# Patient Record
Sex: Female | Born: 1940 | Race: White | Hispanic: No | Marital: Married | Smoking: Former smoker
Health system: Southern US, Community
[De-identification: ages and names within clinical notes are randomized; demographics above are authoritative.]

## PROBLEM LIST (undated history)

## (undated) DIAGNOSIS — G5600 Carpal tunnel syndrome, unspecified upper limb: Secondary | ICD-10-CM

## (undated) DIAGNOSIS — G4700 Insomnia, unspecified: Secondary | ICD-10-CM

## (undated) DIAGNOSIS — F329 Major depressive disorder, single episode, unspecified: Secondary | ICD-10-CM

## (undated) DIAGNOSIS — J302 Other seasonal allergic rhinitis: Secondary | ICD-10-CM

## (undated) DIAGNOSIS — T7840XA Allergy, unspecified, initial encounter: Secondary | ICD-10-CM

## (undated) DIAGNOSIS — K219 Gastro-esophageal reflux disease without esophagitis: Secondary | ICD-10-CM

## (undated) DIAGNOSIS — N83209 Unspecified ovarian cyst, unspecified side: Secondary | ICD-10-CM

## (undated) DIAGNOSIS — K449 Diaphragmatic hernia without obstruction or gangrene: Secondary | ICD-10-CM

## (undated) DIAGNOSIS — D509 Iron deficiency anemia, unspecified: Secondary | ICD-10-CM

## (undated) DIAGNOSIS — J449 Chronic obstructive pulmonary disease, unspecified: Secondary | ICD-10-CM

## (undated) DIAGNOSIS — M8580 Other specified disorders of bone density and structure, unspecified site: Secondary | ICD-10-CM

## (undated) DIAGNOSIS — M1990 Unspecified osteoarthritis, unspecified site: Secondary | ICD-10-CM

## (undated) DIAGNOSIS — F32A Depression, unspecified: Secondary | ICD-10-CM

## (undated) DIAGNOSIS — E785 Hyperlipidemia, unspecified: Secondary | ICD-10-CM

## (undated) DIAGNOSIS — E559 Vitamin D deficiency, unspecified: Secondary | ICD-10-CM

## (undated) DIAGNOSIS — K5790 Diverticulosis of intestine, part unspecified, without perforation or abscess without bleeding: Secondary | ICD-10-CM

## (undated) DIAGNOSIS — F1010 Alcohol abuse, uncomplicated: Secondary | ICD-10-CM

## (undated) DIAGNOSIS — G2581 Restless legs syndrome: Secondary | ICD-10-CM

## (undated) DIAGNOSIS — R7303 Prediabetes: Secondary | ICD-10-CM

## (undated) DIAGNOSIS — R0600 Dyspnea, unspecified: Secondary | ICD-10-CM

## (undated) DIAGNOSIS — Z973 Presence of spectacles and contact lenses: Secondary | ICD-10-CM

## (undated) HISTORY — DX: Diverticulosis of intestine, part unspecified, without perforation or abscess without bleeding: K57.90

## (undated) HISTORY — PX: UPPER GI ENDOSCOPY: SHX6162

## (undated) HISTORY — DX: Other specified disorders of bone density and structure, unspecified site: M85.80

## (undated) HISTORY — DX: Alcohol abuse, uncomplicated: F10.10

## (undated) HISTORY — DX: Insomnia, unspecified: G47.00

## (undated) HISTORY — DX: Unspecified ovarian cyst, unspecified side: N83.209

## (undated) HISTORY — DX: Depression, unspecified: F32.A

## (undated) HISTORY — PX: BREAST LUMPECTOMY: SHX2

## (undated) HISTORY — DX: Vitamin D deficiency, unspecified: E55.9

## (undated) HISTORY — DX: Hyperlipidemia, unspecified: E78.5

## (undated) HISTORY — PX: APPENDECTOMY: SHX54

## (undated) HISTORY — PX: TONSILLECTOMY: SUR1361

## (undated) HISTORY — PX: KNEE ARTHROPLASTY: SHX992

## (undated) HISTORY — DX: Iron deficiency anemia, unspecified: D50.9

## (undated) HISTORY — PX: CARPAL TUNNEL RELEASE: SHX101

## (undated) HISTORY — DX: Allergy, unspecified, initial encounter: T78.40XA

## (undated) HISTORY — DX: Major depressive disorder, single episode, unspecified: F32.9

## (undated) HISTORY — PX: UPPER GASTROINTESTINAL ENDOSCOPY: SHX188

## (undated) HISTORY — PX: COLONOSCOPY: SHX174

## (undated) HISTORY — DX: Carpal tunnel syndrome, unspecified upper limb: G56.00

## (undated) HISTORY — PX: MASTECTOMY: SHX3

---
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
## 1997-11-20 ENCOUNTER — Other Ambulatory Visit: Admission: RE | Admit: 1997-11-20 | Discharge: 1997-11-20 | Payer: Self-pay | Admitting: Family Medicine

## 1997-11-21 ENCOUNTER — Other Ambulatory Visit: Admission: RE | Admit: 1997-11-21 | Discharge: 1997-11-21 | Payer: Self-pay | Admitting: Family Medicine

## 1997-11-22 ENCOUNTER — Other Ambulatory Visit: Admission: RE | Admit: 1997-11-22 | Discharge: 1997-11-22 | Payer: Self-pay | Admitting: Family Medicine

## 1998-06-09 ENCOUNTER — Ambulatory Visit (HOSPITAL_COMMUNITY): Admission: RE | Admit: 1998-06-09 | Discharge: 1998-06-09 | Payer: Self-pay | Admitting: Gastroenterology

## 1999-11-21 ENCOUNTER — Emergency Department (HOSPITAL_COMMUNITY): Admission: EM | Admit: 1999-11-21 | Discharge: 1999-11-21 | Payer: Self-pay | Admitting: Emergency Medicine

## 1999-11-22 ENCOUNTER — Other Ambulatory Visit (HOSPITAL_COMMUNITY): Admission: RE | Admit: 1999-11-22 | Discharge: 1999-12-19 | Payer: Self-pay | Admitting: Psychiatry

## 2000-04-24 ENCOUNTER — Encounter: Payer: Self-pay | Admitting: Emergency Medicine

## 2000-04-24 ENCOUNTER — Emergency Department (HOSPITAL_COMMUNITY): Admission: EM | Admit: 2000-04-24 | Discharge: 2000-04-24 | Payer: Self-pay | Admitting: Emergency Medicine

## 2001-10-03 ENCOUNTER — Encounter: Payer: Self-pay | Admitting: Internal Medicine

## 2001-10-03 ENCOUNTER — Encounter: Admission: RE | Admit: 2001-10-03 | Discharge: 2001-10-03 | Payer: Self-pay | Admitting: Internal Medicine

## 2001-11-01 ENCOUNTER — Encounter: Payer: Self-pay | Admitting: Orthopedic Surgery

## 2001-11-01 ENCOUNTER — Encounter: Admission: RE | Admit: 2001-11-01 | Discharge: 2001-11-01 | Payer: Self-pay | Admitting: Orthopedic Surgery

## 2002-01-09 ENCOUNTER — Encounter: Payer: Self-pay | Admitting: Orthopedic Surgery

## 2002-01-09 ENCOUNTER — Encounter: Admission: RE | Admit: 2002-01-09 | Discharge: 2002-01-09 | Payer: Self-pay | Admitting: Orthopedic Surgery

## 2002-05-13 ENCOUNTER — Encounter: Admission: RE | Admit: 2002-05-13 | Discharge: 2002-05-13 | Payer: Self-pay | Admitting: Internal Medicine

## 2002-05-13 ENCOUNTER — Encounter: Payer: Self-pay | Admitting: Internal Medicine

## 2003-01-23 ENCOUNTER — Ambulatory Visit (HOSPITAL_COMMUNITY): Admission: RE | Admit: 2003-01-23 | Discharge: 2003-01-23 | Payer: Self-pay | Admitting: Gastroenterology

## 2003-07-04 HISTORY — PX: KNEE SURGERY: SHX244

## 2004-07-03 HISTORY — PX: BUNIONECTOMY: SHX129

## 2004-08-01 ENCOUNTER — Other Ambulatory Visit: Admission: RE | Admit: 2004-08-01 | Discharge: 2004-08-01 | Payer: Self-pay | Admitting: Internal Medicine

## 2004-08-02 ENCOUNTER — Encounter: Admission: RE | Admit: 2004-08-02 | Discharge: 2004-08-02 | Payer: Self-pay | Admitting: Internal Medicine

## 2004-08-18 ENCOUNTER — Encounter: Admission: RE | Admit: 2004-08-18 | Discharge: 2004-08-18 | Payer: Self-pay | Admitting: Internal Medicine

## 2005-07-07 ENCOUNTER — Encounter: Admission: RE | Admit: 2005-07-07 | Discharge: 2005-07-07 | Payer: Self-pay | Admitting: Internal Medicine

## 2005-08-01 ENCOUNTER — Other Ambulatory Visit: Admission: RE | Admit: 2005-08-01 | Discharge: 2005-08-01 | Payer: Self-pay | Admitting: Internal Medicine

## 2005-08-02 ENCOUNTER — Emergency Department (HOSPITAL_COMMUNITY): Admission: EM | Admit: 2005-08-02 | Discharge: 2005-08-02 | Payer: Self-pay | Admitting: Emergency Medicine

## 2006-11-16 ENCOUNTER — Encounter: Admission: RE | Admit: 2006-11-16 | Discharge: 2006-11-16 | Payer: Self-pay | Admitting: *Deleted

## 2007-12-31 ENCOUNTER — Other Ambulatory Visit: Admission: RE | Admit: 2007-12-31 | Discharge: 2007-12-31 | Payer: Self-pay | Admitting: Family Medicine

## 2008-01-07 ENCOUNTER — Encounter: Admission: RE | Admit: 2008-01-07 | Discharge: 2008-01-07 | Payer: Self-pay | Admitting: Family Medicine

## 2009-01-28 ENCOUNTER — Encounter: Admission: RE | Admit: 2009-01-28 | Discharge: 2009-01-28 | Payer: Self-pay | Admitting: Family Medicine

## 2009-02-02 ENCOUNTER — Ambulatory Visit (HOSPITAL_COMMUNITY): Payer: Self-pay | Admitting: Licensed Clinical Social Worker

## 2009-02-10 ENCOUNTER — Ambulatory Visit (HOSPITAL_COMMUNITY): Payer: Self-pay | Admitting: Licensed Clinical Social Worker

## 2009-02-17 ENCOUNTER — Ambulatory Visit (HOSPITAL_COMMUNITY): Payer: Self-pay | Admitting: Licensed Clinical Social Worker

## 2009-03-05 ENCOUNTER — Ambulatory Visit (HOSPITAL_COMMUNITY): Payer: Self-pay | Admitting: Licensed Clinical Social Worker

## 2009-04-12 ENCOUNTER — Ambulatory Visit (HOSPITAL_COMMUNITY): Payer: Self-pay | Admitting: Licensed Clinical Social Worker

## 2009-05-17 ENCOUNTER — Ambulatory Visit (HOSPITAL_COMMUNITY): Payer: Self-pay | Admitting: Licensed Clinical Social Worker

## 2009-06-02 ENCOUNTER — Ambulatory Visit (HOSPITAL_COMMUNITY): Payer: Self-pay | Admitting: Licensed Clinical Social Worker

## 2009-06-30 ENCOUNTER — Emergency Department (HOSPITAL_COMMUNITY): Admission: EM | Admit: 2009-06-30 | Discharge: 2009-06-30 | Payer: Self-pay | Admitting: Emergency Medicine

## 2009-07-07 ENCOUNTER — Ambulatory Visit (HOSPITAL_BASED_OUTPATIENT_CLINIC_OR_DEPARTMENT_OTHER): Admission: RE | Admit: 2009-07-07 | Discharge: 2009-07-07 | Payer: Self-pay | Admitting: Orthopedic Surgery

## 2009-07-07 HISTORY — PX: ANKLE SURGERY: SHX546

## 2009-09-21 ENCOUNTER — Encounter: Admission: RE | Admit: 2009-09-21 | Discharge: 2009-09-21 | Payer: Self-pay | Admitting: *Deleted

## 2010-01-24 ENCOUNTER — Emergency Department (HOSPITAL_COMMUNITY): Admission: EM | Admit: 2010-01-24 | Discharge: 2010-01-24 | Payer: Self-pay | Admitting: Family Medicine

## 2010-01-25 ENCOUNTER — Encounter: Admission: RE | Admit: 2010-01-25 | Discharge: 2010-01-25 | Payer: Self-pay | Admitting: Gastroenterology

## 2010-06-15 ENCOUNTER — Emergency Department (HOSPITAL_COMMUNITY)
Admission: EM | Admit: 2010-06-15 | Discharge: 2010-06-15 | Payer: Self-pay | Source: Home / Self Care | Admitting: Emergency Medicine

## 2010-07-24 ENCOUNTER — Encounter: Payer: Self-pay | Admitting: Internal Medicine

## 2010-09-17 LAB — COMPREHENSIVE METABOLIC PANEL
Alkaline Phosphatase: 62 U/L (ref 39–117)
Calcium: 8.7 mg/dL (ref 8.4–10.5)
GFR calc non Af Amer: >60 mL/min (ref >60)
Glucose, Bld: 117 mg/dL — ABNORMAL HIGH (ref 70–99)
Potassium: 3.4 mEq/L — ABNORMAL LOW (ref 3.5–5.1)
Total Bilirubin: 0.4 mg/dL (ref 0.3–1.2)

## 2010-09-17 LAB — DIFFERENTIAL
Eosinophils Absolute: 0.1 10*3/uL (ref 0.0–0.7)
Lymphocytes Relative: 27 % (ref 12–46)
Monocytes Relative: 12 % (ref 3–12)
Neutrophils Relative %: 59 % (ref 43–77)

## 2010-09-17 LAB — CBC
HCT: 37.2 % (ref 36.0–46.0)
MCH: 29.6 pg (ref 26.0–34.0)
MCHC: 33.1 g/dL (ref 30.0–36.0)
RBC: 4.16 MIL/uL (ref 3.87–5.11)
RDW: 13.3 % (ref 11.5–15.5)

## 2010-09-17 LAB — POCT URINALYSIS DIP (DEVICE)
Protein, ur: 30 mg/dL — AB
Specific Gravity, Urine: >=1.03 (ref 1.005–1.030)
Urobilinogen, UA: 0.2 mg/dL (ref 0.0–1.0)
pH: 5.5 (ref 5.0–8.0)

## 2010-09-17 LAB — CLOSTRIDIUM DIFFICILE EIA

## 2010-09-17 LAB — STOOL CULTURE

## 2010-09-17 LAB — GIARDIA/CRYPTOSPORIDIUM SCREEN(EIA): Giardia Screen - EIA: NEGATIVE

## 2010-12-02 HISTORY — PX: OTHER SURGICAL HISTORY: SHX169

## 2011-07-31 ENCOUNTER — Other Ambulatory Visit: Payer: Self-pay | Admitting: Family Medicine

## 2011-07-31 DIAGNOSIS — Z1231 Encounter for screening mammogram for malignant neoplasm of breast: Secondary | ICD-10-CM

## 2011-08-03 ENCOUNTER — Encounter (HOSPITAL_COMMUNITY): Payer: Self-pay

## 2011-08-03 ENCOUNTER — Emergency Department (INDEPENDENT_AMBULATORY_CARE_PROVIDER_SITE_OTHER)
Admission: EM | Admit: 2011-08-03 | Discharge: 2011-08-03 | Disposition: A | Discharge status: Home / Self Care | Payer: Medicare Other | Source: Home / Self Care

## 2011-08-03 DIAGNOSIS — K122 Cellulitis and abscess of mouth: Secondary | ICD-10-CM

## 2011-08-03 HISTORY — DX: Diaphragmatic hernia without obstruction or gangrene: K44.9

## 2011-08-03 HISTORY — DX: Other seasonal allergic rhinitis: J30.2

## 2011-08-03 HISTORY — DX: Restless legs syndrome: G25.81

## 2011-08-03 HISTORY — DX: Unspecified osteoarthritis, unspecified site: M19.90

## 2011-08-03 LAB — POCT RAPID STREP A: Streptococcus, Group A Screen (Direct): NEGATIVE

## 2011-08-03 MED ORDER — MAGIC MOUTHWASH W/LIDOCAINE: ORAL | Status: DC

## 2011-08-03 MED ORDER — AMOXICILLIN 875 MG PO TABS: 875.0000 mg | ORAL_TABLET | Freq: Two times a day (BID) | ORAL | Status: AC

## 2011-08-03 NOTE — ED Notes (Signed)
C/o swelling, soreness and burning of throat that started around 2 am today.

## 2011-08-03 NOTE — ED Provider Notes (Signed)
History     CSN: [id number]  Arrival date & time 08/03/11  0810   None     Chief Complaint  Patient presents with  . Sore Throat    (Consider location/radiation/quality/duration/timing/severity/associated sxs/prior treatment) HPI Comments: Onset of sore throat last night. Uvula feels swollen. No nasal congestion, but does have problems chronically with post nasal drainage, and reflux.    Past Medical History  Diagnosis Date  . Arthritis   . Restless leg syndrome   . Seasonal allergies   . Hiatal hernia     Past Surgical History  Procedure Date  . Ankle surgery   . Shoulder surgery   . Knee surgery   . Tonsillectomy     No family history on file.  History  Substance Use Topics  . Smoking status: Never Smoker   . Smokeless tobacco: Not on file  . Alcohol Use: No    OB History    Grav Para Term Preterm Abortions TAB SAB Ect Mult Living                  Review of Systems  Constitutional: Negative for fever, chills and fatigue.  HENT: Positive for sore throat. Negative for ear pain, rhinorrhea, postnasal drip and sinus pressure.   Respiratory: Negative for cough, shortness of breath and wheezing.   Cardiovascular: Negative for chest pain.    Allergies  Sulfa antibiotics  Home Medications   Current Outpatient Rx  Name Route Sig Dispense Refill  . ZYRTEC PO Oral Take by mouth.    . IRON PO Oral Take by mouth.    . REQUIP PO Oral Take by mouth.    . SERTRALINE HCL PO Oral Take by mouth.    Marland Kitchen MAGIC MOUTHWASH W/LIDOCAINE  Gargle and spit 5 ml every 4 hrs as needed 60 mL 0  . AMOXICILLIN 875 MG PO TABS Oral Take 1 tablet (875 mg total) by mouth 2 (two) times daily. 20 tablet 0    BP 113/78  Pulse 65  Temp(Src) 98.5 F (36.9 C) (Oral)  Resp 17  SpO2 96%  Physical Exam  Nursing note and vitals reviewed. Constitutional: She appears well-developed and well-nourished. No distress.  HENT:  Head: Normocephalic and atraumatic.  Right Ear: Tympanic  membrane, external ear and ear canal normal.  Left Ear: Tympanic membrane, external ear and ear canal normal.  Nose: Nose normal.  Mouth/Throat: Uvula is midline and mucous membranes are normal. Posterior oropharyngeal edema and posterior oropharyngeal erythema present. No oropharyngeal exudate or tonsillar abscesses.    Neck: Neck supple.  Cardiovascular: Normal rate, regular rhythm and normal heart sounds.   Pulmonary/Chest: Effort normal and breath sounds normal. No respiratory distress.  Lymphadenopathy:    She has no cervical adenopathy.  Neurological: She is alert.  Skin: Skin is warm and dry.  Psychiatric: She has a normal mood and affect.    ED Course  Procedures (including critical care time)   Labs Reviewed  POCT RAPID STREP A ([facility] CARE ONLY)   No results found.   1. Uvulitis       MDM  Rapid strep neg.         Melody Comas, [place] 08/03/11 [phone]

## 2011-08-10 ENCOUNTER — Ambulatory Visit
Admission: RE | Admit: 2011-08-10 | Discharge: 2011-08-10 | Disposition: A | Discharge status: Home / Self Care | Payer: Medicare Other | Source: Ambulatory Visit | Attending: Family Medicine | Admitting: Family Medicine

## 2011-08-10 DIAGNOSIS — Z1231 Encounter for screening mammogram for malignant neoplasm of breast: Secondary | ICD-10-CM

## 2011-08-10 NOTE — ED Provider Notes (Signed)
Medical screening examination/treatment/procedure(s) were performed by non-physician practitioner and as supervising physician I was immediately available for consultation/collaboration.  Alegria Dominique G  D.O.    Randa Spike, MD 08/10/11 [phone]

## 2011-11-14 ENCOUNTER — Other Ambulatory Visit: Payer: Self-pay | Admitting: Gastroenterology

## 2011-11-16 ENCOUNTER — Other Ambulatory Visit (HOSPITAL_COMMUNITY): Payer: Self-pay | Admitting: Gastroenterology

## 2011-11-21 ENCOUNTER — Ambulatory Visit
Admission: RE | Admit: 2011-11-21 | Discharge: 2011-11-21 | Disposition: A | Discharge status: Home / Self Care | Payer: Medicare Other | Source: Ambulatory Visit | Attending: Gastroenterology | Admitting: Gastroenterology

## 2011-11-21 ENCOUNTER — Other Ambulatory Visit: Payer: Self-pay | Admitting: Family Medicine

## 2011-11-21 DIAGNOSIS — R05 Cough: Secondary | ICD-10-CM

## 2011-11-21 DIAGNOSIS — R059 Cough, unspecified: Secondary | ICD-10-CM

## 2011-11-29 ENCOUNTER — Ambulatory Visit (HOSPITAL_COMMUNITY)
Admission: RE | Admit: 2011-11-29 | Discharge: 2011-11-29 | Disposition: A | Discharge status: Home / Self Care | Payer: Medicare Other | Source: Ambulatory Visit | Attending: Gastroenterology | Admitting: Gastroenterology

## 2011-11-29 DIAGNOSIS — R1310 Dysphagia, unspecified: Secondary | ICD-10-CM | POA: Insufficient documentation

## 2011-11-29 NOTE — Procedures (Signed)
Objective Swallowing Evaluation: Modified Barium Swallowing Study  Patient Details  Name: Lindsay Porter MRN: [id number] Date of Birth: 04-Dec-1940  Today's Date: 11/29/2011 Time: 1010-1040 SLP Time Calculation (min): 30 min  Past Medical History:  Past Medical History  Diagnosis Date  . Arthritis   . Restless leg syndrome   . Seasonal allergies   . Hiatal hernia    Past Surgical History:  Past Surgical History  Procedure Date  . Ankle surgery   . Shoulder surgery   . Knee surgery   . Tonsillectomy    HPI:  71 yo female referred by Dr Matthias Hughs for concerns pt may be aspirating due to cough with intake.  Pt reports dysphagia xyears causing her to choke and cough with eating, pt states she even has problems swallowing saliva.  Pt reports coughing and choking with every meal.  Most difficult time of day to eat is in morning.  Sensation of stasis causing pain reported with patient pointing to pharynx and proximal esophagus, requiring pt to wait for it to pass.  Water and saliva/secretions are reportedly difficult for pt to swallow, causing pt to cough and become a "phlegm factory".   Constant sore throat also reported.  Recent PPI prescribed per pt with improvement in nightime heartburn, pt reports she has never taken a PPI before.  PMH (per pt) also + for smoking (quit 4 years ago), allergies, bronchitis Jan 2013 that has now revolved into a chronic cough, RLS (which she states is getting worse) .  Weight has reportedly been constant and she has not ever required the heimlich manuever per pt.         Assessment / Plan / Recommendation Clinical Impression  Dysphagia Diagnosis: Suspected primary esophageal dysphagia;Within Functional Limits Clinical impression: Pt presents with a functional oropharyngeal swallow without aspiration or penetration of any consistency tested.   Suspect pt has a primary esopahgeal dysphagia.  Pt reported sensation of stasis in pharynx and/or "tightness" when  pharynx was completely clear.  Suspect "tightness" may be due to cricopharyngeal hypertension to compensate for known esophageal issues.  Pt appeared  with stasis throughout esophagus (to near T4 region) that did not clear during entire evaluation.  Barium tablet lodged at appearance of narrowing at distal esophagus and did not clear with further boluses of thin barium, pudding, or water.   Pt sensed stasis and  reported fullness and pain at that time, which would cause her to stop intake if consuming po at home.  SLP advised pt to drink warm liquids and that tablet would dissolve.  Pt's aspiration risk appears to be due to known esophageal dysphagia.      Treatment Recommendation       Diet Recommendation  (defer to GI)   Other  Recommendations     Follow Up Recommendations  None    Frequency and Duration        Pertinent Vitals/Pain n/a    SLP Swallow Goals     General Date of Onset: 11/29/11 HPI: 71 yo female referred by Dr Matthias Hughs for concerns pt may be aspirating due to cough with intake.  Pt reports dysphagia xyears causing her to choke and cough with eating, pt states she even has problems swallowing saliva.  Pt reports coughing and choking with every meal.  Most difficult time of day to eat is in morning.  Sensation of stasis causing pain reported with patient pointing to pharynx and proximal esophagus, requiring pt to wait for it to pass.  Water and saliva/secretions are reportedly difficult for pt to swallow, causing pt to cough and become a "phlegm factory".   Constant sore throat also reported.  Recent PPI prescribed per pt with improvement in nightime heartburn, pt reports she has never taken a PPI before.  PMH (per pt) also + for smoking (quit 4 years ago), allergies, bronchitis Jan 2013 that has now revolved into a chronic cough, RLS (which she states is getting worse) .  Weight has reportedly been constant and she has not ever required the heimlich manuever per pt.      Type of  Study: Modified Barium Swallowing Study Previous Swallow Assessment: Esophagram 11/21/11:  significant esophageal dysmotility with spasm distally and standing wave contractions, large hiatal hernia with organoaxial volvulus, prominent cp impression but no Zenker's   Esophagram 01/25/10 moderate sized hiatal hernia, barium tablet lodged temporarily in distal esophagus but did pass into hernia without delay, food debris does pass from hernia into a more distal stomach without evidence of significant obstruction, moderate tertiary contractions in distal esophagus, slight narrowing of lower cervical esophagus without defiinite stricture.   Diet Prior to this Study: Regular;Thin liquids Temperature Spikes Noted: No Respiratory Status: Room air History of Intubation: No Behavior/Cognition: Alert;Cooperative;Pleasant mood Oral Cavity - Dentition: Adequate natural dentition Oral Motor / Sensory Function: Within functional limits Vision: Functional for self-feeding Patient Positioning: Upright in chair Baseline Vocal Quality: Clear Volitional Cough: Strong Anatomy: Within functional limits Pharyngeal Secretions: Not observed secondary MBS    Reason for Referral Concern for aspiration  Oral Phase   Oral Phase:  WFL  Pharyngeal Phase Pharyngeal Phase: Within functional limits   Cervical Esophageal Phase Cervical Esophageal Phase: Impaired    Chales Abrahams 11/29/2011, 1:01 PM

## 2011-12-04 ENCOUNTER — Encounter: Payer: Self-pay | Admitting: Pulmonary Disease

## 2011-12-04 DIAGNOSIS — M1990 Unspecified osteoarthritis, unspecified site: Secondary | ICD-10-CM | POA: Insufficient documentation

## 2011-12-04 DIAGNOSIS — K5790 Diverticulosis of intestine, part unspecified, without perforation or abscess without bleeding: Secondary | ICD-10-CM | POA: Insufficient documentation

## 2011-12-04 DIAGNOSIS — F32A Depression, unspecified: Secondary | ICD-10-CM | POA: Insufficient documentation

## 2011-12-04 DIAGNOSIS — M8580 Other specified disorders of bone density and structure, unspecified site: Secondary | ICD-10-CM | POA: Insufficient documentation

## 2011-12-04 DIAGNOSIS — G5600 Carpal tunnel syndrome, unspecified upper limb: Secondary | ICD-10-CM | POA: Insufficient documentation

## 2011-12-04 DIAGNOSIS — F329 Major depressive disorder, single episode, unspecified: Secondary | ICD-10-CM | POA: Insufficient documentation

## 2011-12-04 DIAGNOSIS — E559 Vitamin D deficiency, unspecified: Secondary | ICD-10-CM | POA: Insufficient documentation

## 2011-12-04 DIAGNOSIS — K449 Diaphragmatic hernia without obstruction or gangrene: Secondary | ICD-10-CM | POA: Insufficient documentation

## 2011-12-04 DIAGNOSIS — G4700 Insomnia, unspecified: Secondary | ICD-10-CM | POA: Insufficient documentation

## 2011-12-04 DIAGNOSIS — E785 Hyperlipidemia, unspecified: Secondary | ICD-10-CM | POA: Insufficient documentation

## 2011-12-04 DIAGNOSIS — F1010 Alcohol abuse, uncomplicated: Secondary | ICD-10-CM | POA: Insufficient documentation

## 2011-12-04 DIAGNOSIS — D509 Iron deficiency anemia, unspecified: Secondary | ICD-10-CM | POA: Insufficient documentation

## 2011-12-04 DIAGNOSIS — J302 Other seasonal allergic rhinitis: Secondary | ICD-10-CM | POA: Insufficient documentation

## 2011-12-04 DIAGNOSIS — G2581 Restless legs syndrome: Secondary | ICD-10-CM | POA: Insufficient documentation

## 2011-12-05 ENCOUNTER — Encounter: Payer: Self-pay | Admitting: Pulmonary Disease

## 2011-12-05 ENCOUNTER — Ambulatory Visit (INDEPENDENT_AMBULATORY_CARE_PROVIDER_SITE_OTHER): Payer: Medicare Other | Admitting: Pulmonary Disease

## 2011-12-05 VITALS — BP 108/78 | HR 68 | Temp 98.2°F | Ht 65.0 in | Wt 160.2 lb

## 2011-12-05 DIAGNOSIS — R0989 Other specified symptoms and signs involving the circulatory and respiratory systems: Secondary | ICD-10-CM

## 2011-12-05 DIAGNOSIS — J449 Chronic obstructive pulmonary disease, unspecified: Secondary | ICD-10-CM | POA: Insufficient documentation

## 2011-12-05 DIAGNOSIS — R0600 Dyspnea, unspecified: Secondary | ICD-10-CM

## 2011-12-05 NOTE — Assessment & Plan Note (Signed)
The patient is describing dyspnea that primarily occurs while sitting, talking, lying down, and bending over.  She otherwise has fairly good exertional tolerance for walking and also working out in the gym.  This would be most consistent with some type of restrictive process such as her centripetal obesity and large hiatal hernia in her chest.  She does have a long-standing history of smoking, and may have a component of COPD.  She has never had pulmonary function studies, and will arrange for her to have these.  We'll see her back after her PFTs.  Ultimately, her symptoms of cough and dyspnea may resolve after her hiatal hernia repair?

## 2011-12-05 NOTE — Patient Instructions (Signed)
No change in breathing medications for now Will set up for breathing studies next week to see how much COPD you may have, and also to evaluate for restrictive disease as we discussed. I suspect your cough is coming from reflux/hiatal hernia, and would keep followup with GI.

## 2011-12-05 NOTE — Progress Notes (Signed)
Addended by: Michel Bickers A on: 12/05/2011 03:53 PM   Modules accepted: Orders

## 2011-12-05 NOTE — Progress Notes (Signed)
  Subjective:    Patient ID: Lindsay Porter, female    DOB: [birth year], 71 y.o.   MRN: [id number]  HPI The patient is a 71 year old female who I've been asked to see for dyspnea on exertion.  She has noted shortness of breath for at least the last 3 years, and thinks that it is getting worse.  This primarily occurs while sitting and trying to talk, or after lying down.  It also occurs frequently when bending over to pick up something or tie shoes.  Contrary to this, the patient feels that she can walk moderate distances without any significant shortness of breath.  She will not get winded walking up a flight of stairs, perking groceries in from the car, or doing her housework.  She currently is on symbicort and Singulair, but has never had pulmonary function studies.  She has had a recent chest x-ray that shows a large hiatal hernia and minimal scarring in the bases.  This was followed up with an esophagram which showed esophageal dysmotility and a large hiatal hernia.  There was no aspiration per speech evaluation.  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  She is being followed closely by gastroenterology, with a pending decision regarding possible surgery.   Review of Systems  Constitutional: Negative.  Negative for fever and unexpected weight change.  HENT: Positive for sore throat and trouble swallowing. Negative for ear pain, nosebleeds, congestion, rhinorrhea, sneezing, dental problem, postnasal drip and sinus pressure.   Eyes: Negative.  Negative for redness and itching.  Respiratory: Positive for cough and shortness of breath. Negative for chest tightness and wheezing.   Cardiovascular: Positive for chest pain. Negative for palpitations and leg swelling.  Gastrointestinal: Negative for nausea and vomiting.       Heartburn   Genitourinary: Negative.  Negative for dysuria.  Musculoskeletal:  Positive for arthralgias. Negative for joint swelling.  Skin: Positive for rash.       Itching   Neurological: Positive for headaches.  Hematological: Does not bruise/bleed easily.  Psychiatric/Behavioral: Negative.  Negative for dysphoric mood. The patient is not nervous/anxious.        Objective:   Physical Exam Constitutional:  Well developed, no acute distress  HENT:  Nares patent without discharge, but septal deviation to left with narrowing.   Oropharynx without exudate, palate and uvula are normal  Eyes:  Perrla, eomi, no scleral icterus  Neck:  No JVD, no TMG  Cardiovascular:  Normal rate, regular rhythm, no rubs or gallops.  No murmurs        Intact distal pulses  Pulmonary :  Normal breath sounds, no stridor or respiratory distress   No rales, rhonchi, or wheezing  Abdominal:  Soft, nondistended, bowel sounds present.  No tenderness noted.   Musculoskeletal:  Minimal lower extremity edema noted.  Lymph Nodes:  No cervical lymphadenopathy noted  Skin:  No cyanosis noted  Neurologic:  Alert, appropriate, moves all 4 extremities without obvious deficit.         Assessment & Plan:

## 2011-12-06 ENCOUNTER — Institutional Professional Consult (permissible substitution): Payer: Medicare Other | Admitting: Internal Medicine

## 2011-12-14 ENCOUNTER — Ambulatory Visit (INDEPENDENT_AMBULATORY_CARE_PROVIDER_SITE_OTHER): Payer: Medicare Other | Admitting: Pulmonary Disease

## 2011-12-14 DIAGNOSIS — R0989 Other specified symptoms and signs involving the circulatory and respiratory systems: Secondary | ICD-10-CM

## 2011-12-14 DIAGNOSIS — R0600 Dyspnea, unspecified: Secondary | ICD-10-CM

## 2011-12-14 LAB — PULMONARY FUNCTION TEST

## 2011-12-14 NOTE — Progress Notes (Signed)
PFT done today. 

## 2011-12-18 ENCOUNTER — Telehealth: Payer: Self-pay | Admitting: Pulmonary Disease

## 2011-12-18 NOTE — Telephone Encounter (Signed)
Per KC okay to use RN slot for pt. lmomtcb x1 for pt to get pt scheduled for OV with [facility]

## 2011-12-18 NOTE — Telephone Encounter (Signed)
Pt is requesting her PFT results from 12/14/11. Please advise KC thanks

## 2011-12-18 NOTE — Telephone Encounter (Signed)
Let pt know that she has moderate copd by her breathing studies, and I would like for her to come in to discuss treatment and get started on samples.

## 2011-12-19 NOTE — Telephone Encounter (Signed)
Pt returned call.  Advised of PFT results and KC's recs to come in for ov to discuss treatment and start samples.  OV with KC 6.28.13 @ 4:15pm.  Pt okay with this date and time.  Pt to call sooner if needed.  Will sign off.

## 2011-12-29 ENCOUNTER — Ambulatory Visit (INDEPENDENT_AMBULATORY_CARE_PROVIDER_SITE_OTHER): Payer: Medicare Other | Admitting: Pulmonary Disease

## 2011-12-29 ENCOUNTER — Encounter: Payer: Self-pay | Admitting: Pulmonary Disease

## 2011-12-29 VITALS — BP 104/66 | HR 66 | Temp 97.8°F | Ht 65.0 in | Wt 162.0 lb

## 2011-12-29 DIAGNOSIS — J449 Chronic obstructive pulmonary disease, unspecified: Secondary | ICD-10-CM

## 2011-12-29 NOTE — Assessment & Plan Note (Signed)
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  I've had a long discussion with her and her family regarding the pathophysiology of emphysema, and how we typically approach this disease process.  She is on a good bronchodilator currently, and seems to have had improvement with this.  I discussed escalation of therapy with her if she does not feel that she is satisfied with her quality of life/exertional tolerance.  If this is the case, I would add Spiriva to her regimen.  I also discussed with her the role of weight loss and conditioning, as well as vaccines.  It is unclear to me how much of her chest pressure is secondary to air trapping from her COPD, versus her hiatal hernia and reflux.

## 2011-12-29 NOTE — Progress Notes (Signed)
  Subjective:    Patient ID: Lindsay Porter, female    DOB: 1941-05-24, 71 y.o.   MRN: [id number]  HPI The patient comes in today for followup after her pulmonary function studies for evaluation of dyspnea and chest fullness.  She was found to have moderate airflow obstruction, positive air trapping, no restriction, and a moderate decrease in diffusion capacity.  I have reviewed this study with her in great detail and her family as well, and answered all their questions.   Review of Systems  Constitutional: Negative for fever and unexpected weight change.  HENT: Positive for voice change. Negative for ear pain, nosebleeds, congestion, sore throat, rhinorrhea, sneezing, trouble swallowing, dental problem, postnasal drip and sinus pressure.        Hoarseness  Eyes: Negative for redness and itching.  Respiratory: Negative for cough, chest tightness, shortness of breath and wheezing.   Cardiovascular: Negative for palpitations and leg swelling.  Gastrointestinal: Negative for nausea and vomiting.  Genitourinary: Negative for dysuria.  Musculoskeletal: Negative for joint swelling.  Skin: Negative for rash.  Neurological: Negative for headaches.  Hematological: Does not bruise/bleed easily.  Psychiatric/Behavioral: Negative for dysphoric mood. The patient is not nervous/anxious.   All other systems reviewed and are negative.       Objective:   Physical Exam Overweight female in no acute distress Nose without  Purulence or discharge noted Chest with mildly decreased breath sounds, but no wheezing Lower extremities with no significant edema, no cyanosis Alert and oriented, moves all 4 extremities.       Assessment & Plan:

## 2011-12-29 NOTE — Patient Instructions (Addendum)
Stay on symbicort twice a day, and keep mouth rinsed. Work on [employer] reduction and conditioning program.  If you feel it is difficult on your own, consider referral to pulmonary rehab program at [facility]. Make sure you get your flu shot this fall. followup with me in 6mos to check on your progress.

## 2012-01-12 ENCOUNTER — Encounter: Payer: Self-pay | Admitting: Pulmonary Disease

## 2012-03-15 ENCOUNTER — Other Ambulatory Visit: Payer: Self-pay | Admitting: Family Medicine

## 2012-03-15 DIAGNOSIS — Z1231 Encounter for screening mammogram for malignant neoplasm of breast: Secondary | ICD-10-CM

## 2012-03-15 DIAGNOSIS — Z780 Asymptomatic menopausal state: Secondary | ICD-10-CM

## 2012-03-21 ENCOUNTER — Ambulatory Visit (INDEPENDENT_AMBULATORY_CARE_PROVIDER_SITE_OTHER): Payer: Medicare Other | Admitting: General Surgery

## 2012-03-26 ENCOUNTER — Ambulatory Visit (INDEPENDENT_AMBULATORY_CARE_PROVIDER_SITE_OTHER): Payer: Medicare Other | Admitting: General Surgery

## 2012-03-26 VITALS — BP 110/72 | HR 72 | Temp 97.8°F | Resp 18 | Ht 65.5 in | Wt 157.8 lb

## 2012-03-26 DIAGNOSIS — K449 Diaphragmatic hernia without obstruction or gangrene: Secondary | ICD-10-CM

## 2012-03-26 NOTE — Progress Notes (Signed)
Subjective:   hiatal hernia, dysphagia, shortness of breath  Patient ID: Lindsay Porter, female   DOB: 11/18/1940, 71 y.o.   MRN: [id number]  HPI  Patient is a very pleasant 71-year-old female referred by Dr. Buccini for consideration for surgical repair of a large hiatal hernia. The patient states that she has had a known hiatal hernia for approximately 20 years. She initially had some chest pain and underwent a cardiac workup years ago and then GI workup which showed a hiatal hernia. She initially had some reflux symptoms but these were controlled with medications. In recent years she has had increasing trouble with dysphagia. She describes frequent episodes where she needs a small amount and gets quite a bit of severe pressure and pain in her lower chest. She will sometimes have regurgitation. She also has had some increasing shortness of breath. She has seen Dr. Clance and has had pulmonary function studies and has been diagnosed with moderate COPD. She is an ex-smoker quit 4 years ago. Her shortness of breath by her history seems to occur lot when she bends over but not so much when she walks and she is able to walk a number of blocks. She does not really have any classic heartburn symptoms. She does have some chronic hoarseness and will often have a nighttime cough. She has been thoroughly evaluated by Dr. Buccini including upper endoscopy which showed a large hiatal hernia. She has had an upper GI series performed in May which I personally reviewed. This shows a large hiatal hernia with about half of the stomach above the diaphragm and organoaxial portion with significant twisting of the stomach and some pooling of contrast in the upper stomach. Also noted was the esophageal dysmotility. She recently has been on a combination of Protonix and Zegerid and has had some significant improvement in her symptoms but still bothering her.  Past Medical History  Diagnosis Date  . Arthritis   . Restless leg  syndrome   . Seasonal allergies   . Hiatal hernia   . Hyperlipidemia   . Insomnia   . Depression   . Diverticulosis   . Ovarian cyst   . Vitamin d deficiency   . Osteopenia   . Carpal tunnel syndrome   . Alcohol abuse   . Anemia, iron deficiency    Past Surgical History  Procedure Date  . Ankle surgery 07/07/2009  . Shoulder surgery   . Knee surgery   . Tonsillectomy   . Appendectomy   . Bunionectomy   . Breast lumpectomy   . Nose surgery 12/2010   Current Outpatient Prescriptions  Medication Sig Dispense Refill  . budesonide-formoterol (SYMBICORT) 80-4.5 MCG/ACT inhaler Inhale 2 puffs into the lungs 2 (two) times daily.      . diphenhydrAMINE (BENADRYL) 25 MG tablet Take 25 mg by mouth every 6 (six) hours as needed.      . EPINEPHrine (EPIPEN) 0.3 mg/0.3 mL DEVI Inject 0.3 mg into the muscle once.      . ergocalciferol (VITAMIN D2) 50000 UNITS capsule Take 50,000 Units by mouth once a week.      . fexofenadine (ALLEGRA) 180 MG tablet Take 180 mg by mouth daily.      . fluticasone (FLONASE) 50 MCG/ACT nasal spray 1 spray in each nostril daily      . IRON PO Take 325 mg by mouth daily.       . omeprazole-sodium bicarbonate (ZEGERID) 40-1100 MG per capsule 1 by mouth daily      .   pantoprazole (PROTONIX) 40 MG tablet Once daily      . ROPINIRole HCl (REQUIP PO) Take 2 mg by mouth 2 (two) times daily.       . SERTRALINE HCL PO Take 100 mg by mouth daily.        Allergies  Allergen Reactions  . Sulfa Antibiotics    History  Substance Use Topics  . Smoking status: Former Smoker -- 4.5 packs/day for 45 years    Types: Cigarettes    Quit date: 10/02/2007  . Smokeless tobacco: Never Used  . Alcohol Use: Yes     quit in 2011    Review of Systems  Respiratory: Positive for shortness of breath. Negative for wheezing and stridor.   Cardiovascular: Negative.   Gastrointestinal: Positive for diarrhea. Negative for nausea, abdominal pain, constipation, blood in stool and  abdominal distention.  Musculoskeletal: Positive for arthralgias.       Objective:   Physical Exam Exam BP 110/72  Pulse 72  Temp 97.8 F (36.6 C) (Temporal)  Resp 18  Ht 5' 5.5" (1.664 m)  Wt 157 lb 12.8 oz (71.578 kg)  BMI 25.86 kg/m2  General: Alert, well-developed Caucasian female, in no distress Skin: Warm and dry without rash or infection. HEENT: No palpable masses or thyromegaly. Sclera nonicteric. Pupils equal round and reactive. Oropharynx clear. Lymph nodes: No cervical, supraclavicular, or inguinal nodes palpable. Lungs:mild kyphosis there Breath sounds clear and equal, slightly decreased, without increased work of breathing Cardiovascular: Regular rate and rhythm without murmur. No JVD or edema. Peripheral pulses intact. Abdomen: Nondistended. Soft and nontender. No masses palpable. No organomegaly. No palpable hernias. Extremities: No edema or joint swelling or deformity. No chronic venous stasis changes. Neurologic: Alert and fully oriented. Gait normal.      Assessment:     71-year-old female with large hiatal hernia with organoaxial volvulus. She has had significant dysphagia symptoms as well as what sounds like some occasional nighttime aspiration. She does not have typical reflux symptoms. She has shortness of breath which seems to be more related to bending of exertional possibly related to abdominal pressure and contributed to by her hernia. She also has some degree of esophageal dysmotility on her upper GI series. All in all her hernia appears very significant and I think she could expect some significant symptom relief with repair of her hiatal hernia. The degree to which it would improve her dysphagia is a little bit in question because of her esophageal dysmotility and it is not exactly clear how much her COPD versus her hernia contributes to her shortness of breath. This was all discussed with her and her family. They would like to go ahead with repair and I  think this is very reasonable. I discussed laparoscopic repair. We discussed use of biologic mesh. Risks of surgery including visceral injury, anesthetic risks, bleeding, infection and long-term complications including recurrence, stricture and gas bloat were discussed. Due to her esophageal dysmotility and minimal reflux symptoms I would suggest a partial wrap.    Plan:     Laparoscopic repair of large hiatal hernia with partial fundoplication.      

## 2012-04-11 ENCOUNTER — Encounter (INDEPENDENT_AMBULATORY_CARE_PROVIDER_SITE_OTHER): Payer: Self-pay

## 2012-04-11 ENCOUNTER — Telehealth (INDEPENDENT_AMBULATORY_CARE_PROVIDER_SITE_OTHER): Payer: Self-pay

## 2012-04-11 ENCOUNTER — Encounter (HOSPITAL_COMMUNITY): Payer: Self-pay | Admitting: Pharmacy Technician

## 2012-04-11 NOTE — Telephone Encounter (Signed)
Pt called this morning with questions about her pain relievers.  She is currently being treated by a hand surgeon and is having a lot of pain and swelling in her hand.  She had a cortisone injection on 9/27, but states it hasn't helped very much.  She is concerned about taking any NSAIDS because she is scheduled for hernia repair on 04/19/30.  I told her she could take the Ibuprofen through this coming Saturday, and then switch to Tylenol until her surgery.

## 2012-04-15 NOTE — Patient Instructions (Signed)
22 Lindsay Porter  04/15/2012   Your procedure is scheduled on:  04/19/12 0730am-1000am  Report to [facility] at 0530 AM.  Call this number if you have problems the morning of surgery: [phone]   Remember:   Do not eat food:After Midnight.  May have clear liquids:until Midnight .    Take these medicines the morning of surgery with A SIP OF WATER:    Do not wear jewelry, make-up or nail polish.  Do not wear lotions, powders, or perfumes.   Do not shave 48 hours prior to surgery  Do not bring valuables to the hospital.  Contacts, dentures or bridgework may not be worn into surgery.  Leave suitcase in the car. After surgery it may be brought to your room.  For patients admitted to the hospital, checkout time is 11:00 AM the day of discharge.                SEE CHG INSTRUCTION SHEET    Please read over the following fact sheets that you were given: MRSA Information, coughing and deep breathin exercises, leg exercises

## 2012-04-16 ENCOUNTER — Encounter (HOSPITAL_COMMUNITY): Payer: Self-pay

## 2012-04-16 ENCOUNTER — Encounter (HOSPITAL_COMMUNITY)
Admission: RE | Admit: 2012-04-16 | Discharge: 2012-04-16 | Disposition: A | Discharge status: Home / Self Care | Payer: Medicare Other | Source: Ambulatory Visit | Attending: General Surgery | Admitting: General Surgery

## 2012-04-16 HISTORY — DX: Gastro-esophageal reflux disease without esophagitis: K21.9

## 2012-04-16 HISTORY — DX: Chronic obstructive pulmonary disease, unspecified: J44.9

## 2012-04-16 LAB — COMPREHENSIVE METABOLIC PANEL
ALT: 13 U/L (ref 0–35)
AST: 17 U/L (ref 0–37)
Albumin: 3.4 g/dL — ABNORMAL LOW (ref 3.5–5.2)
Alkaline Phosphatase: 52 U/L (ref 39–117)
Chloride: 103 mEq/L (ref 96–112)
Potassium: 3.8 mEq/L (ref 3.5–5.1)
Sodium: 139 mEq/L (ref 135–145)
Total Bilirubin: 0.2 mg/dL — ABNORMAL LOW (ref 0.3–1.2)
Total Protein: 6.3 g/dL (ref 6.0–8.3)

## 2012-04-16 LAB — CBC
Hemoglobin: 12.4 g/dL (ref 12.0–15.0)
MCH: 29.6 pg (ref 26.0–34.0)
RBC: 4.19 MIL/uL (ref 3.87–5.11)
WBC: 6.6 10*3/uL (ref 4.0–10.5)

## 2012-04-16 LAB — SURGICAL PCR SCREEN
MRSA, PCR: NEGATIVE
Staphylococcus aureus: NEGATIVE

## 2012-04-16 NOTE — Progress Notes (Signed)
Patient to try CHG soap as trial to foot to see if she has a reaction or not .  She will notify nurse if has a reaction or not .  Nurse opted for her to use only hyposensitive soap at home.  Patient wanted to try the CHG.  Bottle of CHG given to patient to try on test area.

## 2012-04-16 NOTE — Progress Notes (Signed)
Patient to wear braces on hands day of surgery.  Nurse told patient that she would be unable to wear into operating room unless someone allows.

## 2012-04-19 ENCOUNTER — Encounter (HOSPITAL_COMMUNITY): Payer: Self-pay | Admitting: Anesthesiology

## 2012-04-19 ENCOUNTER — Inpatient Hospital Stay (HOSPITAL_COMMUNITY)
Admission: RE | Admit: 2012-04-19 | Discharge: 2012-04-22 | DRG: 327 | Disposition: A | Discharge status: Home / Self Care | Payer: Medicare Other | Source: Ambulatory Visit | Attending: General Surgery | Admitting: General Surgery

## 2012-04-19 ENCOUNTER — Ambulatory Visit (HOSPITAL_COMMUNITY): Payer: Medicare Other | Admitting: Anesthesiology

## 2012-04-19 ENCOUNTER — Encounter (HOSPITAL_COMMUNITY): Payer: Self-pay | Admitting: *Deleted

## 2012-04-19 ENCOUNTER — Encounter (HOSPITAL_COMMUNITY)
Admission: RE | Disposition: A | Discharge status: Home / Self Care | Payer: Self-pay | Source: Ambulatory Visit | Attending: General Surgery

## 2012-04-19 DIAGNOSIS — E559 Vitamin D deficiency, unspecified: Secondary | ICD-10-CM | POA: Diagnosis present

## 2012-04-19 DIAGNOSIS — G2581 Restless legs syndrome: Secondary | ICD-10-CM | POA: Diagnosis present

## 2012-04-19 DIAGNOSIS — K449 Diaphragmatic hernia without obstruction or gangrene: Secondary | ICD-10-CM

## 2012-04-19 DIAGNOSIS — K219 Gastro-esophageal reflux disease without esophagitis: Secondary | ICD-10-CM | POA: Diagnosis present

## 2012-04-19 DIAGNOSIS — Z23 Encounter for immunization: Secondary | ICD-10-CM

## 2012-04-19 DIAGNOSIS — K319 Disease of stomach and duodenum, unspecified: Secondary | ICD-10-CM | POA: Diagnosis present

## 2012-04-19 DIAGNOSIS — J90 Pleural effusion, not elsewhere classified: Secondary | ICD-10-CM | POA: Diagnosis not present

## 2012-04-19 DIAGNOSIS — Z8719 Personal history of other diseases of the digestive system: Secondary | ICD-10-CM

## 2012-04-19 DIAGNOSIS — D509 Iron deficiency anemia, unspecified: Secondary | ICD-10-CM | POA: Diagnosis present

## 2012-04-19 DIAGNOSIS — Z9089 Acquired absence of other organs: Secondary | ICD-10-CM

## 2012-04-19 DIAGNOSIS — M949 Disorder of cartilage, unspecified: Secondary | ICD-10-CM | POA: Diagnosis present

## 2012-04-19 DIAGNOSIS — Z79899 Other long term (current) drug therapy: Secondary | ICD-10-CM

## 2012-04-19 DIAGNOSIS — J449 Chronic obstructive pulmonary disease, unspecified: Secondary | ICD-10-CM | POA: Diagnosis present

## 2012-04-19 DIAGNOSIS — Z882 Allergy status to sulfonamides status: Secondary | ICD-10-CM

## 2012-04-19 DIAGNOSIS — E785 Hyperlipidemia, unspecified: Secondary | ICD-10-CM | POA: Diagnosis present

## 2012-04-19 DIAGNOSIS — K224 Dyskinesia of esophagus: Secondary | ICD-10-CM | POA: Diagnosis present

## 2012-04-19 DIAGNOSIS — J4489 Other specified chronic obstructive pulmonary disease: Secondary | ICD-10-CM | POA: Diagnosis present

## 2012-04-19 DIAGNOSIS — M129 Arthropathy, unspecified: Secondary | ICD-10-CM | POA: Diagnosis present

## 2012-04-19 DIAGNOSIS — F1010 Alcohol abuse, uncomplicated: Secondary | ICD-10-CM | POA: Diagnosis present

## 2012-04-19 DIAGNOSIS — F329 Major depressive disorder, single episode, unspecified: Secondary | ICD-10-CM | POA: Diagnosis present

## 2012-04-19 DIAGNOSIS — M899 Disorder of bone, unspecified: Secondary | ICD-10-CM | POA: Diagnosis present

## 2012-04-19 DIAGNOSIS — G5600 Carpal tunnel syndrome, unspecified upper limb: Secondary | ICD-10-CM | POA: Diagnosis present

## 2012-04-19 DIAGNOSIS — J309 Allergic rhinitis, unspecified: Secondary | ICD-10-CM | POA: Diagnosis present

## 2012-04-19 DIAGNOSIS — Z87891 Personal history of nicotine dependence: Secondary | ICD-10-CM

## 2012-04-19 DIAGNOSIS — F3289 Other specified depressive episodes: Secondary | ICD-10-CM | POA: Diagnosis present

## 2012-04-19 DIAGNOSIS — K440 Diaphragmatic hernia with obstruction, without gangrene: Principal | ICD-10-CM | POA: Diagnosis present

## 2012-04-19 HISTORY — PX: HIATAL HERNIA REPAIR: SHX195

## 2012-04-19 SURGERY — REPAIR, HERNIA, HIATAL, LAPAROSCOPIC
Anesthesia: Choice | Site: Esophagus | Wound class: Clean

## 2012-04-19 MED ORDER — LIDOCAINE HCL 1 % IJ SOLN
INTRAMUSCULAR | Status: DC | PRN
  Administered 2012-04-19: 50 mg via INTRADERMAL

## 2012-04-19 MED ORDER — HYDROMORPHONE HCL PF 1 MG/ML IJ SOLN
INTRAMUSCULAR | Status: AC
  Filled 2012-04-19: qty 1

## 2012-04-19 MED ORDER — HYDROMORPHONE HCL PF 1 MG/ML IJ SOLN
INTRAMUSCULAR | Status: DC | PRN
  Administered 2012-04-19: 1 mg via INTRAVENOUS

## 2012-04-19 MED ORDER — ROCURONIUM BROMIDE 100 MG/10ML IV SOLN
INTRAVENOUS | Status: DC | PRN
  Administered 2012-04-19 (×2): 10 mg via INTRAVENOUS
  Administered 2012-04-19: 40 mg via INTRAVENOUS

## 2012-04-19 MED ORDER — GLYCOPYRROLATE 0.2 MG/ML IJ SOLN
INTRAMUSCULAR | Status: DC | PRN
  Administered 2012-04-19: 0.4 mg via INTRAVENOUS

## 2012-04-19 MED ORDER — ONDANSETRON HCL 4 MG/2ML IJ SOLN
INTRAMUSCULAR | Status: DC | PRN
  Administered 2012-04-19: 4 mg via INTRAVENOUS

## 2012-04-19 MED ORDER — LACTATED RINGERS IV SOLN: INTRAVENOUS | Status: DC

## 2012-04-19 MED ORDER — LACTATED RINGERS IV SOLN
INTRAVENOUS | Status: DC | PRN
  Administered 2012-04-19 (×3): via INTRAVENOUS

## 2012-04-19 MED ORDER — PROMETHAZINE HCL 25 MG/ML IJ SOLN: 6.2500 mg | INTRAMUSCULAR | Status: DC | PRN

## 2012-04-19 MED ORDER — BUPIVACAINE-EPINEPHRINE 0.25% -1:200000 IJ SOLN
INTRAMUSCULAR | Status: AC
  Filled 2012-04-19: qty 1

## 2012-04-19 MED ORDER — SUCCINYLCHOLINE CHLORIDE 20 MG/ML IJ SOLN
INTRAMUSCULAR | Status: DC | PRN
  Administered 2012-04-19: 100 mg via INTRAVENOUS

## 2012-04-19 MED ORDER — HEPARIN SODIUM (PORCINE) 5000 UNIT/ML IJ SOLN
5000.0000 [IU] | Freq: Three times a day (TID) | INTRAMUSCULAR | Status: DC
  Administered 2012-04-19 – 2012-04-22 (×7): 5000 [IU] via SUBCUTANEOUS
  Filled 2012-04-19 (×11): qty 1

## 2012-04-19 MED ORDER — HYDROMORPHONE HCL PF 1 MG/ML IJ SOLN
0.2500 mg | INTRAMUSCULAR | Status: DC | PRN
  Administered 2012-04-19 (×2): 0.5 mg via INTRAVENOUS

## 2012-04-19 MED ORDER — MIDAZOLAM HCL 5 MG/5ML IJ SOLN
INTRAMUSCULAR | Status: DC | PRN
  Administered 2012-04-19: 2 mg via INTRAVENOUS

## 2012-04-19 MED ORDER — CHLORHEXIDINE GLUCONATE 4 % EX LIQD
1.0000 "application " | Freq: Once | CUTANEOUS | Status: DC
  Filled 2012-04-19: qty 15

## 2012-04-19 MED ORDER — FENTANYL CITRATE 0.05 MG/ML IJ SOLN
INTRAMUSCULAR | Status: DC | PRN
  Administered 2012-04-19 (×2): 100 ug via INTRAVENOUS
  Administered 2012-04-19: 50 ug via INTRAVENOUS
  Administered 2012-04-19 (×2): 100 ug via INTRAVENOUS
  Administered 2012-04-19: 50 ug via INTRAVENOUS

## 2012-04-19 MED ORDER — BUPIVACAINE-EPINEPHRINE PF 0.25-1:200000 % IJ SOLN
INTRAMUSCULAR | Status: DC | PRN
  Administered 2012-04-19: 22 mL

## 2012-04-19 MED ORDER — LACTATED RINGERS IR SOLN
Status: DC | PRN
  Administered 2012-04-19: 1000 mL

## 2012-04-19 MED ORDER — ONDANSETRON HCL 4 MG PO TABS: 4.0000 mg | ORAL_TABLET | Freq: Four times a day (QID) | ORAL | Status: DC | PRN

## 2012-04-19 MED ORDER — ACETAMINOPHEN 10 MG/ML IV SOLN
INTRAVENOUS | Status: AC
  Filled 2012-04-19: qty 100

## 2012-04-19 MED ORDER — EPHEDRINE SULFATE 50 MG/ML IJ SOLN
INTRAMUSCULAR | Status: DC | PRN
  Administered 2012-04-19 (×2): 10 mg via INTRAVENOUS

## 2012-04-19 MED ORDER — INFLUENZA VIRUS VACC SPLIT PF IM SUSP: 0.5000 mL | INTRAMUSCULAR | Status: DC | PRN

## 2012-04-19 MED ORDER — NEOSTIGMINE METHYLSULFATE 1 MG/ML IJ SOLN
INTRAMUSCULAR | Status: DC | PRN
  Administered 2012-04-19: 4 mg via INTRAVENOUS

## 2012-04-19 MED ORDER — ACETAMINOPHEN 10 MG/ML IV SOLN
INTRAVENOUS | Status: DC | PRN
  Administered 2012-04-19: 1000 mg via INTRAVENOUS

## 2012-04-19 MED ORDER — DIPHENHYDRAMINE HCL 50 MG/ML IJ SOLN
25.0000 mg | Freq: Four times a day (QID) | INTRAMUSCULAR | Status: DC | PRN
  Administered 2012-04-19 – 2012-04-22 (×7): 25 mg via INTRAVENOUS
  Filled 2012-04-19 (×7): qty 1

## 2012-04-19 MED ORDER — DEXAMETHASONE SODIUM PHOSPHATE 10 MG/ML IJ SOLN
INTRAMUSCULAR | Status: DC | PRN
  Administered 2012-04-19: 10 mg via INTRAVENOUS

## 2012-04-19 MED ORDER — PROPOFOL 10 MG/ML IV BOLUS
INTRAVENOUS | Status: DC | PRN
  Administered 2012-04-19: 150 mg via INTRAVENOUS

## 2012-04-19 MED ORDER — CEFAZOLIN SODIUM-DEXTROSE 2-3 GM-% IV SOLR
INTRAVENOUS | Status: AC
  Filled 2012-04-19: qty 50

## 2012-04-19 MED ORDER — ONDANSETRON HCL 4 MG/2ML IJ SOLN: 4.0000 mg | Freq: Four times a day (QID) | INTRAMUSCULAR | Status: DC | PRN

## 2012-04-19 MED ORDER — DIPHENHYDRAMINE HCL 50 MG/ML IJ SOLN
INTRAMUSCULAR | Status: DC | PRN
  Administered 2012-04-19: 50 mg via INTRAVENOUS

## 2012-04-19 MED ORDER — TISSEEL VH 10 ML EX KIT
PACK | CUTANEOUS | Status: AC
  Filled 2012-04-19: qty 1

## 2012-04-19 MED ORDER — BUDESONIDE-FORMOTEROL FUMARATE 80-4.5 MCG/ACT IN AERO
2.0000 | INHALATION_SPRAY | Freq: Two times a day (BID) | RESPIRATORY_TRACT | Status: DC
  Administered 2012-04-19 – 2012-04-22 (×6): 2 via RESPIRATORY_TRACT
  Filled 2012-04-19: qty 6.9

## 2012-04-19 MED ORDER — FLUTICASONE PROPIONATE 50 MCG/ACT NA SUSP
1.0000 | Freq: Every morning | NASAL | Status: DC
  Administered 2012-04-20 – 2012-04-22 (×2): 1 via NASAL
  Filled 2012-04-19: qty 16

## 2012-04-19 MED ORDER — KCL IN DEXTROSE-NACL 20-5-0.9 MEQ/L-%-% IV SOLN
INTRAVENOUS | Status: DC
  Administered 2012-04-19 – 2012-04-21 (×4): via INTRAVENOUS
  Filled 2012-04-19 (×4): qty 1000

## 2012-04-19 MED ORDER — CEFAZOLIN SODIUM-DEXTROSE 2-3 GM-% IV SOLR
2.0000 g | INTRAVENOUS | Status: AC
  Administered 2012-04-19: 2 g via INTRAVENOUS

## 2012-04-19 MED ORDER — PANTOPRAZOLE SODIUM 40 MG IV SOLR
40.0000 mg | INTRAVENOUS | Status: DC
  Administered 2012-04-19 – 2012-04-20 (×2): 40 mg via INTRAVENOUS
  Filled 2012-04-19 (×3): qty 40

## 2012-04-19 MED ORDER — MORPHINE SULFATE 2 MG/ML IJ SOLN
2.0000 mg | INTRAMUSCULAR | Status: DC | PRN
  Administered 2012-04-19 (×2): 2 mg via INTRAVENOUS
  Administered 2012-04-19 – 2012-04-20 (×3): 4 mg via INTRAVENOUS
  Administered 2012-04-20: 2 mg via INTRAVENOUS
  Administered 2012-04-20: 4 mg via INTRAVENOUS
  Administered 2012-04-20: 2 mg via INTRAVENOUS
  Administered 2012-04-20: 4 mg via INTRAVENOUS
  Administered 2012-04-20: 2 mg via INTRAVENOUS
  Filled 2012-04-19 (×2): qty 1
  Filled 2012-04-19: qty 2
  Filled 2012-04-19 (×2): qty 1
  Filled 2012-04-19: qty 2
  Filled 2012-04-19: qty 1
  Filled 2012-04-19: qty 2
  Filled 2012-04-19: qty 1
  Filled 2012-04-19: qty 2
  Filled 2012-04-19 (×2): qty 1

## 2012-04-19 MED ORDER — TISSEEL VH 10 ML EX KIT
PACK | CUTANEOUS | Status: DC | PRN
  Administered 2012-04-19: 10 mL

## 2012-04-19 SURGICAL SUPPLY — 49 items
APPLIER CLIP ROT 10 11.4 M/L (STAPLE) ×2
BENZOIN TINCTURE PRP APPL 2/3 (GAUZE/BANDAGES/DRESSINGS) IMPLANT
CANISTER SUCTION 2500CC (MISCELLANEOUS) IMPLANT
CLAMP ENDO BABCK 10MM (STAPLE) ×2 IMPLANT
CLIP APPLIE ROT 10 11.4 M/L (STAPLE) ×1 IMPLANT
CLOTH BEACON ORANGE TIMEOUT ST (SAFETY) ×2 IMPLANT
DECANTER SPIKE VIAL GLASS SM (MISCELLANEOUS) ×2 IMPLANT
DERMABOND ADVANCED (GAUZE/BANDAGES/DRESSINGS) ×1
DERMABOND ADVANCED .7 DNX12 (GAUZE/BANDAGES/DRESSINGS) ×1 IMPLANT
DEVICE SUT QUICK LOAD TK 5 (STAPLE) ×12 IMPLANT
DEVICE SUT TI-KNOT TK 5X26 (MISCELLANEOUS) ×2 IMPLANT
DEVICE SUTURE ENDOST 10MM (ENDOMECHANICALS) ×2 IMPLANT
DISSECTOR BLUNT TIP ENDO 5MM (MISCELLANEOUS) IMPLANT
DRAIN PENROSE 18X1/2 LTX STRL (DRAIN) ×2 IMPLANT
DRAPE LAPAROSCOPIC ABDOMINAL (DRAPES) ×2 IMPLANT
DUPLOJECT EASY PREP 4ML (MISCELLANEOUS) ×2 IMPLANT
ELECT REM PT RETURN 9FT ADLT (ELECTROSURGICAL) ×2
ELECTRODE REM PT RTRN 9FT ADLT (ELECTROSURGICAL) ×1 IMPLANT
FELT TEFLON 4 X1 (Mesh General) ×2 IMPLANT
GLOVE BIOGEL PI IND STRL 7.0 (GLOVE) ×1 IMPLANT
GLOVE BIOGEL PI INDICATOR 7.0 (GLOVE) ×1
GOWN STRL NON-REIN LRG LVL3 (GOWN DISPOSABLE) ×4 IMPLANT
GOWN STRL REIN XL XLG (GOWN DISPOSABLE) ×4 IMPLANT
GRASPER ENDO BABCOCK 10 (MISCELLANEOUS) IMPLANT
GRASPER ENDO BABCOCK 10MM (MISCELLANEOUS)
KIT BASIN OR (CUSTOM PROCEDURE TRAY) ×2 IMPLANT
MESH SURGISIS HERNIA ×2 IMPLANT
NS IRRIG 1000ML POUR BTL (IV SOLUTION) ×2 IMPLANT
PENCIL BUTTON HOLSTER BLD 10FT (ELECTRODE) IMPLANT
SCALPEL HARMONIC ACE (MISCELLANEOUS) ×2 IMPLANT
SET IRRIG TUBING LAPAROSCOPIC (IRRIGATION / IRRIGATOR) ×2 IMPLANT
SLEEVE Z-THREAD 5X100MM (TROCAR) IMPLANT
SOLUTION ANTI FOG 6CC (MISCELLANEOUS) ×2 IMPLANT
STAPLER VISISTAT 35W (STAPLE) ×2 IMPLANT
STRIP CLOSURE SKIN 1/2X4 (GAUZE/BANDAGES/DRESSINGS) IMPLANT
SUT MNCRL AB 4-0 PS2 18 (SUTURE) ×2 IMPLANT
SUT SURGIDAC NAB ES-9 0 48 120 (SUTURE) ×14 IMPLANT
TIP INNERVISION DETACH 40FR (MISCELLANEOUS) IMPLANT
TIP INNERVISION DETACH 50FR (MISCELLANEOUS) IMPLANT
TIP INNERVISION DETACH 56FR (MISCELLANEOUS) ×2 IMPLANT
TIPS INNERVISION DETACH 40FR (MISCELLANEOUS)
TOWEL OR 17X26 10 PK STRL BLUE (TOWEL DISPOSABLE) ×4 IMPLANT
TRAY FOLEY CATH 14FRSI W/METER (CATHETERS) ×2 IMPLANT
TRAY LAP CHOLE (CUSTOM PROCEDURE TRAY) ×2 IMPLANT
TROCAR XCEL NON-BLD 11X100MML (ENDOMECHANICALS) IMPLANT
TROCAR Z-THREAD FIOS 11X100 BL (TROCAR) ×2 IMPLANT
TROCAR Z-THREAD FIOS 5X100MM (TROCAR) ×6 IMPLANT
TROCAR Z-THREAD SLEEVE 11X100 (TROCAR) ×6 IMPLANT
TUBING INSUFFLATION 10FT LAP (TUBING) ×2 IMPLANT

## 2012-04-19 NOTE — H&P (View-Only) (Signed)
Subjective:   hiatal hernia, dysphagia, shortness of breath  Patient ID: Lindsay Porter, female   DOB: [birth year], 71 y.o.   MRN: [id number]  HPI  Patient is a very pleasant 71 year old female referred by Dr. Matthias Hughs for consideration for surgical repair of a large hiatal hernia. The patient states that she has had a known hiatal hernia for approximately 20 years. She initially had some chest pain and underwent a cardiac workup years ago and then GI workup which showed a hiatal hernia. She initially had some reflux symptoms but these were controlled with medications. In recent years she has had increasing trouble with dysphagia. She describes frequent episodes where she needs a small amount and gets quite a bit of severe pressure and pain in her lower chest. She will sometimes have regurgitation. She also has had some increasing shortness of breath. She has seen Dr. Shelle Iron and has had pulmonary function studies and has been diagnosed with moderate COPD. She is an ex-smoker quit 4 years ago. Her shortness of breath by her history seems to occur lot when she bends over but not so much when she walks and she is able to walk a number of blocks. She does not really have any classic heartburn symptoms. She does have some chronic hoarseness and will often have a nighttime cough. She has been thoroughly evaluated by Dr. Matthias Hughs including upper endoscopy which showed a large hiatal hernia. She has had an upper GI series performed in May which I personally reviewed. This shows a large hiatal hernia with about half of the stomach above the diaphragm and organoaxial portion with significant twisting of the stomach and some pooling of contrast in the upper stomach. Also noted was the esophageal dysmotility. She recently has been on a combination of Protonix and Zegerid and has had some significant improvement in her symptoms but still bothering her.  Past Medical History  Diagnosis Date  . Arthritis   . Restless leg  syndrome   . Seasonal allergies   . Hiatal hernia   . Hyperlipidemia   . Insomnia   . Depression   . Diverticulosis   . Ovarian cyst   . Vitamin d deficiency   . Osteopenia   . Carpal tunnel syndrome   . Alcohol abuse   . Anemia, iron deficiency    Past Surgical History  Procedure Date  . Ankle surgery 07/07/2009  . Shoulder surgery   . Knee surgery   . Tonsillectomy   . Appendectomy   . Bunionectomy   . Breast lumpectomy   . Nose surgery 12/2010   Current Outpatient Prescriptions  Medication Sig Dispense Refill  . budesonide-formoterol (SYMBICORT) 80-4.5 MCG/ACT inhaler Inhale 2 puffs into the lungs 2 (two) times daily.      . diphenhydrAMINE (BENADRYL) 25 MG tablet Take 25 mg by mouth every 6 (six) hours as needed.      Marland Kitchen EPINEPHrine (EPIPEN) 0.3 mg/0.3 mL DEVI Inject 0.3 mg into the muscle once.      . ergocalciferol (VITAMIN D2) 50000 UNITS capsule Take 50,000 Units by mouth once a week.      . fexofenadine (ALLEGRA) 180 MG tablet Take 180 mg by mouth daily.      . fluticasone (FLONASE) 50 MCG/ACT nasal spray 1 spray in each nostril daily      . IRON PO Take 325 mg by mouth daily.       Marland Kitchen omeprazole-sodium bicarbonate (ZEGERID) 40-1100 MG per capsule 1 by mouth daily      .  pantoprazole (PROTONIX) 40 MG tablet Once daily      . ROPINIRole HCl (REQUIP PO) Take 2 mg by mouth 2 (two) times daily.       . SERTRALINE HCL PO Take 100 mg by mouth daily.        Allergies  Allergen Reactions  . Sulfa Antibiotics    History  Substance Use Topics  . Smoking status: Former Smoker -- 4.5 packs/day for 45 years    Types: Cigarettes    Quit date: 10/02/2007  . Smokeless tobacco: Never Used  . Alcohol Use: Yes     quit in 2011    Review of Systems  Respiratory: Positive for shortness of breath. Negative for wheezing and stridor.   Cardiovascular: Negative.   Gastrointestinal: Positive for diarrhea. Negative for nausea, abdominal pain, constipation, blood in stool and  abdominal distention.  Musculoskeletal: Positive for arthralgias.       Objective:   Physical Exam Exam BP 110/72  Pulse 72  Temp 97.8 F (36.6 C) (Temporal)  Resp 18  Ht 5' 5.5" (1.664 m)  Wt 157 lb 12.8 oz (71.578 kg)  BMI 25.86 kg/m2  General: Alert, well-developed Caucasian female, in no distress Skin: Warm and dry without rash or infection. HEENT: No palpable masses or thyromegaly. Sclera nonicteric. Pupils equal round and reactive. Oropharynx clear. Lymph nodes: No cervical, supraclavicular, or inguinal nodes palpable. Lungs:mild kyphosis there Breath sounds clear and equal, slightly decreased, without increased work of breathing Cardiovascular: Regular rate and rhythm without murmur. No JVD or edema. Peripheral pulses intact. Abdomen: Nondistended. Soft and nontender. No masses palpable. No organomegaly. No palpable hernias. Extremities: No edema or joint swelling or deformity. No chronic venous stasis changes. Neurologic: Alert and fully oriented. Gait normal.      Assessment:     71 year old female with large hiatal hernia with organoaxial volvulus. She has had significant dysphagia symptoms as well as what sounds like some occasional nighttime aspiration. She does not have typical reflux symptoms. She has shortness of breath which seems to be more related to bending of exertional possibly related to abdominal pressure and contributed to by her hernia. She also has some degree of esophageal dysmotility on her upper GI series. All in all her hernia appears very significant and I think she could expect some significant symptom relief with repair of her hiatal hernia. The degree to which it would improve her dysphagia is a little bit in question because of her esophageal dysmotility and it is not exactly clear how much her COPD versus her hernia contributes to her shortness of breath. This was all discussed with her and her family. They would like to go ahead with repair and I  think this is very reasonable. I discussed laparoscopic repair. We discussed use of biologic mesh. Risks of surgery including visceral injury, anesthetic risks, bleeding, infection and long-term complications including recurrence, stricture and gas bloat were discussed. Due to her esophageal dysmotility and minimal reflux symptoms I would suggest a partial wrap.    Plan:     Laparoscopic repair of large hiatal hernia with partial fundoplication.

## 2012-04-19 NOTE — Interval H&P Note (Signed)
History and Physical Interval Note:  04/19/2012 7:16 AM  Lindsay Porter  has presented today for surgery, with the diagnosis of hiatal hernia  The various methods of treatment have been discussed with the patient and family. After consideration of risks, benefits and other options for treatment, the patient has consented to  Procedure(s) (LRB) with comments: LAPAROSCOPIC REPAIR OF HIATAL HERNIA (N/A) as a surgical intervention .  The patient's history has been reviewed, patient examined, no change in status, stable for surgery.  I have reviewed the patient's chart and labs.  Questions were answered to the patient's satisfaction.     Chiffon Kittleson T

## 2012-04-19 NOTE — Anesthesia Preprocedure Evaluation (Signed)
Anesthesia Evaluation  Patient identified by MRN, date of birth, ID band Patient awake    Reviewed: Allergy & Precautions, H&P , NPO status , Patient's Chart, lab work & pertinent test results  Airway Mallampati: II TM Distance: >3 FB     Dental  (+) Teeth Intact and Dental Advisory Given   Pulmonary COPD breath sounds clear to auscultation  Pulmonary exam normal       Cardiovascular negative cardio ROS  Rhythm:Regular Rate:Normal     Neuro/Psych Depression  Neuromuscular disease negative psych ROS   GI/Hepatic Neg liver ROS, hiatal hernia, GERD-  ,  Endo/Other  negative endocrine ROS  Renal/GU negative Renal ROS  negative genitourinary   Musculoskeletal negative musculoskeletal ROS (+)   Abdominal   Peds negative pediatric ROS (+)  Hematology negative hematology ROS (+)   Anesthesia Other Findings   Reproductive/Obstetrics negative OB ROS                           Anesthesia Physical Anesthesia Plan  ASA: II  Anesthesia Plan: General   Post-op Pain Management:    Induction: Intravenous  Airway Management Planned: Oral ETT  Additional Equipment:   Intra-op Plan:   Post-operative Plan: Extubation in OR  Informed Consent: I have reviewed the patients History and Physical, chart, labs and discussed the procedure including the risks, benefits and alternatives for the proposed anesthesia with the patient or authorized representative who has indicated his/her understanding and acceptance.   Dental advisory given  Plan Discussed with: CRNA  Anesthesia Plan Comments:         Anesthesia Quick Evaluation

## 2012-04-19 NOTE — Progress Notes (Signed)
   CARE MANAGEMENT NOTE 04/19/2012  Patient:  Lindsay Porter, Lindsay Porter   Account Number:  [id number]  Date Initiated:  04/19/2012  Documentation initiated by:  PEARSON,COOKIE  Subjective/Objective Assessment:   71yo pt admitted for Hiatal Hernia surgery     Action/Plan:   home   Anticipated DC Date:  04/21/2012   Anticipated DC Plan:  HOME/SELF CARE      DC Planning Services  CM consult      Choice offered to / List presented to:             Status of service:  In process, will continue to follow Medicare Important Message given?  NO (If response is "NO", the following Medicare IM given date fields will be blank) Date Medicare IM given:   Date Additional Medicare IM given:    Discharge Disposition:  HOME/SELF CARE  Per UR Regulation:  Reviewed for med. necessity/level of care/duration of stay  If discussed at Long Length of Stay Meetings, dates discussed:    Comments:  04/19/12 MPearson, RN, BSN Chart reviewed.

## 2012-04-19 NOTE — Transfer of Care (Signed)
Immediate Anesthesia Transfer of Care Note  Patient: Lindsay Porter  Procedure(s) Performed: Procedure(s) (LRB) with comments: LAPAROSCOPIC REPAIR OF HIATAL HERNIA (N/A)  Patient Location: PACU  Anesthesia Type: General  Level of Consciousness: awake and alert   Airway & Oxygen Therapy: Patient Spontanous Breathing  Post-op Assessment: Report given to PACU RN  Post vital signs: Reviewed and stable  Complications: No apparent anesthesia complications

## 2012-04-19 NOTE — Progress Notes (Signed)
Patient has red splotches over trunk, legs and arm (history of Rash) states she tolerated Hibiclens shower at home.

## 2012-04-19 NOTE — Anesthesia Postprocedure Evaluation (Signed)
Anesthesia Post Note  Patient: Lindsay Porter  Procedure(s) Performed: Procedure(s) (LRB): LAPAROSCOPIC REPAIR OF HIATAL HERNIA (N/A)  Anesthesia type: General  Patient location: PACU  Post pain: Pain level controlled  Post assessment: Post-op Vital signs reviewed  Last Vitals:  Filed Vitals:   04/19/12 1045  BP:   Pulse:   Temp:   Resp: 15    Post vital signs: Reviewed  Level of consciousness: sedated  Complications: No apparent anesthesia complications

## 2012-04-19 NOTE — Op Note (Signed)
Preoperative Diagnosis: large hiatal hernia with volvulus and reflux  Postoprative Diagnosis: same  Procedure: Procedure(s): LAPAROSCOPIC REPAIR OF HIATAL HERNIA WITH TOUPET FUNDOPLICATION   Surgeon: Glenna Fellows T   Assistants: Manus Rudd  Anesthesia:  General endotracheal anesthesiaDiagnos  Indications:   Patient is a 71 year old female with a long history of known hiatal hernia. She has recently developed increasing symptoms with nighttime regurgitation reflux and cough and increasing shortness of breath as well as some dysphagia. Upper GI series and endoscopy has shown a large hiatal hernia with evidence of volvulus. After extensive discussion of benefits and risks in the office detailed elsewhere we elect to proceed with laparoscopic repair of her large hiatal hernia. She does have some degree of reflux but also esophageal dysmotility by upper GI series and we have elected to perform a partial toupet fundoplication.  Procedure Detail:  Patient is brought to the operating room, placed in supine position on the operating table, and general endotracheal anesthesia induced. She received preoperative IV antibiotics. PAS were in place. Patient timeout was performed and correct procedure verified. Access was obtained with a 5 mm Optiview trocar in the left upper quadrant without difficulty and pneumoperitoneum established. There was no evidence of trocar injury. Under direct vision a 5 mm trocar was placed laterally in the right upper quadrant, a 10 mm trocar in the right midabdomen, a 10 mm trocar just above and to the left of the umbilicus for the camera port and an additional 5 mm trocar in the left flank. Through a 5 mm subxiphoid site the [employer] [employer] retractor was placed in the left lobe of the liver elevated with excellent exposure of the hiatus and upper stomach. There was a obvious large hiatal hernia with probably a third to one half of the stomach in the chest. The omentum and  stomach were reduced down out of the hernia. The gastrohepatic omentum was opened and dissection carried down to the right crus. The hernia sac was incised along the anterior right crus and then working along the anterior crura and down the left crus. The hernia sac was then stripped down out of the mediastinum anteriorly. This was a very large hernia sac but it came down nicely. The esophagus was identified and protected. After completely taking down the hernia sac anteriorly we followed the right crura down posteriorly further stripping the hernia sac out posteriorly and laterally and the finding the confluence of the crura posteriorly. Following this the fundus was exposed and the lesser sac entered by dividing the lesser omentum up fairly high on the fundus. Short gastrics were then taken down with the Harmonic scalpel working up to the angle of Hiss. There were adhesions here that were carefully taken down and the hernia sac further stripped down along the left side as well. The dissection was carried down to the confluence of the crura from the left side and then a retroesophageal window opened without difficulty. A Penrose drain was placed around the EG junction and then the esophagus could be further retracted inferiorly and from side to side and we took down further attachments of the esophagus well up into the mediastinum and completely freed up from the diaphragmatic opening. At this point we had very good mobility with the EG junction lying naturally below the diaphragm. Following this the large hernia sac was excised with the harmonic scalpel and removed. A posterior crural repair was then performed with interrupted 0 Ethibond sutures which were pledgeted and this was closed down to  where there was just a minimal opening around a lighted 56 bougie dilator which passed orally. The dilator was retracted back into the abdomen. I then used a Surgysis hiatal hernia patch with the slot oriented to the  patient's right, the newer design. This was moistened and placed in the abdomen and oriented around the esophagus with very nice coverage of the anterior and posterior as well as left lateral diaphragm. The patch was sutured into place with several interrupted 3-0 Vicryl sutures and then further secured using Tisseel. Following this we constructed a 270 Toupet wrap bringing a mobile portion of the fundus behind the esophagus and finding a contiguous portion on the left side. Beginning on the patient's right the wrap was sutured up to the edge of the diaphragm anteriorly and then with several more sutures down along the anterolateral esophagus. The left side of the wrap was then sutured in an identical fashion. The dilator was then removed. The abdomen was inspected for hemostasis or any evidence of trocar injury and everything looked fine. The Nathanson retractor was removed under direct vision and all CO2 evacuated and trochars removed. Sponge needle and counts were correct. Skin incisions were closed with subcuticular Monocryl and Dermabond.   Estimated Blood Loss:  Minimal         Drains: none  Blood Given: none          Specimens: none        Complications:  * No complications entered in OR log *         Disposition: PACU - hemodynamically stable.         Condition: stable  Mariella Saa MD, FACS  04/19/2012, 10:38 AM

## 2012-04-20 LAB — BASIC METABOLIC PANEL
CO2: 29 mEq/L (ref 19–32)
Chloride: 103 mEq/L (ref 96–112)
GFR calc non Af Amer: >90 mL/min (ref >90)
Glucose, Bld: 131 mg/dL — ABNORMAL HIGH (ref 70–99)
Potassium: 3.9 mEq/L (ref 3.5–5.1)
Sodium: 138 mEq/L (ref 135–145)

## 2012-04-20 LAB — CBC
HCT: 31.2 % — ABNORMAL LOW (ref 36.0–46.0)
Hemoglobin: 10.2 g/dL — ABNORMAL LOW (ref 12.0–15.0)
RBC: 3.5 MIL/uL — ABNORMAL LOW (ref 3.87–5.11)
WBC: 6 10*3/uL (ref 4.0–10.5)

## 2012-04-20 MED ORDER — OXYCODONE-ACETAMINOPHEN 5-325 MG/5ML PO SOLN
10.0000 mL | ORAL | Status: DC | PRN
  Administered 2012-04-20 (×2): 10 mL via ORAL
  Administered 2012-04-21: 5 mL via ORAL
  Administered 2012-04-21 (×3): 10 mL via ORAL
  Filled 2012-04-20: qty 10
  Filled 2012-04-20 (×2): qty 5
  Filled 2012-04-20 (×4): qty 10

## 2012-04-20 MED ORDER — ROPINIROLE HCL 1 MG PO TABS
2.0000 mg | ORAL_TABLET | Freq: Two times a day (BID) | ORAL | Status: DC
  Administered 2012-04-20 – 2012-04-22 (×5): 2 mg via ORAL
  Filled 2012-04-20 (×8): qty 2

## 2012-04-20 MED ORDER — SERTRALINE HCL 50 MG PO TABS
50.0000 mg | ORAL_TABLET | Freq: Every day | ORAL | Status: DC
  Administered 2012-04-21 – 2012-04-22 (×2): 50 mg via ORAL
  Filled 2012-04-20 (×3): qty 1

## 2012-04-20 NOTE — Progress Notes (Signed)
Patient ID: Lindsay Porter, female   DOB: 06-Aug-1940, 71 y.o.   MRN: [id number] 1 Day Post-Op  Subjective: Some shoulder, chest and abd pain but not severe.  No nausea  Objective: Vital signs in last 24 hours: Temp:  [96.9 F (36.1 C)-98.6 F (37 C)] 97.8 F (36.6 C) (10/19 0509) Pulse Rate:  [67-89] 70  (10/19 0509) Resp:  [15-18] 16  (10/19 0509) BP: (102-137)/(66-78) 104/66 mmHg (10/19 0509) SpO2:  [93 %-100 %] 93 % (10/19 0509) Weight:  [153 lb (69.4 kg)] 153 lb (69.4 kg) (10/18 1300) Last BM Date: 04/19/12  Intake/Output from previous day: 10/18 0701 - 10/19 0700 In: 4358.3 [I.V.:4358.3] Out: 3100 [Urine:3100] Intake/Output this shift:    General appearance: alert, cooperative and no distress Resp: clear to auscultation bilaterally GI: normal findings: soft, non-tender Incision/Wound: Clean and dry  Lab Results:   [facility] 04/20/12 0417  WBC 6.0  HGB 10.2*  HCT 31.2*  PLT 264   BMET  Basename 04/20/12 0417  NA 138  K 3.9  CL 103  CO2 29  GLUCOSE 131*  BUN 7  CREATININE 0.51  CALCIUM 8.4     Studies/Results: No results found.  Anti-infectives: Anti-infectives     Start     Dose/Rate Route Frequency Ordered Stop   04/19/12 0558   ceFAZolin (ANCEF) IVPB 2 g/50 mL premix        2 g 100 mL/hr over 30 Minutes Intravenous 60 min pre-op 04/19/12 0558 04/19/12 0750          Assessment/Plan: s/p Procedure(s): LAPAROSCOPIC REPAIR OF HIATAL HERNIA Doing well without apparent complication Start CL diet   LOS: 1 day    Ladanian Kelter T 04/20/2012

## 2012-04-21 ENCOUNTER — Inpatient Hospital Stay (HOSPITAL_COMMUNITY): Payer: Medicare Other

## 2012-04-21 LAB — BASIC METABOLIC PANEL
BUN: 5 mg/dL — ABNORMAL LOW (ref 6–23)
CO2: 27 mEq/L (ref 19–32)
Calcium: 8.5 mg/dL (ref 8.4–10.5)
Glucose, Bld: 106 mg/dL — ABNORMAL HIGH (ref 70–99)
Sodium: 135 mEq/L (ref 135–145)

## 2012-04-21 LAB — CBC
HCT: 31.9 % — ABNORMAL LOW (ref 36.0–46.0)
Hemoglobin: 10.2 g/dL — ABNORMAL LOW (ref 12.0–15.0)
MCH: 29.1 pg (ref 26.0–34.0)
MCHC: 32 g/dL (ref 30.0–36.0)
MCV: 90.9 fL (ref 78.0–100.0)
Platelets: 252 K/uL (ref 150–400)
RBC: 3.51 MIL/uL — ABNORMAL LOW (ref 3.87–5.11)
RDW: 15 % (ref 11.5–15.5)
WBC: 6.2 K/uL (ref 4.0–10.5)

## 2012-04-21 MED ORDER — OXYCODONE-ACETAMINOPHEN 5-325 MG/5ML PO SOLN: 10.0000 mL | ORAL | Status: DC | PRN

## 2012-04-21 MED ORDER — PANTOPRAZOLE SODIUM 40 MG PO TBEC
40.0000 mg | DELAYED_RELEASE_TABLET | Freq: Every day | ORAL | Status: DC
  Administered 2012-04-22: 40 mg via ORAL
  Filled 2012-04-21: qty 1

## 2012-04-21 NOTE — Progress Notes (Signed)
Patient ID: ARMINTA GAMM, female   DOB: [birth year], 71 y.o.   MRN: [id number] 2 Days Post-Op  Subjective: Up walking in halls. Has had some mild SOB and O2 sats running a little low. Tol CL diet well with out pain or nausea  Objective: Vital signs in last 24 hours: Temp:  [98 F (36.7 C)-99.3 F (37.4 C)] 98.4 F (36.9 C) (10/20 0500) Pulse Rate:  [71-85] 71  (10/20 0500) Resp:  [16] 16  (10/20 0500) BP: (97-115)/(63-70) 104/70 mmHg (10/20 0500) SpO2:  [88 %-98 %] 88 % (10/20 0845) Last BM Date: 04/19/12  Intake/Output from previous day: 10/19 0701 - 10/20 0700 In: 1590 [P.O.:360; I.V.:1230] Out: 1050 [Urine:1050] Intake/Output this shift: Total I/O In: 240 [P.O.:240] Out: -   General appearance: alert and no distress Resp: clear to auscultation bilaterally GI: normal findings: soft, non-tender  Lab Results:   [facility] 04/21/12 0450 04/20/12 0417  WBC 6.2 6.0  HGB 10.2* 10.2*  HCT 31.9* 31.2*  PLT 252 264   BMET  Basename 04/21/12 0450 04/20/12 0417  NA 135 138  K 3.5 3.9  CL 101 103  CO2 27 29  GLUCOSE 106* 131*  BUN 5* 7  CREATININE 0.47* 0.51  CALCIUM 8.5 8.4     Studies/Results: No results found.  Anti-infectives: Anti-infectives     Start     Dose/Rate Route Frequency Ordered Stop   04/19/12 0558   ceFAZolin (ANCEF) IVPB 2 g/50 mL premix        2 g 100 mL/hr over 30 Minutes Intravenous 60 min pre-op 04/19/12 0558 04/19/12 0750          Assessment/Plan: s/p Procedure(s): LAPAROSCOPIC REPAIR OF HIATAL HERNIA COPD Doing well x marginal O2 sats Will check CXR and observe for today [place] diet IF CXR neg and sats improve could go home later today   LOS: 2 days    Kwasi Joung T 04/21/2012

## 2012-04-21 NOTE — Progress Notes (Signed)
CXR noted.  She has small bilat. 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  She is sating okay but she would like to stay to ensure that her breathing is okay prior to discharge.  Will monitor pulse ox overnight and if her breathing continues to improve, she will likely be okay for discharge tomorrow because she actually looks really good and is very mobile.

## 2012-04-22 ENCOUNTER — Encounter (HOSPITAL_COMMUNITY): Payer: Self-pay | Admitting: General Surgery

## 2012-04-22 ENCOUNTER — Inpatient Hospital Stay (HOSPITAL_COMMUNITY): Payer: Medicare Other

## 2012-04-22 NOTE — Care Management Note (Signed)
    Page 1 of 1   04/22/2012     1:01:46 PM   CARE MANAGEMENT NOTE 04/22/2012  Patient:  Lindsay Porter, Lindsay Porter   Account Number:  [id number]  Date Initiated:  04/19/2012  Documentation initiated by:  PEARSON,COOKIE  Subjective/Objective Assessment:   71yo pt admitted for Hiatal Hernia surgery     Action/Plan:   home   Anticipated DC Date:  04/22/2012   Anticipated DC Plan:  HOME/SELF CARE      DC Planning Services  CM consult      Choice offered to / List presented to:             Status of service:  Completed, signed off Medicare Important Message given?  NO (If response is "NO", the following Medicare IM given date fields will be blank) Date Medicare IM given:   Date Additional Medicare IM given:    Discharge Disposition:  HOME/SELF CARE  Per UR Regulation:  Reviewed for med. necessity/level of care/duration of stay  If discussed at Long Length of Stay Meetings, dates discussed:    Comments:  04/19/12 MPearson, RN, BSN Chart reviewed.

## 2012-04-22 NOTE — Discharge Summary (Signed)
Patient ID: Lindsay Porter [id number] 71 y.o. 1940-09-01  04/19/2012  Discharge date and time: 04/22/2012   Admitting Physician: Glenna Fellows T  Discharge Physician: Glenna Fellows T  Admission Diagnoses:large hiatal hernia  Discharge Diagnoses: same  Operations: Procedure(s): LAPAROSCOPIC REPAIR OF HIATAL HERNIA  Admission Condition: good  Discharged Condition: good  Indication for Admission: patient is a 71 year old female with a known large hiatal hernia. She has had gradually worsening symptoms with nighttime regurgitation and dysphagia. Recent imaging has shown a large hiatal hernia with approximately 1/2-2/3 of the stomach in the chest with evidence of torsion. With this finding an increasing symptoms after extensive discussion detailed elsewhere we elect to proceed with laparoscopic repair of her hiatal hernia and partial fundoplication due to mild reflux with some evidence of esophageal dysmotility on her upper GI series.  Hospital Course: patient was admitted on the morning of her procedure and underwent laparoscopic repair of her large hiatal hernia with biologic mesh reinforcement and Toupet fundoplication. Her postoperative course was very smooth. On the first postoperative day her vital signs were stable with a benign abdomen. She was started on a clear liquid diet which he tolerated well. On the second postoperative day she was advanced to full liquids which he tolerated well. She was walking in the halls but had marginally low O2 saturations in the high 80s with some mild shortness of breath on exertion.  Chest x-ray was obtained showing basilar atelectasis and minimal pleural effusions. She was observed for 24 hours and with pulmonary toilet on the day of discharge her options saturations are 94% on room air she has no shortness of breath. No abdominal or chest pain. Tolerating full liquid well. Abdomen is soft and nontender and wounds healing without evidence  of infection.  Disposition: Home  Patient Instructions:   Lindsay Porter, Lindsay Porter  Home Medication Instructions [id number]   Printed on:04/22/12 1027  Medication Information                    IRON PO Take 325 mg by mouth 2 (two) times daily.            budesonide-formoterol (SYMBICORT) 80-4.5 MCG/ACT inhaler Inhale 2 puffs into the lungs 2 (two) times daily.           fexofenadine (ALLEGRA) 180 MG tablet Take 180 mg by mouth daily.           EPINEPHrine (EPIPEN) 0.3 mg/0.3 mL DEVI Inject 0.3 mg into the muscle once.           fluticasone (FLONASE) 50 MCG/ACT nasal spray Place 1 spray into the nose every morning.            diphenhydrAMINE (BENADRYL) 25 MG tablet Take 25 mg by mouth as needed. For allergies or itching           Vitamin D, Ergocalciferol, (DRISDOL) 50000 UNITS CAPS Take 50,000 Units by mouth every Monday.           omeprazole-sodium bicarbonate (ZEGERID) 40-1100 MG per capsule Take 1 capsule by mouth at bedtime.           pantoprazole (PROTONIX) 40 MG tablet Take 40 mg by mouth daily before breakfast.           sertraline (ZOLOFT) 100 MG tablet Take 50 mg by mouth daily.            rOPINIRole (REQUIP) 2 MG tablet Take 2 mg by mouth 2 (two) times daily. Patient taken total of 8  mg daily and decreasing to 6 mg daily           hydrocodone-acetaminophen (LORCET-HD) 5-500 MG per capsule Take 1-2 capsules by mouth every 4 (four) hours as needed. For pain           acetaminophen (TYLENOL) 500 MG tablet Take 500 mg by mouth every 6 (six) hours as needed. For pain           diclofenac sodium (VOLTAREN) 1 % GEL Apply 2 g topically as needed. For pain on hand           Tea Tree Oil 15 % LIQD Apply 1 application topically 4 (four) times daily as needed. Rub on hand as needed for pain           ibuprofen (ADVIL,MOTRIN) 200 MG tablet Take 200 mg by mouth every 6 (six) hours as needed. Total of 800 mg daily           diphenhydramine-acetaminophen (TYLENOL PM)  25-500 MG TABS Take 1 tablet by mouth at bedtime as needed. Patient states she takes 2 at bedtime           oxyCODONE-acetaminophen (ROXICET) 5-325 MG/5ML solution Take 10 mLs by mouth every 4 (four) hours as needed.             Activity: activity as tolerated Diet: full liquid diet Wound Care: none needed  Follow-up:  With Dr. Johna Sheriff in 2 weeks.  Signed: Mariella Saa MD, FACS  04/22/2012, 10:27 AM

## 2012-04-23 ENCOUNTER — Telehealth (INDEPENDENT_AMBULATORY_CARE_PROVIDER_SITE_OTHER): Payer: Self-pay

## 2012-04-23 NOTE — Telephone Encounter (Signed)
Patient given post op appointment for 05/09/12 @ 10:40 w/Dr. Johna Sheriff.

## 2012-04-29 ENCOUNTER — Telehealth (INDEPENDENT_AMBULATORY_CARE_PROVIDER_SITE_OTHER): Payer: Self-pay

## 2012-04-29 NOTE — Telephone Encounter (Signed)
Patient called asking if she had to stay on a liquid diet until her follow up visit in 3 weeks. She also asked if it was normal to have pain in shoulder, back and neck that she thinks is gas pains. I told her she could start a soft diet at about the 2 week mark and to try and use some gas x to relieve the gas pains she is having. Patient understood and will call back with any questions or concerns.

## 2012-05-09 ENCOUNTER — Ambulatory Visit (INDEPENDENT_AMBULATORY_CARE_PROVIDER_SITE_OTHER): Payer: Medicare Other | Admitting: General Surgery

## 2012-05-09 ENCOUNTER — Encounter (INDEPENDENT_AMBULATORY_CARE_PROVIDER_SITE_OTHER): Payer: Self-pay | Admitting: General Surgery

## 2012-05-09 VITALS — BP 124/80 | HR 77 | Temp 98.7°F | Resp 18 | Ht 66.0 in | Wt 146.4 lb

## 2012-05-09 DIAGNOSIS — Z09 Encounter for follow-up examination after completed treatment for conditions other than malignant neoplasm: Secondary | ICD-10-CM

## 2012-05-09 NOTE — Patient Instructions (Signed)
Continue soft diet as you're doing for the next 2 weeks then if feeling well can cautiously begin solid foods. Do not eat for at least 2 hours prior to bedtime Avoid nonsteroidal anti-inflammatory drugs such as Motrin or Aleve

## 2012-05-09 NOTE — Progress Notes (Signed)
History: Patient returns 3 weeks following laparoscopic repair of giant hiatal hernia and Toupe fundoplication. She generally is getting along very well, tolerating a full liquid diet without difficulty and having no dysphagia or reflux. She did however have one episode last night where she fairly close to bedtime and took 2 Aleve tablets where she had a feeling of dysphagia and some regurgitation as she had had previously. She feels fine again today.  Exam: BP 124/80  Pulse 77  Temp 98.7 F (37.1 C) (Oral)  Resp 18  Ht 5\' 6"  (1.676 m)  Wt 146 lb 6.4 oz (66.407 kg)  BMI 23.63 kg/m2 General: Appears well Abdomen: Soft and nontender. Incisions are healing well.  Assessment and plan: Overall I think she is doing very well. I wouldn't be too concerned about a single episode of dysphagia last night. I asked her to avoid all nonsteroidals. She will stay on a soft diet for 2 weeks and then gradually start solid foods. She is not having any reflux off her Protonix. See her back in 3 weeks.

## 2012-05-13 ENCOUNTER — Telehealth (INDEPENDENT_AMBULATORY_CARE_PROVIDER_SITE_OTHER): Payer: Self-pay

## 2012-05-13 NOTE — Telephone Encounter (Signed)
Patient notified of follow up appointment scheduled for 06/07/12 @ 11:30 am w/Dr. Johna Sheriff.

## 2012-06-07 ENCOUNTER — Encounter (INDEPENDENT_AMBULATORY_CARE_PROVIDER_SITE_OTHER): Payer: Self-pay | Admitting: General Surgery

## 2012-06-07 ENCOUNTER — Ambulatory Visit (INDEPENDENT_AMBULATORY_CARE_PROVIDER_SITE_OTHER): Payer: Medicare Other | Admitting: General Surgery

## 2012-06-07 DIAGNOSIS — Z09 Encounter for follow-up examination after completed treatment for conditions other than malignant neoplasm: Secondary | ICD-10-CM

## 2012-06-07 NOTE — Patient Instructions (Addendum)
May advance to regular diet as tolerated. No restrictions. Please call for questions or concerns

## 2012-06-07 NOTE — Progress Notes (Signed)
History: Patient returns for more long-term followup following laparoscopic repair of her giant hiatal hernia with toupee fundoplication. She reports she is doing very well. She has been having small amounts of solid food cautiously and not having any dysphagia or pain. She has no reflux or heartburn symptoms off of all medication.  Exam: She appears well. Abdomen is soft and nontender. Wounds all well healed.  Assessment and plan: Doing very well following laparoscopic repair of her giant hiatal hernia and partial fundoplication. I told her she should gradually advance her diet without restrictions. She is doing well enough I will see her back as needed.

## 2012-07-09 ENCOUNTER — Ambulatory Visit: Payer: Medicare Other | Admitting: Pulmonary Disease

## 2012-07-12 ENCOUNTER — Ambulatory Visit (INDEPENDENT_AMBULATORY_CARE_PROVIDER_SITE_OTHER): Payer: Medicare Other | Admitting: Pulmonary Disease

## 2012-07-12 ENCOUNTER — Encounter: Payer: Self-pay | Admitting: Pulmonary Disease

## 2012-07-12 VITALS — BP 108/72 | HR 92 | Temp 98.7°F | Ht 66.0 in | Wt 151.8 lb

## 2012-07-12 DIAGNOSIS — J449 Chronic obstructive pulmonary disease, unspecified: Secondary | ICD-10-CM

## 2012-07-12 NOTE — Patient Instructions (Addendum)
Stay on symbicort twice a day, and keep mouth rinsed. Work on weight reduction, and some type of conditioning program. followup with me in 6mos.

## 2012-07-12 NOTE — Progress Notes (Signed)
  Subjective:    Patient ID: Lindsay Porter, female    DOB: 1941/03/27, 72 y.o.   MRN: [id number]  HPI Patient comes in today for followup of her known COPD.  She is doing very well on symbicort alone, and feels that her exertional tolerance has improved.  Her symptoms of chest tightness have also improved since her hiatal hernia surgery.  She has had no significant pulmonary infection or acute exacerbation since last visit.   Review of Systems  Constitutional: Negative for fever and unexpected weight change.  HENT: Positive for congestion. Negative for ear pain, nosebleeds, sore throat, rhinorrhea, sneezing, trouble swallowing, dental problem, postnasal drip and sinus pressure.   Eyes: Negative for redness and itching.  Respiratory: Negative for cough, chest tightness, shortness of breath and wheezing.   Cardiovascular: Negative for palpitations and leg swelling.  Gastrointestinal: Negative for nausea and vomiting.  Genitourinary: Negative for dysuria.  Musculoskeletal: Negative for joint swelling.  Skin: Negative for rash.  Neurological: Negative for headaches.  Hematological: Does not bruise/bleed easily.  Psychiatric/Behavioral: Negative for dysphoric mood. The patient is not nervous/anxious.        Objective:   Physical Exam Well-developed female in no acute distress Nose without purulence or discharge noted Neck without lymphadenopathy or thyromegaly Chest with mildly decreased breath sounds, no wheezing Cardiac exam is regular rate and rhythm Lower extremities without edema, no cyanosis Alert and oriented, moves all 4 extremities.       Assessment & Plan:

## 2012-07-12 NOTE — Assessment & Plan Note (Signed)
The patient is doing very well from a pulmonary standpoint on her current bronchodilator regimen.  I've asked her to continue on this, and work aggressively on weight loss and some type of conditioning program.  I will see her back in 6 months, and if doing well, we'll change her to yearly visits.

## 2012-07-17 ENCOUNTER — Other Ambulatory Visit: Payer: Self-pay | Admitting: Family Medicine

## 2012-07-17 DIAGNOSIS — Z1231 Encounter for screening mammogram for malignant neoplasm of breast: Secondary | ICD-10-CM

## 2012-07-18 DIAGNOSIS — J449 Chronic obstructive pulmonary disease, unspecified: Secondary | ICD-10-CM | POA: Insufficient documentation

## 2012-07-18 DIAGNOSIS — F332 Major depressive disorder, recurrent severe without psychotic features: Secondary | ICD-10-CM | POA: Insufficient documentation

## 2012-07-18 DIAGNOSIS — F1020 Alcohol dependence, uncomplicated: Secondary | ICD-10-CM | POA: Insufficient documentation

## 2012-08-19 ENCOUNTER — Ambulatory Visit
Admission: RE | Admit: 2012-08-19 | Discharge: 2012-08-19 | Disposition: A | Discharge status: Home / Self Care | Payer: Medicare Other | Source: Ambulatory Visit | Attending: Family Medicine | Admitting: Family Medicine

## 2012-08-19 DIAGNOSIS — Z1231 Encounter for screening mammogram for malignant neoplasm of breast: Secondary | ICD-10-CM

## 2013-01-08 ENCOUNTER — Encounter: Payer: Self-pay | Admitting: Pulmonary Disease

## 2013-01-08 ENCOUNTER — Ambulatory Visit (INDEPENDENT_AMBULATORY_CARE_PROVIDER_SITE_OTHER): Payer: Medicare Other | Admitting: Pulmonary Disease

## 2013-01-08 VITALS — BP 102/68 | HR 65 | Temp 98.3°F | Ht 65.0 in | Wt 155.6 lb

## 2013-01-08 DIAGNOSIS — J449 Chronic obstructive pulmonary disease, unspecified: Secondary | ICD-10-CM

## 2013-01-08 NOTE — Assessment & Plan Note (Signed)
The patient is doing fairly well overall, and has not had an acute exacerbation pulmonary infection since last visit.  She is not using symbicort on a regular basis, and I have asked her to do this so that we can make a critical assessment whether it is helping her breathing enough to continue.  She also needs to have a rescue inhaler available for emergencies.

## 2013-01-08 NOTE — Progress Notes (Signed)
  Subjective:    Patient ID: Lindsay Porter, female    DOB: 1940/09/17, 72 y.o.   MRN: [id number]  HPI Patient comes in today for followup of her known COPD.  She has not been using symbicort consistently, and does not have a rescue inhaler available.  She feels that she has been breathing fairly well, but still has both good and bad days.  I have explained to her that she needs to use symbicort on a regular basis to see if that helps her overall breathing.  She feels that she is doing much better since her hiatal hernia surgery.  She denies any significant chest congestion or cough.   Review of Systems  Constitutional: Negative for fever and unexpected weight change.  HENT: Negative for ear pain, nosebleeds, congestion, sore throat, rhinorrhea, sneezing, trouble swallowing, dental problem, postnasal drip and sinus pressure.   Eyes: Negative for redness and itching.  Respiratory: Positive for cough, choking and shortness of breath. Negative for chest tightness and wheezing.   Cardiovascular: Negative for palpitations and leg swelling.  Gastrointestinal: Negative for nausea and vomiting.  Genitourinary: Negative for dysuria.  Musculoskeletal: Negative for joint swelling.  Skin: Negative for rash.  Neurological: Negative for headaches.  Hematological: Does not bruise/bleed easily.  Psychiatric/Behavioral: Negative for dysphoric mood. The patient is not nervous/anxious.        Objective:   Physical Exam Well-developed female in no acute distress Nose without purulent discharge noted Neck without lymphadenopathy or thyromegaly Chest with clear breath sounds throughout, no wheezing Cardiac exam with regular rate and rhythm Lower extremities without edema, no cyanosis Alert and oriented, moves all 4 extremities.       Assessment & Plan:

## 2013-01-08 NOTE — Patient Instructions (Addendum)
Stay on symbicort 2puffs am and pm everyday, and rinse mouth well after using.  Use consistently for one month and see if it helps your breathing. Can use albuterol inhaler, 2 puffs every 6hrs if needed for emergencies. Stay as active as possible followup with me again in 12mos.

## 2013-02-05 ENCOUNTER — Other Ambulatory Visit: Payer: Self-pay

## 2013-05-08 ENCOUNTER — Other Ambulatory Visit: Payer: Self-pay

## 2014-01-06 ENCOUNTER — Other Ambulatory Visit: Payer: Self-pay | Admitting: Pulmonary Disease

## 2014-01-09 ENCOUNTER — Telehealth (HOSPITAL_COMMUNITY): Payer: Self-pay

## 2014-01-09 ENCOUNTER — Encounter: Payer: Self-pay | Admitting: Pulmonary Disease

## 2014-01-09 ENCOUNTER — Ambulatory Visit (INDEPENDENT_AMBULATORY_CARE_PROVIDER_SITE_OTHER): Payer: Medicare HMO | Admitting: Pulmonary Disease

## 2014-01-09 VITALS — BP 118/72 | HR 67 | Temp 97.8°F | Ht 66.0 in | Wt 160.8 lb

## 2014-01-09 DIAGNOSIS — J438 Other emphysema: Secondary | ICD-10-CM

## 2014-01-09 NOTE — Telephone Encounter (Signed)
I have called and left a message with Anthon[facility] to inquire about participation in Pulmonary Rehab. Will send letter in mail and follow up.

## 2014-01-09 NOTE — Assessment & Plan Note (Signed)
The patient has had a slow decline in her exertional tolerance over the last one year despite being on Symbicort. It is unclear whether this is related to her weight and conditioning, or whether she needs a little more aggressive treatment. I would like to refer her to the pulmonary rehabilitation program at the hospital, and would also like to try her on stiolto. The patient will let me know how she does on this medication. I also encouraged her to work on weight loss.

## 2014-01-09 NOTE — Patient Instructions (Signed)
Hold symbicort for now, and try stiolto 2 inhalations each am.  At the end of 4 weeks, let me know what you think about the different meds Stay on albuterol as needed. Will refer to pulmonary rehab at [facility].  Let us know if you do not hear from them in a few weeks.  followup with me in one year.

## 2014-01-09 NOTE — Progress Notes (Signed)
   Subjective:    Patient ID: Lindsay Porter, female    DOB: [birth year], 73 y.o.   MRN: [id number]  HPI Patient comes in today for followup of her known moderate COPD. She has been on Symbicort regularly, but has not seen a big difference in her symptoms. However, she has not had an acute exacerbation either. She feels over the last one year that she has had a slow decline in her exertional tolerance, but she is also not doing any type of conditioning program. She denies any chest congestion or cough with purulence.   Review of Systems  Constitutional: Negative for fever and unexpected weight change.  HENT: Negative for congestion, dental problem, ear pain, nosebleeds, postnasal drip, rhinorrhea, sinus pressure, sneezing, sore throat and trouble swallowing.   Eyes: Negative for redness and itching.  Respiratory: Positive for shortness of breath. Negative for cough, chest tightness and wheezing.   Cardiovascular: Negative for palpitations and leg swelling.  Gastrointestinal: Negative for nausea and vomiting.  Genitourinary: Negative for dysuria.  Musculoskeletal: Negative for joint swelling.  Skin: Negative for rash.  Neurological: Negative for headaches.  Hematological: Does not bruise/bleed easily.  Psychiatric/Behavioral: Negative for dysphoric mood. The patient is not nervous/anxious.        Objective:   Physical Exam Overweight female in no acute distress Nose without purulence or discharge noted Neck without lymphadenopathy or thyromegaly Chest with mildly decreased breath sounds, no active wheezing Cardiac exam with regular rate and rhythm Lower extremities without edema, no cyanosis Alert and oriented, moves all 4 extremities.       Assessment & Plan:

## 2014-01-22 ENCOUNTER — Other Ambulatory Visit: Payer: Self-pay | Admitting: Family Medicine

## 2014-01-22 DIAGNOSIS — M8580 Other specified disorders of bone density and structure, unspecified site: Secondary | ICD-10-CM

## 2014-01-22 DIAGNOSIS — Z1231 Encounter for screening mammogram for malignant neoplasm of breast: Secondary | ICD-10-CM

## 2014-01-26 ENCOUNTER — Encounter (HOSPITAL_COMMUNITY)
Admission: RE | Admit: 2014-01-26 | Discharge: 2014-01-26 | Disposition: A | Discharge status: Home / Self Care | Payer: Medicare HMO | Source: Ambulatory Visit | Attending: Pulmonary Disease | Admitting: Pulmonary Disease

## 2014-01-26 VITALS — BP 100/54 | HR 68

## 2014-01-26 DIAGNOSIS — J449 Chronic obstructive pulmonary disease, unspecified: Secondary | ICD-10-CM | POA: Diagnosis present

## 2014-01-26 DIAGNOSIS — J4489 Other specified chronic obstructive pulmonary disease: Secondary | ICD-10-CM | POA: Insufficient documentation

## 2014-01-26 DIAGNOSIS — J438 Other emphysema: Secondary | ICD-10-CM

## 2014-01-26 NOTE — Progress Notes (Signed)
Lindsay Porter 73 y.o. female Pulmonary Rehab Orientation Note Patient arrived today in Cardiac and Pulmonary Rehab for orientation to Pulmonary Rehab. She walked from the [employer] independently. She does not carry portable oxygen. Per pt, she uses oxygen never. Color good, skin warm and dry. Patient is oriented to time and place. Patient's medical history and medications reviewed. Heart rate is normal, breath sounds clear to auscultation, no wheezes, rales, or rhonchi. Grip strength equal, strong. Distal pulses 3+ bilateral pedal pulses present without peripheral edema. Patient reports she does take medications as prescribed. Patient states she follows a regular diet. The patient reports no specific efforts to gain or lose weight.  Patient's weight will be monitored closely. Demonstration and practice of PLB using pulse oximeter. Patient able to return demonstration satisfactorily. Safety and hand hygiene in the exercise area reviewed with patient. Patient voices understanding of the information reviewed. Department expectations discussed with patient and achievable goals were set. The patient shows enthusiasm about attending the program and we look forward to working with this nice lady. The patient is scheduled for a 6 min walk test on Thursday, January 29, 2014 @ 3:45 pm and to begin exercise on Tuesday, February 03, 2014 in the 10:30 am class.   Rosebud Poles RN [phone]

## 2014-01-27 ENCOUNTER — Ambulatory Visit
Admission: RE | Admit: 2014-01-27 | Discharge: 2014-01-27 | Disposition: A | Discharge status: Home / Self Care | Payer: Medicare HMO | Source: Ambulatory Visit | Attending: Family Medicine | Admitting: Family Medicine

## 2014-01-27 ENCOUNTER — Other Ambulatory Visit: Payer: Medicare HMO

## 2014-01-27 DIAGNOSIS — Z1231 Encounter for screening mammogram for malignant neoplasm of breast: Secondary | ICD-10-CM

## 2014-01-29 ENCOUNTER — Encounter (HOSPITAL_COMMUNITY)
Admission: RE | Admit: 2014-01-29 | Discharge: 2014-01-29 | Disposition: A | Discharge status: Home / Self Care | Payer: Medicare HMO | Source: Ambulatory Visit | Attending: Pulmonary Disease | Admitting: Pulmonary Disease

## 2014-01-29 DIAGNOSIS — J449 Chronic obstructive pulmonary disease, unspecified: Secondary | ICD-10-CM | POA: Diagnosis not present

## 2014-01-29 NOTE — Progress Notes (Signed)
Lindsay Porter completed a Six-Minute Walk Test on 01/29/14 . Lindsay Porter walked 1,304 feet with 0 breaks.  The patient's lowest oxygen saturation was 90% , highest heart rate was 89bpm , and highest blood pressure was 122/64. The patient was room air. Lindsay Porter stated that nothing hindered their walk test.

## 2014-02-02 ENCOUNTER — Ambulatory Visit
Admission: RE | Admit: 2014-02-02 | Discharge: 2014-02-02 | Disposition: A | Discharge status: Home / Self Care | Payer: Medicare HMO | Source: Ambulatory Visit | Attending: Family Medicine | Admitting: Family Medicine

## 2014-02-02 DIAGNOSIS — M8580 Other specified disorders of bone density and structure, unspecified site: Secondary | ICD-10-CM

## 2014-02-03 ENCOUNTER — Encounter (HOSPITAL_COMMUNITY)
Admission: RE | Admit: 2014-02-03 | Discharge: 2014-02-03 | Disposition: A | Discharge status: Home / Self Care | Payer: Medicare HMO | Source: Ambulatory Visit | Attending: Pulmonary Disease | Admitting: Pulmonary Disease

## 2014-02-03 DIAGNOSIS — J449 Chronic obstructive pulmonary disease, unspecified: Secondary | ICD-10-CM | POA: Insufficient documentation

## 2014-02-03 DIAGNOSIS — J4489 Other specified chronic obstructive pulmonary disease: Secondary | ICD-10-CM | POA: Insufficient documentation

## 2014-02-03 NOTE — Progress Notes (Signed)
Today, Lindsay Porter exercised at [employer]. Service time was from 1030 to 1200.  The patient exercised for more than 31 minutes performing aerobic, strengthening, and stretching exercises. Oxygen saturation, heart rate, blood pressure, rate of perceived exertion, and shortness of breath were all monitored before, during, and after exercise. Lindsay Porter presented with no problems at today's exercise session.   There was one workload change during today's exercise session.  Pre-exercise vitals:   Weight kg: 72.2   Liters of O2: RA   SpO2: 96   HR: 77   BP: 108/50   CBG: NA  Exercise vitals:   Highest heartrate:  84   Lowest oxygen saturation: 96   Highest blood pressure: 120/64   Liters of 02: RA  Post-exercise vitals:   SpO2: 95   HR: 66   BP: 114/69   Liters of O2: RA   CBG: NA Dr. Brand Males, Medical Director Dr. Coralyn Pear is immediately available during today's Pulmonary Rehab session for [name] on 02/03/2014 at 1030 class time.

## 2014-02-05 ENCOUNTER — Encounter (HOSPITAL_COMMUNITY)
Admission: RE | Admit: 2014-02-05 | Discharge: 2014-02-05 | Disposition: A | Discharge status: Home / Self Care | Payer: Medicare HMO | Source: Ambulatory Visit | Attending: Pulmonary Disease | Admitting: Pulmonary Disease

## 2014-02-05 DIAGNOSIS — J449 Chronic obstructive pulmonary disease, unspecified: Secondary | ICD-10-CM | POA: Diagnosis not present

## 2014-02-05 NOTE — Progress Notes (Signed)
Today, Reeshemah exercised at [employer]. [facility]. Service time was from 1030 to 1230.  The patient exercised for more than 31 minutes performing aerobic, strengthening, and stretching exercises. Oxygen saturation, heart rate, blood pressure, rate of perceived exertion, and shortness of breath were all monitored before, during, and after exercise. Garnette presented with no problems at today's exercise session. Deandrea also attended an education session on [employer].  There was a workload change during today's exercise session.  Pre-exercise vitals:   Weight kg: 71.5   Liters of O2: ra   SpO2: 96   HR: 64   BP: 96/60   CBG: na  Exercise vitals:   Highest heartrate:  89   Lowest oxygen saturation: 95   Highest blood pressure: 104/60   Liters of 02: ra  Post-exercise vitals:   SpO2: 97   HR: 65   BP: 123/72   Liters of O2: ra   CBG: na  Dr. Brand Males, Medical Director Dr. Frederic Jericho is immediately available during today's Pulmonary Rehab session for [place] on 02/05/2014 at 1030 class time.

## 2014-02-10 ENCOUNTER — Encounter (HOSPITAL_COMMUNITY)
Admission: RE | Admit: 2014-02-10 | Discharge: 2014-02-10 | Disposition: A | Discharge status: Home / Self Care | Payer: Medicare HMO | Source: Ambulatory Visit | Attending: Pulmonary Disease | Admitting: Pulmonary Disease

## 2014-02-10 DIAGNOSIS — J449 Chronic obstructive pulmonary disease, unspecified: Secondary | ICD-10-CM | POA: Diagnosis not present

## 2014-02-10 NOTE — Progress Notes (Signed)
I have reviewed a Home Exercise Prescription with Lindsay Porter . [name] is not currently exercising at home.  The patient was advised to walk 3-5 days a week for 45 minutes.  [name] and I discussed how to progress their exercise prescription.  The patient stated that their goals were to lose about 30 pounds, increase metabolism, and increase energy.  The patient stated that they understand the exercise prescription.  We reviewed exercise guidelines, target heart rate during exercise, oxygen use, weather, home pulse oximeter, endpoints for exercise, and goals.  Patient is encouraged to come to me with any questions. I will continue to follow up with the patient to assist them with progression and safety.

## 2014-02-10 NOTE — Progress Notes (Signed)
Today, Yuliet exercised at [employer]. [facility]. Service time was from 10:30am to 12:15pm.  The patient exercised for more than 31 minutes performing aerobic, strengthening, and stretching exercises. Oxygen saturation, heart rate, blood pressure, rate of perceived exertion, and shortness of breath were all monitored before, during, and after exercise. Ailea presented with no problems at today's exercise session.   There was no workload change during today's exercise session.  Pre-exercise vitals:   Weight kg: 72.0   Liters of O2: ra   SpO2: 95   HR: 82   BP: 110/62   CBG: na  Exercise vitals:   Highest heartrate:  96   Lowest oxygen saturation: 95   Highest blood pressure: 126/60   Liters of 02: ra  Post-exercise vitals:   SpO2: 96   HR: 77   BP: 122/60   Liters of O2: ra   CBG: na  Dr. Brand Males, Medical Director Dr. Frederic Jericho is immediately available during today's Pulmonary Rehab session for Lindsay Porter on 02/10/14 at 10:30am class time.

## 2014-02-12 ENCOUNTER — Encounter (HOSPITAL_COMMUNITY)
Admission: RE | Admit: 2014-02-12 | Discharge: 2014-02-12 | Disposition: A | Discharge status: Home / Self Care | Payer: Medicare HMO | Source: Ambulatory Visit | Attending: Pulmonary Disease | Admitting: Pulmonary Disease

## 2014-02-12 DIAGNOSIS — J449 Chronic obstructive pulmonary disease, unspecified: Secondary | ICD-10-CM | POA: Diagnosis not present

## 2014-02-12 NOTE — Progress Notes (Signed)
Today, Lindsay Porter exercised at [employer]. [facility]. Service time was from 10:30am to 12:30pm.  The patient exercised for more than 31 minutes performing aerobic, strengthening, and stretching exercises. Oxygen saturation, heart rate, blood pressure, rate of perceived exertion, and shortness of breath were all monitored before, during, and after exercise. Lindsay Porter presented with no problems at today's exercise session. The patient attended Pulmonary Medications education class today with [facility]  There was no workload change during today's exercise session.  Pre-exercise vitals:   Weight kg: 72.4   Liters of O2: ra   SpO2: 96   HR: 69   BP: 98/60   CBG: na  Exercise vitals:   Highest heartrate:  83   Lowest oxygen saturation: 95   Highest blood pressure: 122/80   Liters of 02: ra  Post-exercise vitals:   SpO2: 95   HR: 80   BP: 114/78   Liters of O2: ra   CBG: na  Dr. Brand Males, Medical Director Dr. Maryland Pink is immediately available during today's Pulmonary Rehab session for Lindsay Porter on 02/12/14 at 10:30am class time.

## 2014-02-17 ENCOUNTER — Encounter (HOSPITAL_COMMUNITY)
Admission: RE | Admit: 2014-02-17 | Discharge: 2014-02-17 | Disposition: A | Discharge status: Home / Self Care | Payer: Medicare HMO | Source: Ambulatory Visit | Attending: Pulmonary Disease | Admitting: Pulmonary Disease

## 2014-02-17 DIAGNOSIS — J449 Chronic obstructive pulmonary disease, unspecified: Secondary | ICD-10-CM | POA: Diagnosis not present

## 2014-02-17 NOTE — Progress Notes (Signed)
Today, Lindsay Porter exercised at [employer]. [facility]. Service time was from 1030 to 1200.  The patient exercised for more than 31 minutes performing aerobic, strengthening, and stretching exercises. Oxygen saturation, heart rate, blood pressure, rate of perceived exertion, and shortness of breath were all monitored before, during, and after exercise. Lindsay Porter presented with no problems at today's exercise session.   There was a workload change during today's exercise session.  Pre-exercise vitals:   Weight kg: 72.3   Liters of O2: ra   SpO2: 96   HR: 75   BP: 108/64   CBG: na  Exercise vitals:   Highest heartrate:  109   Lowest oxygen saturation: 93   Highest blood pressure: 100/60   Liters of 02: ra  Post-exercise vitals:   SpO2: 95   HR: 78   BP: 126/64   Liters of O2: ra   CBG: na  Dr. Brand Males, Medical Director Dr. Maryland Pink is immediately available during today's Pulmonary Rehab session for [place] on 02/17/2014 at 1030 class time.

## 2014-02-19 ENCOUNTER — Encounter (HOSPITAL_COMMUNITY)
Admission: RE | Admit: 2014-02-19 | Discharge: 2014-02-19 | Disposition: A | Discharge status: Home / Self Care | Payer: Medicare HMO | Source: Ambulatory Visit | Attending: Pulmonary Disease | Admitting: Pulmonary Disease

## 2014-02-19 DIAGNOSIS — J449 Chronic obstructive pulmonary disease, unspecified: Secondary | ICD-10-CM | POA: Diagnosis not present

## 2014-02-19 NOTE — Progress Notes (Signed)
Today, Lindsay Porter exercised at [employer]. [facility]. Service time was from 10:30am to 12:30pm.  The patient exercised for more than 31 minutes performing aerobic, strengthening, and stretching exercises. Oxygen saturation, heart rate, blood pressure, rate of perceived exertion, and shortness of breath were all monitored before, during, and after exercise. Lindsay Porter presented with no problems at today's exercise session. The patient attended education class with [facility] today regarding oxygen systems.   There was no workload change during today's exercise session.  Pre-exercise vitals:   Weight kg: 72.6   Liters of O2: ra   SpO2: 96   HR: 68   BP: 96/60   CBG: na  Exercise vitals:   Highest heartrate:  112   Lowest oxygen saturation: 94   Highest blood pressure: 108/58   Liters of 02: ra  Post-exercise vitals:   SpO2: 94   HR: 69   BP: 102/60   Liters of O2: ra   CBG: na  Dr. Brand Males, Medical Director Dr. Algis Liming is immediately available during today's Pulmonary Rehab session for Lindsay Porter on 02/19/14 at 10:30am class time.

## 2014-02-24 ENCOUNTER — Encounter (HOSPITAL_COMMUNITY)
Admission: RE | Admit: 2014-02-24 | Discharge: 2014-02-24 | Disposition: A | Discharge status: Home / Self Care | Payer: Medicare HMO | Source: Ambulatory Visit | Attending: Pulmonary Disease | Admitting: Pulmonary Disease

## 2014-02-24 DIAGNOSIS — J449 Chronic obstructive pulmonary disease, unspecified: Secondary | ICD-10-CM | POA: Diagnosis not present

## 2014-02-24 NOTE — Progress Notes (Signed)
Today, Lindsay Porter exercised at [employer]. Service time was from 1030 to 1215.  The patient exercised for more than 31 minutes performing aerobic, strengthening, and stretching exercises. Oxygen saturation, heart rate, blood pressure, rate of perceived exertion, and shortness of breath were all monitored before, during, and after exercise. [name] presented with no problems at today's exercise session.   There was no workload change during today's exercise session.  Pre-exercise vitals:   Weight kg: 72.5   Liters of O2: RA   SpO2: 95   HR: 73   BP: 98/60   CBG: NA  Exercise vitals:   Highest heartrate:  105   Lowest oxygen saturation: 92   Highest blood pressure: 134/76   Liters of 02: RA  Post-exercise vitals:   SpO2: 96   HR: 75   BP: 96/58   Liters of O2: RA   CBG: NA Dr. Brand Males, Medical Director Dr. Algis Liming is immediately available during today's Pulmonary Rehab session for [name] on 02/24/2014 at 1030 class time.

## 2014-02-26 ENCOUNTER — Encounter (HOSPITAL_COMMUNITY)
Admission: RE | Admit: 2014-02-26 | Discharge: 2014-02-26 | Disposition: A | Discharge status: Home / Self Care | Payer: Medicare HMO | Source: Ambulatory Visit | Attending: Pulmonary Disease | Admitting: Pulmonary Disease

## 2014-02-26 DIAGNOSIS — J449 Chronic obstructive pulmonary disease, unspecified: Secondary | ICD-10-CM | POA: Diagnosis not present

## 2014-02-26 NOTE — Progress Notes (Signed)
Today, Lindsay Porter exercised at [employer]. Service time was from 1100 to 1315.  The patient exercised for more than 31 minutes performing aerobic, strengthening, and stretching exercises. Oxygen saturation, heart rate, blood pressure, rate of perceived exertion, and shortness of breath were all monitored before, during, and after exercise. Lindsay Porter presented with no problems at today's exercise session. Patient attended the M.D. Day lecture.  There was no workload change during today's exercise session.  Pre-exercise vitals:   Weight kg: 73.2   Liters of O2: RA   SpO2: 94   HR: 71   BP: 114/64   CBG: NA  Exercise vitals:   Highest heartrate:  86   Lowest oxygen saturation: 97   Highest blood pressure: 116/60   Liters of 02: RA  Post-exercise vitals:   SpO2: 96   HR: 76   BP: 104/60   Liters of O2: RA   CBG: NA Dr. Brand Males, Medical Director Dr. Broadus John is immediately available during today's Pulmonary Rehab session for Lindsay Porter on 02/26/2014 at 1030 class time.

## 2014-03-03 ENCOUNTER — Encounter (HOSPITAL_COMMUNITY): Payer: Medicare HMO

## 2014-03-05 ENCOUNTER — Encounter (HOSPITAL_COMMUNITY)
Admission: RE | Admit: 2014-03-05 | Discharge: 2014-03-05 | Disposition: A | Discharge status: Home / Self Care | Payer: Medicare HMO | Source: Ambulatory Visit | Attending: Pulmonary Disease | Admitting: Pulmonary Disease

## 2014-03-05 DIAGNOSIS — J449 Chronic obstructive pulmonary disease, unspecified: Secondary | ICD-10-CM | POA: Diagnosis not present

## 2014-03-05 DIAGNOSIS — J4489 Other specified chronic obstructive pulmonary disease: Secondary | ICD-10-CM | POA: Insufficient documentation

## 2014-03-05 NOTE — Progress Notes (Signed)
Lindsay Porter 73 y.o. female Nutrition Note Spoke with pt. Pt is overweight. Pt eats 3 small meals a day. Pt c/o difficulty breathing if she eats a large meal. There are some ways the pt can make her eating habits healthier. Pt does not avoid salty food; uses canned/ convenience food and eats out 5 meals/week.  Pt adds salt to food. 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 Pt feels she only gets "about 1/3 of the seasoning packet" because she drains a lot of the water off. Eating more balanced meals and decreasing sodium consumption discussed. The role of sodium in lung disease reviewed with pt. Pt's Rate Your Plate results reviewed with pt. Pt reports difficulty swallowing at times "due to stricture and hiatal hernia." Pt encouraged to drink warm water to help relax the esophageal muscles before meals. Pt expressed understanding of the information reviewed.  Nutrition Diagnosis   Excessive sodium intake related to over consumption of processed food as evidenced by frequent consumption of convenience food/ canned vegetables and eating out frequently.   Food-and nutrition-related knowledge deficit related to lack of exposure to information as related to diagnosis of pulmonary disease   Overweight related to excessive energy intake as evidenced by a BMI of 28.2    Nutrition Intervention   Pt's individual nutrition plan and goals reviewed with pt.   Benefits of adopting healthy eating habits discussed when pt's Rate Your Plate reviewed.   Pt to attend the Nutrition and Lung Disease class   Handouts given for Healthier Choices when Eating Out   Continual client-centered nutrition education by RD, as part of interdisciplinary care. Goal(s) 1. Pt to identify and limit food sources of sodium. 2. The pt will recognize symptoms that can interfere with adequate oral intake, such as shortness of breath, N/V, early satiety, fatigue, ability  to secure and prepare food, taste and smell changes, chewing/swallowing difficulties, and/ or pain when eating. 3. Identify food quantities necessary to achieve wt loss  4. Describe the benefit of including fruits, vegetables, whole grains, and low-fat dairy products in a healthy meal plan. Monitor and Evaluate progress toward nutrition goal with team.   Derek Mound, M.Ed, RD, LDN, CDE 03/05/2014 12:21 PM

## 2014-03-05 NOTE — Progress Notes (Signed)
Today, Amenah exercised at [employer]. Service time was from 1030 to 1230.  The patient exercised for more than 31 minutes performing aerobic, strengthening, and stretching exercises. Oxygen saturation, heart rate, blood pressure, rate of perceived exertion, and shortness of breath were all monitored before, during, and after exercise. Stephanny presented with no problems at today's exercise session.  Patient attended the oxygen safety class today.   There was no workload change during today's exercise session.  Pre-exercise vitals:   Weight kg: 72.7   Liters of O2: RA   SpO2: 95   HR: 87   BP: 98/58   CBG: NA  Exercise vitals:   Highest heartrate:  88   Lowest oxygen saturation: 96   Highest blood pressure: 112/60   Liters of 02: RA  Post-exercise vitals:   SpO2: 95   HR: 85   BP: 112/64   Liters of O2: RA   CBG: NA Dr. Brand Males, Medical Director Dr. Wendee Beavers is immediately available during today's Pulmonary Rehab session for [place] on 03/05/2014 at 1030 class time.

## 2014-03-10 ENCOUNTER — Encounter (HOSPITAL_COMMUNITY): Payer: Medicare HMO

## 2014-03-10 ENCOUNTER — Telehealth (HOSPITAL_COMMUNITY): Payer: Self-pay | Admitting: Family Medicine

## 2014-03-12 ENCOUNTER — Encounter (HOSPITAL_COMMUNITY)
Admission: RE | Admit: 2014-03-12 | Discharge: 2014-03-12 | Disposition: A | Discharge status: Home / Self Care | Payer: Medicare HMO | Source: Ambulatory Visit | Attending: Pulmonary Disease | Admitting: Pulmonary Disease

## 2014-03-12 DIAGNOSIS — J449 Chronic obstructive pulmonary disease, unspecified: Secondary | ICD-10-CM | POA: Diagnosis not present

## 2014-03-12 NOTE — Progress Notes (Signed)
Today, Merle exercised at [employer]. Service time was from 10:30 to 12:30.  The patient exercised for more than 31 minutes performing aerobic, strengthening, and stretching exercises. Oxygen saturation, heart rate, blood pressure, rate of perceived exertion, and shortness of breath were all monitored before, during, and after exercise. Gracin presented with no problems at today's exercise session.  The patient attended education class on Nutrition with Derek Mound.  There was no workload change during today's exercise session.  Pre-exercise vitals:   Weight kg: 71.3   Liters of O2: ra   SpO2: 97   HR: 64   BP: 96/66   CBG: na  Exercise vitals:   Highest heartrate:  85   Lowest oxygen saturation: 94   Highest blood pressure: 124/62   Liters of 02: ra  Post-exercise vitals:   SpO2: 95   HR: 84   BP: 99/70   Liters of O2: ra   CBG: na  Dr. Brand Males, Medical Director Dr. Coralyn Pear is immediately available during today's Pulmonary Rehab session for [name] on 03/12/14 at 10:30am class time.

## 2014-03-17 ENCOUNTER — Encounter (HOSPITAL_COMMUNITY): Payer: Medicare HMO

## 2014-03-19 ENCOUNTER — Encounter (HOSPITAL_COMMUNITY)
Admission: RE | Admit: 2014-03-19 | Discharge: 2014-03-19 | Disposition: A | Discharge status: Home / Self Care | Payer: Medicare HMO | Source: Ambulatory Visit | Attending: Pulmonary Disease | Admitting: Pulmonary Disease

## 2014-03-19 DIAGNOSIS — J449 Chronic obstructive pulmonary disease, unspecified: Secondary | ICD-10-CM | POA: Diagnosis not present

## 2014-03-19 NOTE — Progress Notes (Signed)
Today, Lindsay Porter exercised at [employer]. Service time was from 1030 to 1230.  The patient exercised for more than 31 minutes performing aerobic, strengthening, and stretching exercises. Oxygen saturation, heart rate, blood pressure, rate of perceived exertion, and shortness of breath were all monitored before, during, and after exercise. Lindsay Porter presented with no problems at today's exercise session. Lindsay Porter also attended an education session on exercise for the pulmonary patient.  There was no workload change during today's exercise session.  Pre-exercise vitals:   Weight kg: 72.3   Liters of O2: ra   SpO2: 96   HR: 72   BP: 100/60   CBG: na  Exercise vitals:   Highest heartrate:  109   Lowest oxygen saturation: 94   Highest blood pressure: 100/70   Liters of 02: ra  Post-exercise vitals:   SpO2: 95   HR: 80   BP: 92/62 increased to 110/78 with hydration   Liters of O2: ra   CBG: na  Lindsay Porter, Medical Director Dr. Wyline Porter is immediately available during today's Pulmonary Rehab session for [place] on 03/19/2014 at 1030 class time.  Marland Kitchen

## 2014-03-24 ENCOUNTER — Encounter (HOSPITAL_COMMUNITY): Payer: Medicare HMO

## 2014-03-26 ENCOUNTER — Encounter (HOSPITAL_COMMUNITY): Payer: Medicare HMO

## 2014-03-31 ENCOUNTER — Encounter (HOSPITAL_COMMUNITY): Payer: Medicare HMO

## 2014-04-02 ENCOUNTER — Encounter (HOSPITAL_COMMUNITY)
Admission: RE | Admit: 2014-04-02 | Discharge: 2014-04-02 | Disposition: A | Discharge status: Home / Self Care | Payer: Medicare HMO | Source: Ambulatory Visit | Attending: Pulmonary Disease | Admitting: Pulmonary Disease

## 2014-04-02 ENCOUNTER — Encounter (HOSPITAL_COMMUNITY): Admission: RE | Admit: 2014-04-02 | Payer: Medicare HMO | Source: Ambulatory Visit

## 2014-04-02 NOTE — Progress Notes (Signed)
Today, Fallen exercised at [employer]. Service time was from 11:15am to 1:30pm.  The patient exercised for more than 31 minutes performing aerobic, strengthening, and stretching exercises. Oxygen saturation, heart rate, blood pressure, rate of perceived exertion, and shortness of breath were all monitored before, during, and after exercise. Lindsay Porter presented with no problems at today's exercise session. The patient attended MD Lecture Day with Dr. Nelda Marseille.  There was no workload change during today's exercise session.  Pre-exercise vitals:   Weight kg: 70.4   Liters of O2: ra   SpO2: 96   HR: 68   BP: 120/60   CBG: na  Exercise vitals:   Highest heartrate:  123   Lowest oxygen saturation: 96   Highest blood pressure: 110/68   Liters of 02: ra  Post-exercise vitals:   SpO2: 95   HR: 78   BP: 98/62   Liters of O2: ra   CBG: na  Dr. Brand Males, Medical Director Dr. Aileen Fass is immediately available during today's Pulmonary Rehab session for [name] on 04/02/14 at 10:30am class time.

## 2014-04-07 ENCOUNTER — Encounter (HOSPITAL_COMMUNITY): Payer: Medicare HMO

## 2014-04-09 ENCOUNTER — Telehealth (HOSPITAL_COMMUNITY): Payer: Self-pay

## 2014-04-09 ENCOUNTER — Encounter (HOSPITAL_COMMUNITY): Payer: Medicare HMO

## 2014-04-14 ENCOUNTER — Encounter (HOSPITAL_COMMUNITY): Payer: Medicare HMO

## 2014-04-14 NOTE — Progress Notes (Signed)
Discharge Note: Lindsay Porter is being discharged from pulmonary rehab per her request. [place] states she is financially not able to pay for the portion of the program that her insurance is not paying. She has requested an itemized statement from the billing department. Lindsay Porter completed 12 exercise session and verbalized that she saw great improvement in her stamina and strength. She had begin to be more active with her husband and had also began to exercise on her own, however she was inconsistent with home exercise. Her goal was to establish a regular exercise pattern. Her discharge PHQ2 was 0.

## 2014-04-16 ENCOUNTER — Encounter (HOSPITAL_COMMUNITY): Payer: Medicare HMO

## 2014-04-21 ENCOUNTER — Encounter (HOSPITAL_COMMUNITY): Payer: Medicare HMO

## 2014-04-23 ENCOUNTER — Encounter (HOSPITAL_COMMUNITY): Payer: Medicare HMO

## 2014-04-28 ENCOUNTER — Encounter (HOSPITAL_COMMUNITY): Payer: Medicare HMO

## 2014-04-30 ENCOUNTER — Encounter (HOSPITAL_COMMUNITY): Payer: Medicare HMO

## 2014-05-05 ENCOUNTER — Encounter (HOSPITAL_COMMUNITY): Payer: Medicare HMO

## 2014-05-07 ENCOUNTER — Encounter (HOSPITAL_COMMUNITY): Payer: Medicare HMO

## 2014-05-12 ENCOUNTER — Encounter (HOSPITAL_COMMUNITY): Payer: Medicare HMO

## 2014-05-14 ENCOUNTER — Encounter (HOSPITAL_COMMUNITY): Payer: Medicare HMO

## 2014-06-02 ENCOUNTER — Ambulatory Visit (INDEPENDENT_AMBULATORY_CARE_PROVIDER_SITE_OTHER): Payer: Self-pay | Admitting: Surgery

## 2014-06-02 NOTE — H&P (Signed)
History of Present Illness Lindsay Porter. Clorine Swing MD; 06/02/2014 4:58 PM) Patient words: hernia.  The patient is a 73 year old female who presents with an inguinal hernia. This is a 73 yo female who presents with several months of a "hot" burning sensation in her right groin. Over the last several days, she has developed a large bulge in this area. The bulge is reducible when she is supine, but comes out as soon as she stands up. She denies any constipation. She had a laparoscopic hiatal hernia repair by Dr. Excell Seltzer in 2013, but still has occasional swallowing and digestive issues. 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 She comes in today for evaluation of her inguinal hernia.  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  Other Problems Lindsay Porter, CMA; 06/02/2014 4:18 PM) Alcohol Abuse Arthritis Back Pain Chronic Obstructive Lung Disease Gastroesophageal Reflux Disease Lump In Breast Other disease, cancer, significant illness  Past Surgical History Lindsay Porter, CMA; 06/02/2014 4:18 PM) Appendectomy Breast Biopsy Left. Breast Mass; Local Excision Left. Foot Surgery Left. Knee Surgery Left. Nissen Fundoplication Tonsillectomy  Diagnostic Studies History Lindsay Porter, CMA; 06/02/2014 4:18 PM) Colonoscopy 1-5 years ago Mammogram within last year Pap Smear >5 years ago  Allergies Lindsay Porter, CMA; 06/02/2014 4:20 PM) Sulfa Antibiotics Singulair *ANTIASTHMATIC AND BRONCHODILATOR AGENTS*  Medication History (Sonya Bynum, CMA; 06/02/2014 4:20 PM) Sertraline HCl (50MG  Tablet, Oral) Active. Symbicort (160-4.5MCG/ACT Aerosol, Inhalation) Active. ROPINIRole HCl (2MG  Tablet, Oral) Active.  Social History ([place]; 06/02/2014 4:18 PM) Alcohol use Remotely quit alcohol use. Caffeine use Coffee. No drug use Tobacco use Former smoker.  Family History Lindsay Porter, CMA; 06/02/2014  4:18 PM) Alcohol Abuse Father. Arthritis Mother, Sister. Ischemic Bowel Disease Mother. Respiratory Condition Mother.  Pregnancy / Birth History Lindsay Porter, CMA; 06/02/2014 4:18 PM) Age of menopause 51-55 Contraceptive History Oral contraceptives. Gravida 2 Maternal age 63-30 Para 1     Review of Systems (Lindsay Porter; 06/02/2014 4:18 PM) General Present- Appetite Loss and Fatigue. Not Present- Chills, Fever, Night Sweats, Weight Gain and Weight Loss. Skin Present- Dryness and Hives. Not Present- Change in Wart/Mole, Jaundice, New Lesions, Non-Healing Wounds, Rash and Ulcer. HEENT Present- Sore Throat and Wears glasses/contact lenses. Not Present- Earache, Hearing Loss, Hoarseness, Nose Bleed, Oral Ulcers, Ringing in the Ears, Seasonal Allergies, Sinus Pain, Visual Disturbances and Yellow Eyes. Respiratory Present- Difficulty Breathing and Snoring. Not Present- Bloody sputum, Chronic Cough and Wheezing. Breast Not Present- Breast Mass, Breast Pain, Nipple Discharge and Skin Changes. Cardiovascular Present- Shortness of Breath. Not Present- Chest Pain, Difficulty Breathing Lying Down, Leg Cramps, Palpitations, Rapid Heart Rate and Swelling of Extremities. Gastrointestinal Present- Abdominal Pain, Bloating, Difficulty Swallowing and Gets full quickly at meals. Not Present- Bloody Stool, Change in Bowel Habits, Chronic diarrhea, Constipation, Excessive gas, Hemorrhoids, Indigestion, Nausea, Rectal Pain and Vomiting. Female Genitourinary Not Present- Frequency, Nocturia, Painful Urination, Pelvic Pain and Urgency. Musculoskeletal Present- Back Pain, Joint Pain and Joint Stiffness. Not Present- Muscle Pain, Muscle Weakness and Swelling of Extremities. Neurological Present- Decreased Memory and Headaches. Not Present- Fainting, Numbness, Seizures, Tingling, Tremor, Trouble walking and Weakness. Psychiatric Not Present- Anxiety, Bipolar, Change in Sleep Pattern, Depression, Fearful  and Frequent crying. Endocrine Not Present- Cold Intolerance, Excessive Hunger, Hair Changes, Heat Intolerance, Hot flashes and New Diabetes. Hematology Not Present- Easy Bruising, Excessive bleeding, Gland problems, HIV and Persistent Infections.  Vitals (Sonya Bynum CMA; 06/02/2014 4:20 PM) 06/02/2014 4:19 PM Weight:  153 lb Height: 65in Body Surface Area: 1.78 m Body Mass Index: 25.46 kg/m Temp.: 91F(Temporal)  Pulse: 73 (Regular)  BP: 122/78 (Sitting, Left Arm, Standard)     Physical Exam Rodman Key K. Beonka Amesquita MD; 06/02/2014 4:59 PM)  The physical exam findings are as follows: Note:WDWN in NAD HEENT: EOMI, sclera anicteric Neck: No masses, no thyromegaly Lungs: CTA bilaterally; normal respiratory effort CV: Regular rate and rhythm; no murmurs Abd: +bowel sounds, soft, non-tender, no masses GU: no sign of left inguinal hernia; large right inguinal hernia containing bowel - easily reducible Ext: Well-perfused; no edema Skin: Warm, dry; no sign of jaundice    Assessment & Plan Rodman Key K. Evanell Redlich MD; 06/02/2014 4:46 PM)  REDUCIBLE RIGHT INGUINAL HERNIA (550.90  K40.90)  Current Plans Schedule for Surgery - right inguinal hernia repair with mesh. The surgical procedure has been discussed with the patient. Potential risks, benefits, alternative treatments, and expected outcomes have been explained. All of the patient's questions at this time have been answered. The likelihood of reaching the patient's treatment goal is good. The patient understand the proposed surgical procedure and wishes to proceed.   Lindsay Porter. Georgette Dover, MD, [facility]  General/ Trauma Surgery  06/02/2014 5:00 PM

## 2014-06-03 ENCOUNTER — Encounter (HOSPITAL_BASED_OUTPATIENT_CLINIC_OR_DEPARTMENT_OTHER): Payer: Self-pay | Admitting: *Deleted

## 2014-06-03 NOTE — Progress Notes (Signed)
No labs per anesth needed 

## 2014-06-04 ENCOUNTER — Ambulatory Visit (HOSPITAL_BASED_OUTPATIENT_CLINIC_OR_DEPARTMENT_OTHER)
Admission: RE | Admit: 2014-06-04 | Discharge: 2014-06-04 | Disposition: A | Discharge status: Home / Self Care | Payer: Medicare HMO | Source: Ambulatory Visit | Attending: Surgery | Admitting: Surgery

## 2014-06-04 ENCOUNTER — Ambulatory Visit (HOSPITAL_BASED_OUTPATIENT_CLINIC_OR_DEPARTMENT_OTHER): Payer: Medicare HMO | Admitting: Anesthesiology

## 2014-06-04 ENCOUNTER — Encounter (HOSPITAL_BASED_OUTPATIENT_CLINIC_OR_DEPARTMENT_OTHER): Payer: Self-pay | Admitting: Anesthesiology

## 2014-06-04 ENCOUNTER — Encounter (HOSPITAL_BASED_OUTPATIENT_CLINIC_OR_DEPARTMENT_OTHER)
Admission: RE | Disposition: A | Discharge status: Home / Self Care | Payer: Self-pay | Source: Ambulatory Visit | Attending: Surgery

## 2014-06-04 DIAGNOSIS — Z8261 Family history of arthritis: Secondary | ICD-10-CM | POA: Insufficient documentation

## 2014-06-04 DIAGNOSIS — F1010 Alcohol abuse, uncomplicated: Secondary | ICD-10-CM | POA: Diagnosis not present

## 2014-06-04 DIAGNOSIS — M1990 Unspecified osteoarthritis, unspecified site: Secondary | ICD-10-CM | POA: Insufficient documentation

## 2014-06-04 DIAGNOSIS — Z87891 Personal history of nicotine dependence: Secondary | ICD-10-CM | POA: Insufficient documentation

## 2014-06-04 DIAGNOSIS — Z811 Family history of alcohol abuse and dependence: Secondary | ICD-10-CM | POA: Insufficient documentation

## 2014-06-04 DIAGNOSIS — K4090 Unilateral inguinal hernia, without obstruction or gangrene, not specified as recurrent: Secondary | ICD-10-CM | POA: Diagnosis not present

## 2014-06-04 DIAGNOSIS — K219 Gastro-esophageal reflux disease without esophagitis: Secondary | ICD-10-CM | POA: Diagnosis not present

## 2014-06-04 DIAGNOSIS — F329 Major depressive disorder, single episode, unspecified: Secondary | ICD-10-CM | POA: Insufficient documentation

## 2014-06-04 DIAGNOSIS — Z9889 Other specified postprocedural states: Secondary | ICD-10-CM | POA: Diagnosis not present

## 2014-06-04 DIAGNOSIS — J449 Chronic obstructive pulmonary disease, unspecified: Secondary | ICD-10-CM | POA: Diagnosis not present

## 2014-06-04 HISTORY — DX: Presence of spectacles and contact lenses: Z97.3

## 2014-06-04 HISTORY — PX: INGUINAL HERNIA REPAIR: SHX194

## 2014-06-04 HISTORY — PX: INSERTION OF MESH: SHX5868

## 2014-06-04 LAB — POCT HEMOGLOBIN-HEMACUE: Hemoglobin: 13.6 g/dL (ref 12.0–15.0)

## 2014-06-04 SURGERY — REPAIR, HERNIA, INGUINAL, ADULT
Anesthesia: General | Site: Groin | Laterality: Right

## 2014-06-04 MED ORDER — FENTANYL CITRATE 0.05 MG/ML IJ SOLN
INTRAMUSCULAR | Status: AC
  Filled 2014-06-04: qty 2

## 2014-06-04 MED ORDER — PROMETHAZINE HCL 25 MG/ML IJ SOLN: 6.2500 mg | INTRAMUSCULAR | Status: DC | PRN

## 2014-06-04 MED ORDER — OXYCODONE HCL 5 MG PO TABS
ORAL_TABLET | ORAL | Status: AC
  Filled 2014-06-04: qty 1

## 2014-06-04 MED ORDER — CEFAZOLIN SODIUM-DEXTROSE 2-3 GM-% IV SOLR
INTRAVENOUS | Status: AC
  Filled 2014-06-04: qty 50

## 2014-06-04 MED ORDER — MORPHINE SULFATE 2 MG/ML IJ SOLN: 2.0000 mg | INTRAMUSCULAR | Status: DC | PRN

## 2014-06-04 MED ORDER — PROPOFOL 10 MG/ML IV BOLUS
INTRAVENOUS | Status: DC | PRN
  Administered 2014-06-04: 200 mg via INTRAVENOUS

## 2014-06-04 MED ORDER — FENTANYL CITRATE 0.05 MG/ML IJ SOLN
50.0000 ug | INTRAMUSCULAR | Status: DC | PRN
  Administered 2014-06-04: 100 ug via INTRAVENOUS

## 2014-06-04 MED ORDER — MIDAZOLAM HCL 2 MG/2ML IJ SOLN
INTRAMUSCULAR | Status: AC
  Filled 2014-06-04: qty 2

## 2014-06-04 MED ORDER — OXYCODONE HCL 5 MG/5ML PO SOLN: 5.0000 mg | Freq: Once | ORAL | Status: AC | PRN

## 2014-06-04 MED ORDER — CEFAZOLIN SODIUM-DEXTROSE 2-3 GM-% IV SOLR
INTRAVENOUS | Status: AC
Start: 2014-06-04 — End: 2014-06-04
  Filled 2014-06-04: qty 50

## 2014-06-04 MED ORDER — HYDROMORPHONE HCL 1 MG/ML IJ SOLN: 0.2500 mg | INTRAMUSCULAR | Status: DC | PRN

## 2014-06-04 MED ORDER — MIDAZOLAM HCL 2 MG/2ML IJ SOLN
1.0000 mg | INTRAMUSCULAR | Status: DC | PRN
  Administered 2014-06-04: 2 mg via INTRAVENOUS

## 2014-06-04 MED ORDER — DEXAMETHASONE SODIUM PHOSPHATE 4 MG/ML IJ SOLN
INTRAMUSCULAR | Status: DC | PRN
  Administered 2014-06-04: 10 mg via INTRAVENOUS

## 2014-06-04 MED ORDER — BUPIVACAINE-EPINEPHRINE (PF) 0.5% -1:200000 IJ SOLN
INTRAMUSCULAR | Status: AC
  Filled 2014-06-04: qty 30

## 2014-06-04 MED ORDER — EPHEDRINE SULFATE 50 MG/ML IJ SOLN
INTRAMUSCULAR | Status: DC | PRN
  Administered 2014-06-04 (×2): 10 mg via INTRAVENOUS

## 2014-06-04 MED ORDER — BUPIVACAINE-EPINEPHRINE (PF) 0.5% -1:200000 IJ SOLN
INTRAMUSCULAR | Status: DC | PRN
  Administered 2014-06-04: 30 mL

## 2014-06-04 MED ORDER — HYDROCODONE-ACETAMINOPHEN 5-325 MG PO TABS: 1.0000 | ORAL_TABLET | ORAL | Status: DC | PRN

## 2014-06-04 MED ORDER — OXYCODONE HCL 5 MG PO TABS
5.0000 mg | ORAL_TABLET | Freq: Once | ORAL | Status: AC | PRN
  Administered 2014-06-04: 5 mg via ORAL

## 2014-06-04 MED ORDER — FENTANYL CITRATE 0.05 MG/ML IJ SOLN
INTRAMUSCULAR | Status: AC
  Filled 2014-06-04: qty 4

## 2014-06-04 MED ORDER — BUPIVACAINE-EPINEPHRINE (PF) 0.25% -1:200000 IJ SOLN
INTRAMUSCULAR | Status: AC
  Filled 2014-06-04: qty 30

## 2014-06-04 MED ORDER — LACTATED RINGERS IV SOLN
INTRAVENOUS | Status: DC
  Administered 2014-06-04 (×2): via INTRAVENOUS

## 2014-06-04 MED ORDER — IBUPROFEN 600 MG PO TABS: 600.0000 mg | ORAL_TABLET | Freq: Four times a day (QID) | ORAL | Status: DC | PRN

## 2014-06-04 MED ORDER — CHLORHEXIDINE GLUCONATE 4 % EX LIQD: 1.0000 "application " | Freq: Once | CUTANEOUS | Status: DC

## 2014-06-04 MED ORDER — ONDANSETRON HCL 4 MG/2ML IJ SOLN
INTRAMUSCULAR | Status: DC | PRN
  Administered 2014-06-04: 4 mg via INTRAVENOUS

## 2014-06-04 MED ORDER — ONDANSETRON HCL 4 MG/2ML IJ SOLN: 4.0000 mg | INTRAMUSCULAR | Status: DC | PRN

## 2014-06-04 MED ORDER — BUPIVACAINE-EPINEPHRINE 0.25% -1:200000 IJ SOLN
INTRAMUSCULAR | Status: DC | PRN
  Administered 2014-06-04: 10 mL

## 2014-06-04 MED ORDER — FENTANYL CITRATE 0.05 MG/ML IJ SOLN
INTRAMUSCULAR | Status: DC | PRN
  Administered 2014-06-04: 50 ug via INTRAVENOUS
  Administered 2014-06-04: 100 ug via INTRAVENOUS

## 2014-06-04 MED ORDER — CEFAZOLIN SODIUM-DEXTROSE 2-3 GM-% IV SOLR
2.0000 g | INTRAVENOUS | Status: AC
  Administered 2014-06-04: 2 g via INTRAVENOUS

## 2014-06-04 SURGICAL SUPPLY — 50 items
BENZOIN TINCTURE PRP APPL 2/3 (GAUZE/BANDAGES/DRESSINGS) ×2 IMPLANT
BLADE CLIPPER SURG (BLADE) IMPLANT
BLADE HEX COATED 2.75 (ELECTRODE) ×2 IMPLANT
BLADE SURG 15 STRL LF DISP TIS (BLADE) ×2 IMPLANT
BLADE SURG 15 STRL SS (BLADE) ×2
CANISTER SUCT 1200ML W/VALVE (MISCELLANEOUS) IMPLANT
CHLORAPREP W/TINT 26ML (MISCELLANEOUS) ×2 IMPLANT
COVER BACK TABLE 60X90IN (DRAPES) ×2 IMPLANT
COVER MAYO STAND STRL (DRAPES) ×2 IMPLANT
DECANTER SPIKE VIAL GLASS SM (MISCELLANEOUS) IMPLANT
DRAIN PENROSE 1/2X12 LTX STRL (WOUND CARE) ×2 IMPLANT
DRAPE LAPAROTOMY TRNSV 102X78 (DRAPE) ×2 IMPLANT
DRAPE UTILITY XL STRL (DRAPES) ×2 IMPLANT
DRSG TEGADERM 4X4.75 (GAUZE/BANDAGES/DRESSINGS) ×2 IMPLANT
ELECT REM PT RETURN 9FT ADLT (ELECTROSURGICAL) ×2
ELECTRODE REM PT RTRN 9FT ADLT (ELECTROSURGICAL) ×1 IMPLANT
GLOVE BIO SURGEON STRL SZ7 (GLOVE) ×2 IMPLANT
GLOVE BIOGEL PI IND STRL 7.5 (GLOVE) ×1 IMPLANT
GLOVE BIOGEL PI INDICATOR 7.5 (GLOVE) ×1
GLOVE SURG SS PI 7.0 STRL IVOR (GLOVE) ×4 IMPLANT
GOWN STRL REUS W/ TWL LRG LVL3 (GOWN DISPOSABLE) ×2 IMPLANT
GOWN STRL REUS W/TWL LRG LVL3 (GOWN DISPOSABLE) ×2
MESH PARIETEX PROGRIP RIGHT (Mesh General) ×2 IMPLANT
NEEDLE HYPO 25X1 1.5 SAFETY (NEEDLE) ×2 IMPLANT
NS IRRIG 1000ML POUR BTL (IV SOLUTION) ×2 IMPLANT
PACK BASIN DAY SURGERY FS (CUSTOM PROCEDURE TRAY) ×2 IMPLANT
PENCIL BUTTON HOLSTER BLD 10FT (ELECTRODE) ×2 IMPLANT
SLEEVE SCD COMPRESS KNEE MED (MISCELLANEOUS) ×2 IMPLANT
SPONGE GAUZE 4X4 12PLY STER LF (GAUZE/BANDAGES/DRESSINGS) ×2 IMPLANT
SPONGE INTESTINAL PEANUT (DISPOSABLE) ×2 IMPLANT
SPONGE LAP 18X18 X RAY DECT (DISPOSABLE) IMPLANT
STRIP CLOSURE SKIN 1/2X4 (GAUZE/BANDAGES/DRESSINGS) ×2 IMPLANT
SUT MON AB 4-0 PC3 18 (SUTURE) ×2 IMPLANT
SUT PDS AB 0 CT 36 (SUTURE) IMPLANT
SUT SILK 2 0 SH (SUTURE) IMPLANT
SUT SILK 3 0 SH 30 (SUTURE) IMPLANT
SUT SILK 3 0 TIES 17X18 (SUTURE)
SUT SILK 3-0 18XBRD TIE BLK (SUTURE) IMPLANT
SUT VIC AB 0 CT1 27 (SUTURE) ×1
SUT VIC AB 0 CT1 27XBRD ANBCTR (SUTURE) ×1 IMPLANT
SUT VIC AB 0 SH 27 (SUTURE) IMPLANT
SUT VIC AB 2-0 SH 27 (SUTURE) ×1
SUT VIC AB 2-0 SH 27XBRD (SUTURE) ×1 IMPLANT
SUT VIC AB 3-0 SH 27 (SUTURE) ×1
SUT VIC AB 3-0 SH 27X BRD (SUTURE) ×1 IMPLANT
SYR CONTROL 10ML LL (SYRINGE) ×2 IMPLANT
TOWEL OR 17X24 6PK STRL BLUE (TOWEL DISPOSABLE) ×2 IMPLANT
TOWEL OR NON WOVEN STRL DISP B (DISPOSABLE) ×2 IMPLANT
TUBE CONNECTING 20X1/4 (TUBING) IMPLANT
YANKAUER SUCT BULB TIP NO VENT (SUCTIONS) IMPLANT

## 2014-06-04 NOTE — Discharge Instructions (Signed)
[facility], [place]   INGUINAL HERNIA REPAIR: POST OP INSTRUCTIONS  Always review your discharge instruction sheet given to you by the facility where your surgery was performed. IF YOU HAVE DISABILITY OR FAMILY LEAVE FORMS, YOU MUST BRING THEM TO THE OFFICE FOR PROCESSING.   DO NOT GIVE THEM TO YOUR DOCTOR.  1. A  prescription for pain medication may be given to you upon discharge.  Take your pain medication as prescribed, if needed.  If narcotic pain medicine is not needed, then you may take acetaminophen (Tylenol) or ibuprofen (Advil) as needed. 2. Take your usually prescribed medications unless otherwise directed. 3. If you need a refill on your pain medication, please contact your pharmacy.  They will contact our office to request authorization. Prescriptions will not be filled after 5 pm or on week-ends. 4. You should follow a light diet the first 24 hours after arrival home, such as soup and crackers, etc.  Be sure to include lots of fluids daily.  Resume your normal diet the day after surgery. 5. Most patients will experience some swelling and bruising in the groin.  Ice packs and reclining will help.  Swelling and bruising can take several days to resolve.  6. It is common to experience some constipation if taking pain medication after surgery.  Increasing fluid intake and taking a stool softener (such as Colace) will usually help or prevent this problem from occurring.  A mild laxative (Milk of Magnesia or Miralax) should be taken according to package directions if there are no bowel movements after 48 hours. 7. Unless discharge instructions indicate otherwise, you may remove your bandages 24-48 hours after surgery, and you may shower at that time.  You will have steri-strips (small skin tapes) in place directly over the incision.  These strips should be left on the skin for 7-10 days. 8. ACTIVITIES:  You may resume regular (light) daily activities beginning the next day--such as  daily self-care, walking, climbing stairs--gradually increasing activities as tolerated.  You may have sexual intercourse when it is comfortable.  Refrain from any heavy lifting or straining until approved by your doctor. a. You may drive when you are no longer taking prescription pain medication, you can comfortably wear a seatbelt, and you can safely maneuver your car and apply brakes. b. RETURN TO WORK:  2-3 weeks with light duty - no lifting over 15 lbs. 9. You should see your doctor in the office for a follow-up appointment approximately 2-3 weeks after your surgery.  Make sure that you call for this appointment within a day or two after you arrive home to insure a convenient appointment time. 10. OTHER INSTRUCTIONS:  __________________________________________________________________________________________________________________________________________________________________________________________  WHEN TO CALL YOUR DOCTOR: 1. Fever over 101.0 2. Inability to urinate 3. Nausea and/or vomiting 4. Extreme swelling or bruising 5. Continued bleeding from incision. 6. Increased pain, redness, or drainage from the incision  The clinic staff is available to answer your questions during regular business hours.  Please dont hesitate to call and ask to speak to one of the nurses for clinical concerns.  If you have a medical emergency, go to the nearest emergency room or call 911.  A surgeon from [facility] is always on call at the hospital   [address] [place], [place], [place]  [address] ?  [address], [place], [place]   [address] [phone]    FAX [phone] Web site: [email or web address]  Care Instructions  Activity: Get plenty of rest for the remainder of the day. A responsible adult should stay with you for 24 hours following the procedure.  For the next 24 hours, DO NOT: -Drive a car -Conservation officer, nature -Drink alcoholic beverages -Take any medication unless instructed by your physician -Make any legal decisions or sign important papers.  Meals: Start with liquid foods such as gelatin or soup. Progress to regular foods as tolerated. Avoid greasy, spicy, heavy foods. If nausea and/or vomiting occur, drink only clear liquids until the nausea and/or vomiting subsides. Call your physician if vomiting continues.  Special Instructions/Symptoms: Your throat may feel dry or sore from the anesthesia or the breathing tube placed in your throat during surgery. If this causes discomfort, gargle with warm salt water. The discomfort should disappear within 24 hours.

## 2014-06-04 NOTE — Op Note (Signed)
Hernia, Open, Procedure Note  Indications: The patient presented with a history of a right, reducible inguinal hernia.    Pre-operative Diagnosis: right reducible inguinal hernia Post-operative Diagnosis: same  Surgeon: Maia Petties.   Assistants: none  Anesthesia: General endotracheal anesthesia and TAP block  ASA Class: 2  Procedure Details  The patient was seen again in the Holding Room. The risks, benefits, complications, treatment options, and expected outcomes were discussed with the patient. The possibilities of reaction to medication, pulmonary aspiration, perforation of viscus, bleeding, recurrent infection, the need for additional procedures, and development of a complication requiring transfusion or further operation were discussed with the patient and/or family. The likelihood of success in repairing the hernia and returning the patient to their previous functional status is good.  There was concurrence with the proposed plan, and informed consent was obtained. The site of surgery was properly noted/marked. The patient was taken to the Operating Room, identified as Lindsay Porter, and the procedure verified as right inguinal hernia repair. A Time Out was held and the above information confirmed.  The patient was placed in the supine position and underwent induction of anesthesia. The lower abdomen and groin was prepped with Chloraprep and draped in the standard fashion, and 0.25% Marcaine with epinephrine was used to anesthetize the skin over the mid-portion of the inguinal ligament. An oblique incision was made. Dissection was carried down through the subcutaneous tissue with cautery to the external oblique fascia.  We opened the external oblique fascia along the direction of its fibers to the external ring.  The ilioinguinal nerve was identified and preserved.  The floor of the inguinal canal was inspected and showed a large direct hernia defect.  The round ligament was ligated  with 2-0 Vicryl and divided.  The direct hernia was reduced and the floor of the inguinal canal was closed with 0 Vicryl  We used a right-sided Progrip mesh which was inserted and deployed across the floor of the inguinal canal. The mesh was tucked underneath the external oblique fascia laterally.  The mesh was secured to the pubic tubercle with 0 Vicryl.  The small opening in the mesh for the spermatic cord was closed with 2-0 Vicryl. The external oblique fascia was reapproximated with 2-0 Vicryl.  3-0 Vicryl was used to close the subcutaneous tissues and 4-0 Monocryl was used to close the skin in subcuticular fashion.  Benzoin and steri-strips were used to seal the incision.  A clean dressing was applied.  The patient was then extubated and brought to the recovery room in stable condition.  All sponge, instrument, and needle counts were correct prior to closure and at the conclusion of the case.   Estimated Blood Loss: Minimal                 Complications: None; patient tolerated the procedure well.         Disposition: PACU - hemodynamically stable.         Condition: stable   Imogene Burn. Georgette Dover, MD, [facility]  General/ Trauma Surgery  06/04/2014 11:24 AM

## 2014-06-04 NOTE — Anesthesia Postprocedure Evaluation (Signed)
Anesthesia Post Note  Patient: Lindsay Porter  Procedure(s) Performed: Procedure(s) (LRB): HERNIA REPAIR INGUINAL ADULT (Right) INSERTION OF MESH (Right)  Anesthesia type: general  Patient location: PACU  Post pain: Pain level controlled  Post assessment: Patient's Cardiovascular Status Stable  Last Vitals:  Filed Vitals:   06/04/14 1215  BP: 117/76  Pulse: 68  Temp:   Resp:     Post vital signs: Reviewed and stable  Level of consciousness: sedated  Complications: No apparent anesthesia complications

## 2014-06-04 NOTE — Interval H&P Note (Signed)
History and Physical Interval Note:  06/04/2014 8:18 AM  Lindsay Porter  has presented today for surgery, with the diagnosis of right inguinal hernia  The various methods of treatment have been discussed with the patient and family. After consideration of risks, benefits and other options for treatment, the patient has consented to  Procedure(s): HERNIA REPAIR INGUINAL ADULT (Right) INSERTION OF MESH (Right) as a surgical intervention .  The patient's history has been reviewed, patient examined, no change in status, stable for surgery.  I have reviewed the patient's chart and labs.  Questions were answered to the patient's satisfaction.     Jhalen Eley K.

## 2014-06-04 NOTE — H&P (View-Only) (Signed)
History of Present Illness Lindsay Porter. Garet Hooton MD; 06/02/2014 4:58 PM) Patient words: hernia.  The patient is a 73 year old female who presents with an inguinal hernia. This is a 73 yo female who presents with several months of a "hot" burning sensation in her right groin. Over the last several days, she has developed a large bulge in this area. The bulge is reducible when she is supine, but comes out as soon as she stands up. She denies any constipation. She had a laparoscopic hiatal hernia repair by Dr. Excell Seltzer in 2013, but still has occasional swallowing and digestive issues. 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 She comes in today for evaluation of her inguinal hernia.  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  Other Problems Marjean Donna, CMA; 06/02/2014 4:18 PM) Alcohol Abuse Arthritis Back Pain Chronic Obstructive Lung Disease Gastroesophageal Reflux Disease Lump In Breast Other disease, cancer, significant illness  Past Surgical History Marjean Donna, CMA; 06/02/2014 4:18 PM) Appendectomy Breast Biopsy Left. Breast Mass; Local Excision Left. Foot Surgery Left. Knee Surgery Left. Nissen Fundoplication Tonsillectomy  Diagnostic Studies History Marjean Donna, CMA; 06/02/2014 4:18 PM) Colonoscopy 1-5 years ago Mammogram within last year Pap Smear >5 years ago  Allergies Marjean Donna, CMA; 06/02/2014 4:20 PM) Sulfa Antibiotics Singulair *ANTIASTHMATIC AND BRONCHODILATOR AGENTS*  Medication History (Sonya Bynum, CMA; 06/02/2014 4:20 PM) Sertraline HCl (50MG  Tablet, Oral) Active. Symbicort (160-4.5MCG/ACT Aerosol, Inhalation) Active. ROPINIRole HCl (2MG  Tablet, Oral) Active.  Social History ([place]; 06/02/2014 4:18 PM) Alcohol use Remotely quit alcohol use. Caffeine use Coffee. No drug use Tobacco use Former smoker.  Family History Marjean Donna, CMA; 06/02/2014  4:18 PM) Alcohol Abuse Father. Arthritis Mother, Sister. Ischemic Bowel Disease Mother. Respiratory Condition Mother.  Pregnancy / Birth History Marjean Donna, CMA; 06/02/2014 4:18 PM) Age of menopause 51-55 Contraceptive History Oral contraceptives. Gravida 2 Maternal age 75-30 Para 1     Review of Systems ([place]; 06/02/2014 4:18 PM) General Present- Appetite Loss and Fatigue. Not Present- Chills, Fever, Night Sweats, Weight Gain and Weight Loss. Skin Present- Dryness and Hives. Not Present- Change in Wart/Mole, Jaundice, New Lesions, Non-Healing Wounds, Rash and Ulcer. HEENT Present- Sore Throat and Wears glasses/contact lenses. Not Present- Earache, Hearing Loss, Hoarseness, Nose Bleed, Oral Ulcers, Ringing in the Ears, Seasonal Allergies, Sinus Pain, Visual Disturbances and Yellow Eyes. Respiratory Present- Difficulty Breathing and Snoring. Not Present- Bloody sputum, Chronic Cough and Wheezing. Breast Not Present- Breast Mass, Breast Pain, Nipple Discharge and Skin Changes. Cardiovascular Present- Shortness of Breath. Not Present- Chest Pain, Difficulty Breathing Lying Down, Leg Cramps, Palpitations, Rapid Heart Rate and Swelling of Extremities. Gastrointestinal Present- Abdominal Pain, Bloating, Difficulty Swallowing and Gets full quickly at meals. Not Present- Bloody Stool, Change in Bowel Habits, Chronic diarrhea, Constipation, Excessive gas, Hemorrhoids, Indigestion, Nausea, Rectal Pain and Vomiting. Female Genitourinary Not Present- Frequency, Nocturia, Painful Urination, Pelvic Pain and Urgency. Musculoskeletal Present- Back Pain, Joint Pain and Joint Stiffness. Not Present- Muscle Pain, Muscle Weakness and Swelling of Extremities. Neurological Present- Decreased Memory and Headaches. Not Present- Fainting, Numbness, Seizures, Tingling, Tremor, Trouble walking and Weakness. Psychiatric Not Present- Anxiety, Bipolar, Change in Sleep Pattern, Depression, Fearful  and Frequent crying. Endocrine Not Present- Cold Intolerance, Excessive Hunger, Hair Changes, Heat Intolerance, Hot flashes and New Diabetes. Hematology Not Present- Easy Bruising, Excessive bleeding, Gland problems, HIV and Persistent Infections.  Vitals (Sonya Bynum CMA; 06/02/2014 4:20 PM) 06/02/2014 4:19 PM Weight:  153 lb Height: 65in Body Surface Area: 1.78 m Body Mass Index: 25.46 kg/m Temp.: 70F(Temporal)  Pulse: 73 (Regular)  BP: 122/78 (Sitting, Left Arm, Standard)     Physical Exam Rodman Key K. Haron Beilke MD; 06/02/2014 4:59 PM)  The physical exam findings are as follows: Note:WDWN in NAD HEENT: EOMI, sclera anicteric Neck: No masses, no thyromegaly Lungs: CTA bilaterally; normal respiratory effort CV: Regular rate and rhythm; no murmurs Abd: +bowel sounds, soft, non-tender, no masses GU: no sign of left inguinal hernia; large right inguinal hernia containing bowel - easily reducible Ext: Well-perfused; no edema Skin: Warm, dry; no sign of jaundice    Assessment & Plan Rodman Key K. Lucy Woolever MD; 06/02/2014 4:46 PM)  REDUCIBLE RIGHT INGUINAL HERNIA (550.90  K40.90)  Current Plans Schedule for Surgery - right inguinal hernia repair with mesh. The surgical procedure has been discussed with the patient. Potential risks, benefits, alternative treatments, and expected outcomes have been explained. All of the patient's questions at this time have been answered. The likelihood of reaching the patient's treatment goal is good. The patient understand the proposed surgical procedure and wishes to proceed.   Lindsay Porter. Georgette Dover, MD, [facility]  General/ Trauma Surgery  06/02/2014 5:00 PM

## 2014-06-04 NOTE — Anesthesia Preprocedure Evaluation (Signed)
Anesthesia Evaluation    Reviewed: Allergy & Precautions, H&P , NPO status   History of Anesthesia Complications Negative for: history of anesthetic complications  Airway        Dental   Pulmonary COPD COPD inhaler, former smoker,          Cardiovascular negative cardio ROS      Neuro/Psych PSYCHIATRIC DISORDERS Depression negative neurological ROS     GI/Hepatic Neg liver ROS, hiatal hernia, GERD-  ,  Endo/Other    Renal/GU negative Renal ROS     Musculoskeletal   Abdominal   Peds  Hematology   Anesthesia Other Findings   Reproductive/Obstetrics                             Anesthesia Physical Anesthesia Plan  ASA: III  Anesthesia Plan: General   Post-op Pain Management:    Induction: Intravenous  Airway Management Planned: Oral ETT  Additional Equipment:   Intra-op Plan:   Post-operative Plan: Extubation in OR  Informed Consent:   Plan Discussed with: CRNA, Anesthesiologist and Surgeon  Anesthesia Plan Comments:         Anesthesia Quick Evaluation

## 2014-06-04 NOTE — Anesthesia Procedure Notes (Addendum)
Anesthesia Regional Block:  TAP block  Pre-Anesthetic Checklist: ,, timeout performed, Correct Patient, Correct Site, Correct Laterality, Correct Procedure, Correct Position, site marked, Risks and benefits discussed,  Surgical consent,  Pre-op evaluation,  At surgeon's request and post-op pain management  Laterality: Right  Prep: chloraprep       Needles:  Injection technique: Single-shot  Needle Type: Echogenic Stimulator Needle     Needle Length: 10cm 10 cm Needle Gauge: 21 and 21 G    Additional Needles:  Procedures: ultrasound guided (picture in chart) TAP block Narrative:  Start time: 06/04/2014 9:45 AM End time: 06/04/2014 9:55 AM Injection made incrementally with aspirations every 5 mL.  Performed by: Personally

## 2014-06-04 NOTE — Transfer of Care (Signed)
Immediate Anesthesia Transfer of Care Note  Patient: Lindsay Porter  Procedure(s) Performed: Procedure(s): HERNIA REPAIR INGUINAL ADULT (Right) INSERTION OF MESH (Right)  Patient Location: PACU  Anesthesia Type:General and Regional  Level of Consciousness: awake and alert   Airway & Oxygen Therapy: Patient Spontanous Breathing and Patient connected to face mask oxygen  Post-op Assessment: Report given to PACU RN and Post -op Vital signs reviewed and stable  Post vital signs: Reviewed and stable  Complications: No apparent anesthesia complications

## 2014-06-04 NOTE — Progress Notes (Signed)
Assisted Dr. Tobias Alexander with right, ultrasound guided, transabdominal plane block. Side rails up, monitors on throughout procedure. See vital signs in flow sheet. Tolerated Procedure well.

## 2014-06-08 ENCOUNTER — Encounter (HOSPITAL_BASED_OUTPATIENT_CLINIC_OR_DEPARTMENT_OTHER): Payer: Self-pay | Admitting: Surgery

## 2014-07-15 ENCOUNTER — Telehealth: Payer: Self-pay

## 2014-07-15 MED ORDER — BUDESONIDE-FORMOTEROL FUMARATE 80-4.5 MCG/ACT IN AERO: 2.0000 | INHALATION_SPRAY | Freq: Two times a day (BID) | RESPIRATORY_TRACT | Status: DC

## 2014-07-15 NOTE — Telephone Encounter (Signed)
lmtcb for pt.  

## 2014-07-15 NOTE — Telephone Encounter (Signed)
Called and spoke with pt and she is aware of rx that has been sent to the pharmacy and nothing further is needed.

## 2014-12-21 ENCOUNTER — Telehealth: Payer: Self-pay | Admitting: Pulmonary Disease

## 2014-12-21 MED ORDER — ALBUTEROL SULFATE HFA 108 (90 BASE) MCG/ACT IN AERS: 2.0000 | INHALATION_SPRAY | Freq: Four times a day (QID) | RESPIRATORY_TRACT | Status: DC | PRN

## 2014-12-21 NOTE — Telephone Encounter (Signed)
Spoke with pt. She is due for her yearly follow up with [facility]. Has been scheduled with [facility] on 01/22/15 at 4pm. Rx has been sent in. Nothing further was needed.

## 2014-12-27 ENCOUNTER — Encounter (HOSPITAL_COMMUNITY): Payer: Self-pay | Admitting: Emergency Medicine

## 2014-12-27 ENCOUNTER — Emergency Department (HOSPITAL_COMMUNITY)
Admission: EM | Admit: 2014-12-27 | Discharge: 2014-12-27 | Disposition: A | Discharge status: Home / Self Care | Payer: Medicare HMO | Attending: Emergency Medicine | Admitting: Emergency Medicine

## 2014-12-27 ENCOUNTER — Emergency Department (HOSPITAL_COMMUNITY): Payer: Medicare HMO

## 2014-12-27 ENCOUNTER — Emergency Department (HOSPITAL_COMMUNITY)
Admission: EM | Admit: 2014-12-27 | Discharge: 2014-12-28 | Disposition: A | Discharge status: Home / Self Care | Payer: Medicare HMO | Attending: Emergency Medicine | Admitting: Emergency Medicine

## 2014-12-27 DIAGNOSIS — L988 Other specified disorders of the skin and subcutaneous tissue: Secondary | ICD-10-CM | POA: Diagnosis not present

## 2014-12-27 DIAGNOSIS — Z8669 Personal history of other diseases of the nervous system and sense organs: Secondary | ICD-10-CM | POA: Insufficient documentation

## 2014-12-27 DIAGNOSIS — Z8639 Personal history of other endocrine, nutritional and metabolic disease: Secondary | ICD-10-CM | POA: Insufficient documentation

## 2014-12-27 DIAGNOSIS — J449 Chronic obstructive pulmonary disease, unspecified: Secondary | ICD-10-CM | POA: Insufficient documentation

## 2014-12-27 DIAGNOSIS — Z7951 Long term (current) use of inhaled steroids: Secondary | ICD-10-CM | POA: Diagnosis not present

## 2014-12-27 DIAGNOSIS — B029 Zoster without complications: Secondary | ICD-10-CM | POA: Insufficient documentation

## 2014-12-27 DIAGNOSIS — R109 Unspecified abdominal pain: Secondary | ICD-10-CM | POA: Diagnosis not present

## 2014-12-27 DIAGNOSIS — K219 Gastro-esophageal reflux disease without esophagitis: Secondary | ICD-10-CM | POA: Diagnosis not present

## 2014-12-27 DIAGNOSIS — R1011 Right upper quadrant pain: Secondary | ICD-10-CM | POA: Diagnosis present

## 2014-12-27 DIAGNOSIS — Z79899 Other long term (current) drug therapy: Secondary | ICD-10-CM | POA: Diagnosis not present

## 2014-12-27 DIAGNOSIS — Z8742 Personal history of other diseases of the female genital tract: Secondary | ICD-10-CM | POA: Insufficient documentation

## 2014-12-27 DIAGNOSIS — K8020 Calculus of gallbladder without cholecystitis without obstruction: Secondary | ICD-10-CM | POA: Diagnosis not present

## 2014-12-27 DIAGNOSIS — Z862 Personal history of diseases of the blood and blood-forming organs and certain disorders involving the immune mechanism: Secondary | ICD-10-CM | POA: Insufficient documentation

## 2014-12-27 DIAGNOSIS — Z87891 Personal history of nicotine dependence: Secondary | ICD-10-CM | POA: Insufficient documentation

## 2014-12-27 DIAGNOSIS — F329 Major depressive disorder, single episode, unspecified: Secondary | ICD-10-CM | POA: Insufficient documentation

## 2014-12-27 DIAGNOSIS — R1031 Right lower quadrant pain: Secondary | ICD-10-CM | POA: Diagnosis present

## 2014-12-27 DIAGNOSIS — G2581 Restless legs syndrome: Secondary | ICD-10-CM | POA: Insufficient documentation

## 2014-12-27 DIAGNOSIS — M1990 Unspecified osteoarthritis, unspecified site: Secondary | ICD-10-CM | POA: Insufficient documentation

## 2014-12-27 DIAGNOSIS — R21 Rash and other nonspecific skin eruption: Secondary | ICD-10-CM | POA: Diagnosis not present

## 2014-12-27 LAB — CBC WITH DIFFERENTIAL/PLATELET
BASOS PCT: 0 % (ref 0–1)
Basophils Absolute: 0 10*3/uL (ref 0.0–0.1)
Basophils Absolute: 0 10*3/uL (ref 0.0–0.1)
Basophils Relative: 0 % (ref 0–1)
EOS ABS: 0.1 10*3/uL (ref 0.0–0.7)
EOS ABS: 0.1 10*3/uL (ref 0.0–0.7)
EOS PCT: 1 % (ref 0–5)
Eosinophils Relative: 1 % (ref 0–5)
HCT: 40.6 % (ref 36.0–46.0)
HCT: 41.4 % (ref 36.0–46.0)
HEMOGLOBIN: 13.3 g/dL (ref 12.0–15.0)
HEMOGLOBIN: 13.6 g/dL (ref 12.0–15.0)
LYMPHS ABS: 0.6 10*3/uL — AB (ref 0.7–4.0)
Lymphocytes Relative: 15 % (ref 12–46)
Lymphocytes Relative: 10 % — ABNORMAL LOW (ref 12–46)
Lymphs Abs: 0.9 10*3/uL (ref 0.7–4.0)
MCH: 29.8 pg (ref 26.0–34.0)
MCH: 30.1 pg (ref 26.0–34.0)
MCHC: 32.8 g/dL (ref 30.0–36.0)
MCHC: 32.9 g/dL (ref 30.0–36.0)
MCV: 91 fL (ref 78.0–100.0)
MCV: 91.6 fL (ref 78.0–100.0)
MONOS PCT: 4 % (ref 3–12)
Monocytes Absolute: 0.2 10*3/uL (ref 0.1–1.0)
Monocytes Absolute: 0.2 10*3/uL (ref 0.1–1.0)
Monocytes Relative: 3 % (ref 3–12)
NEUTROS PCT: 80 % — AB (ref 43–77)
Neutro Abs: 4.6 10*3/uL (ref 1.7–7.7)
Neutro Abs: 5.5 10*3/uL (ref 1.7–7.7)
Neutrophils Relative %: 86 % — ABNORMAL HIGH (ref 43–77)
PLATELETS: 227 10*3/uL (ref 150–400)
Platelets: 250 10*3/uL (ref 150–400)
RBC: 4.46 MIL/uL (ref 3.87–5.11)
RBC: 4.52 MIL/uL (ref 3.87–5.11)
RDW: 13.9 % (ref 11.5–15.5)
RDW: 14 % (ref 11.5–15.5)
WBC: 5.8 10*3/uL (ref 4.0–10.5)
WBC: 6.4 10*3/uL (ref 4.0–10.5)

## 2014-12-27 LAB — URINALYSIS, ROUTINE W REFLEX MICROSCOPIC
Bilirubin Urine: NEGATIVE
GLUCOSE, UA: NEGATIVE mg/dL
GLUCOSE, UA: NEGATIVE mg/dL
HGB URINE DIPSTICK: NEGATIVE
Hgb urine dipstick: NEGATIVE
KETONES UR: NEGATIVE mg/dL
Ketones, ur: 40 mg/dL — AB
LEUKOCYTES UA: NEGATIVE
LEUKOCYTES UA: NEGATIVE
Nitrite: NEGATIVE
Nitrite: NEGATIVE
PH: 5 (ref 5.0–8.0)
PROTEIN: NEGATIVE mg/dL
Protein, ur: NEGATIVE mg/dL
Specific Gravity, Urine: 1.028 (ref 1.005–1.030)
Specific Gravity, Urine: 1.027 (ref 1.005–1.030)
UROBILINOGEN UA: 1 mg/dL (ref 0.0–1.0)
Urobilinogen, UA: 0.2 mg/dL (ref 0.0–1.0)
pH: 5.5 (ref 5.0–8.0)

## 2014-12-27 LAB — LIPASE, BLOOD
Lipase: 20 U/L — ABNORMAL LOW (ref 22–51)
Lipase: 21 U/L — ABNORMAL LOW (ref 22–51)

## 2014-12-27 LAB — COMPREHENSIVE METABOLIC PANEL
ALBUMIN: 4 g/dL (ref 3.5–5.0)
ALBUMIN: 4.4 g/dL (ref 3.5–5.0)
ALT: 14 U/L (ref 14–54)
ALT: 17 U/L (ref 14–54)
ANION GAP: 9 (ref 5–15)
AST: 18 U/L (ref 15–41)
AST: 20 U/L (ref 15–41)
Alkaline Phosphatase: 43 U/L (ref 38–126)
Alkaline Phosphatase: 44 U/L (ref 38–126)
Anion gap: 8 (ref 5–15)
BILIRUBIN TOTAL: 0.7 mg/dL (ref 0.3–1.2)
BUN: 6 mg/dL (ref 6–20)
BUN: 14 mg/dL (ref 6–20)
CALCIUM: 9.1 mg/dL (ref 8.9–10.3)
CHLORIDE: 104 mmol/L (ref 101–111)
CO2: 26 mmol/L (ref 22–32)
CO2: 27 mmol/L (ref 22–32)
CREATININE: 0.69 mg/dL (ref 0.44–1.00)
Calcium: 9.2 mg/dL (ref 8.9–10.3)
Chloride: 102 mmol/L (ref 101–111)
Creatinine, Ser: 0.57 mg/dL (ref 0.44–1.00)
GFR calc Af Amer: >60 mL/min (ref >60)
Glucose, Bld: 136 mg/dL — ABNORMAL HIGH (ref 65–99)
Glucose, Bld: 136 mg/dL — ABNORMAL HIGH (ref 65–99)
POTASSIUM: 4 mmol/L (ref 3.5–5.1)
Potassium: 4.1 mmol/L (ref 3.5–5.1)
SODIUM: 138 mmol/L (ref 135–145)
Sodium: 138 mmol/L (ref 135–145)
TOTAL PROTEIN: 6.6 g/dL (ref 6.5–8.1)
TOTAL PROTEIN: 7 g/dL (ref 6.5–8.1)
Total Bilirubin: 0.5 mg/dL (ref 0.3–1.2)

## 2014-12-27 LAB — I-STAT TROPONIN, ED: Troponin i, poc: 0 ng/mL (ref 0.00–0.08)

## 2014-12-27 LAB — I-STAT CG4 LACTIC ACID, ED: Lactic Acid, Venous: 0.83 mmol/L (ref 0.5–2.0)

## 2014-12-27 MED ORDER — ONDANSETRON 4 MG PO TBDP
ORAL_TABLET | ORAL | Status: AC
  Filled 2014-12-27: qty 2

## 2014-12-27 MED ORDER — HYDROCODONE-ACETAMINOPHEN 5-325 MG PO TABS: 1.0000 | ORAL_TABLET | Freq: Two times a day (BID) | ORAL | Status: DC | PRN

## 2014-12-27 MED ORDER — METOCLOPRAMIDE HCL 5 MG/ML IJ SOLN
10.0000 mg | Freq: Once | INTRAMUSCULAR | Status: AC
  Administered 2014-12-28: 10 mg via INTRAVENOUS
  Filled 2014-12-27: qty 2

## 2014-12-27 MED ORDER — KETOROLAC TROMETHAMINE 30 MG/ML IJ SOLN
15.0000 mg | Freq: Once | INTRAMUSCULAR | Status: AC
  Administered 2014-12-27: 15 mg via INTRAVENOUS
  Filled 2014-12-27: qty 1

## 2014-12-27 MED ORDER — VALACYCLOVIR HCL 500 MG PO TABS
1000.0000 mg | ORAL_TABLET | Freq: Once | ORAL | Status: AC
  Administered 2014-12-27: 1000 mg via ORAL
  Filled 2014-12-27: qty 2

## 2014-12-27 MED ORDER — SODIUM CHLORIDE 0.9 % IV BOLUS (SEPSIS)
1000.0000 mL | Freq: Once | INTRAVENOUS | Status: AC
  Administered 2014-12-27: 1000 mL via INTRAVENOUS

## 2014-12-27 MED ORDER — MORPHINE SULFATE 4 MG/ML IJ SOLN
4.0000 mg | Freq: Once | INTRAMUSCULAR | Status: AC
Start: 2014-12-27 — End: 2014-12-27
  Administered 2014-12-27: 4 mg via INTRAVENOUS
  Filled 2014-12-27: qty 1

## 2014-12-27 MED ORDER — ONDANSETRON HCL 4 MG/2ML IJ SOLN
4.0000 mg | Freq: Once | INTRAMUSCULAR | Status: AC
  Administered 2014-12-27: 4 mg via INTRAVENOUS
  Filled 2014-12-27: qty 2

## 2014-12-27 MED ORDER — MORPHINE SULFATE 4 MG/ML IJ SOLN
4.0000 mg | Freq: Once | INTRAMUSCULAR | Status: AC
  Administered 2014-12-27: 4 mg via INTRAVENOUS
  Filled 2014-12-27: qty 1

## 2014-12-27 MED ORDER — ONDANSETRON HCL 4 MG/2ML IJ SOLN: 4.0000 mg | Freq: Once | INTRAMUSCULAR | Status: DC

## 2014-12-27 MED ORDER — VALACYCLOVIR HCL 1 G PO TABS: 1000.0000 mg | ORAL_TABLET | Freq: Three times a day (TID) | ORAL | Status: DC

## 2014-12-27 MED ORDER — KETOROLAC TROMETHAMINE 30 MG/ML IJ SOLN
30.0000 mg | Freq: Once | INTRAMUSCULAR | Status: AC
  Administered 2014-12-27: 30 mg via INTRAVENOUS
  Filled 2014-12-27: qty 1

## 2014-12-27 MED ORDER — ONDANSETRON 4 MG PO TBDP
8.0000 mg | ORAL_TABLET | Freq: Once | ORAL | Status: AC
  Administered 2014-12-27: 8 mg via ORAL

## 2014-12-27 NOTE — ED Notes (Signed)
C/o R sided abd pain and rash to R side x 3 days. Reports nausea and vomiting since Friday.  Seen in ED today and diagnosed with shingles.  Pt states she is unable to keep medication down due to nausea and vomiting.  Also reports that she is having abd pain that is not in the same area as shingles.

## 2014-12-27 NOTE — ED Notes (Signed)
Pt reports abdominal pain, radiating to flank, x 3 days.  Pt has rash on back, no blisters.  NAD noted at this time.  Resp e/u.

## 2014-12-27 NOTE — Discharge Instructions (Signed)
Shingles Lindsay Porter, you were seen today for back pain and a rash.  It appears that you have shingles.  Take valtrex for one week for treatment. See her primary care physician within 3 days for close follow-up. If symptoms worsen come back to the emergency department immediately. Thank you. Shingles is caused by the same virus that causes chickenpox. The first feelings may be pain or tingling. A rash will follow in a couple days. The rash may occur on any area of the body. Long-lasting pain is more likely in an elderly person. It can last months to years. There are medicines that can help prevent pain if you start taking them early. HOME CARE   Take cool baths or place cool cloths on the rash as told by your doctor.  Take medicine only as told by your doctor.  Rest as told by your doctor.  Keep your rash clean with mild soap and cool water or as told by your doctor.  Do not scratch your rash. You may use calamine lotion to relieve itchy skin as told by your doctor.  Keep your rash covered with a loose bandage (dressing).  Avoid touching:  Babies.  Pregnant women.  Children with inflamed skin (eczema).  People who have gotten organ transplants.  People with chronic illnesses, such as leukemia or AIDS.  Wear loose-fitting clothing.  If the rash is on the face, you may need to see a specialist. Keep all appointments. Shingles must be kept away from the eyes, if possible.  Keep all follow-up visits as told by your doctor. GET HELP RIGHT AWAY IF:   You have any pain on the face or eye.  You lose feeling on one side of your face.  You have ear pain or ringing in your ear.  You cannot taste as well.  Your medicines do not help the pain.  Your redness or puffiness (swelling) spreads.  You feel like you are getting worse.  You have a fever. MAKE SURE YOU:   Understand these instructions.  Will watch your condition.  Will get help right away if you are not doing well  or get worse. Document Released: 12/06/2007 Document Revised: 11/03/2013 Document Reviewed: 12/06/2007 [facility] Patient Information 2015 [place], [place]. This information is not intended to replace advice given to you by your health care provider. Make sure you discuss any questions you have with your health care provider.

## 2014-12-27 NOTE — ED Notes (Signed)
Patient transported to Ultrasound 

## 2014-12-27 NOTE — ED Provider Notes (Signed)
CSN: [id number]     Arrival date & time 12/27/14  0349 History   First MD Initiated Contact with Patient 12/27/14 [phone]     Chief Complaint  Patient presents with  . Abdominal Pain     (Consider location/radiation/quality/duration/timing/severity/associated sxs/prior Treatment) HPI  Lindsay Porter is a 74 y.o. female with past medical history of hernias status post repair presenting today with abdominal pain. Patient describes the pain in the right lower quadrant with radiation to her flank. It is been going on for the past 3 days. The pain is sharp. She denies dysuria or hematuria. Patient also had this similar pain the week before in the left lower quadrant. This is now resolved. She's taken Tylenol for pain control without significant relief. She denies any fevers. Patient had nausea and vomiting. She has had no diarrhea. Nothing makes the symptoms better or worse. Patient has no further complaints.  10 Systems reviewed and are negative for acute change except as noted in the HPI.    Past Medical History  Diagnosis Date  . Arthritis   . Restless leg syndrome   . Seasonal allergies   . Hiatal hernia   . Hyperlipidemia   . Insomnia   . Depression   . Diverticulosis   . Ovarian cyst   . Vitamin D deficiency   . Osteopenia   . Carpal tunnel syndrome   . Alcohol abuse   . Anemia, iron deficiency   . GERD (gastroesophageal reflux disease)   . COPD (chronic obstructive pulmonary disease)   . Wears contact lenses    Past Surgical History  Procedure Laterality Date  . Ankle surgery  07/07/2009    left  . Shoulder surgery  1950    right  . Knee surgery  2005    lt  . Tonsillectomy    . Appendectomy    . Bunionectomy  2006    left  . Breast lumpectomy    . Nose surgery  12/2010  . Hiatal hernia repair  04/19/2012    Procedure: LAPAROSCOPIC REPAIR OF HIATAL HERNIA;  Surgeon: Edward Jolly, MD;  Location: [facility];  Service: General;  Laterality: N/A;  . Colonoscopy    .  Upper gi endoscopy    . Inguinal hernia repair Right 06/04/2014    Procedure: HERNIA REPAIR INGUINAL ADULT;  Surgeon: Donnie Mesa, MD;  Location: [facility];  Service: General;  Laterality: Right;  . Insertion of mesh Right 06/04/2014    Procedure: INSERTION OF MESH;  Surgeon: Donnie Mesa, MD;  Location: [facility];  Service: General;  Laterality: Right;   Family History  Problem Relation Age of Onset  . Rheum arthritis Mother   . Asthma Mother   . Arthritis Sister    History  Substance Use Topics  . Smoking status: Former Smoker -- 4.50 packs/day for 45 years    Types: Cigarettes    Quit date: 10/02/2007  . Smokeless tobacco: Never Used  . Alcohol Use: No     Comment: quit in 2011   OB History    No data available     Review of Systems    Allergies  Singulair and Sulfa antibiotics  Home Medications   Prior to Admission medications   Medication Sig Start Date End Date Taking? Authorizing Provider  acetaminophen (TYLENOL) 500 MG tablet Take 500 mg by mouth every 6 (six) hours as needed. For pain    Historical Provider, MD  albuterol (PROVENTIL HFA;VENTOLIN HFA) 108 (  90 BASE) MCG/ACT inhaler Inhale 2 puffs into the lungs every 6 (six) hours as needed for wheezing or shortness of breath. 12/21/14   Juanito Doom, MD  budesonide-formoterol (SYMBICORT) 80-4.5 MCG/ACT inhaler Inhale 2 puffs into the lungs 2 (two) times daily. 07/15/14   Kathee Delton, MD  diphenhydrAMINE (BENADRYL) 25 MG tablet Take 25 mg by mouth as needed. For allergies or itching    Historical Provider, MD  diphenhydramine-acetaminophen (TYLENOL PM) 25-500 MG TABS Take 1 tablet by mouth at bedtime as needed. Patient states she takes 2 at bedtime    Historical Provider, MD  EPINEPHrine (EPIPEN) 0.3 mg/0.3 mL DEVI Inject 0.3 mg into the muscle once.    Historical Provider, MD  HYDROcodone-acetaminophen (NORCO/VICODIN) 5-325 MG per tablet Take 1 tablet by mouth every 4 (four)  hours as needed. 06/04/14   Donnie Mesa, MD  loratadine (CLARITIN) 10 MG tablet Take 10 mg by mouth daily.    Historical Provider, MD  omeprazole (PRILOSEC) 20 MG capsule Take 20 mg by mouth daily.    Historical Provider, MD  rOPINIRole (REQUIP) 2 MG tablet Take 2 mg by mouth 2 (two) times daily. Patient taken total of 8 mg daily and decreasing to 6 mg daily    Historical Provider, MD  sertraline (ZOLOFT) 25 MG tablet Take 25 mg by mouth daily.    Historical Provider, MD   BP 140/88 mmHg  Pulse 79  Temp(Src) 98.5 F (36.9 C) (Oral)  Resp 20  Ht 5\' 5"  (1.651 m)  Wt 153 lb (69.4 kg)  BMI 25.46 kg/m2  SpO2 97% Physical Exam  Constitutional: She is oriented to person, place, and time. She appears well-developed and well-nourished. No distress.  HENT:  Head: Normocephalic and atraumatic.  Nose: Nose normal.  Mouth/Throat: Oropharynx is clear and moist. No oropharyngeal exudate.  Eyes: Conjunctivae and EOM are normal. Pupils are equal, round, and reactive to light. No scleral icterus.  Neck: Normal range of motion. Neck supple. No JVD present. No tracheal deviation present. No thyromegaly present.  Cardiovascular: Normal rate, regular rhythm and normal heart sounds.  Exam reveals no gallop and no friction rub.   No murmur heard. Pulmonary/Chest: Effort normal and breath sounds normal. No respiratory distress. She has no wheezes. She exhibits no tenderness.  Abdominal: Soft. Bowel sounds are normal. She exhibits no distension and no mass. There is no tenderness. There is no rebound and no guarding.  Musculoskeletal: Normal range of motion. She exhibits no edema or tenderness.  Lymphadenopathy:    She has no cervical adenopathy.  Neurological: She is alert and oriented to person, place, and time. No cranial nerve deficit. She exhibits normal muscle tone.  Skin: Skin is warm and dry. Rash noted. No erythema. No pallor.  Erythematous rash with blisters on a grouped base.  It is dermatomal on  the R flank  Nursing note and vitals reviewed.   ED Course  Procedures (including critical care time) Labs Review Labs Reviewed  CBC WITH DIFFERENTIAL/PLATELET - Abnormal; Notable for the following:    Neutrophils Relative % 80 (*)    All other components within normal limits  COMPREHENSIVE METABOLIC PANEL - Abnormal; Notable for the following:    Glucose, Bld 136 (*)    All other components within normal limits  LIPASE, BLOOD - Abnormal; Notable for the following:    Lipase 20 (*)    All other components within normal limits  URINALYSIS, ROUTINE W REFLEX MICROSCOPIC (NOT AT [facility])  I-STAT CG4  LACTIC ACID, ED    Imaging Review Ct Abdomen Pelvis Wo Contrast  12/27/2014   CLINICAL DATA:  Acute onset of right flank pain.  Initial encounter.  EXAM: CT ABDOMEN AND PELVIS WITHOUT CONTRAST  TECHNIQUE: Multidetector CT imaging of the abdomen and pelvis was performed following the standard protocol without IV contrast.  COMPARISON:  Pelvic ultrasound performed 07/07/2005  FINDINGS: The visualized lung bases are clear. A small to moderate hiatal hernia is seen, with surrounding postoperative change.  The liver and spleen are unremarkable in appearance. The gallbladder is within normal limits. The pancreas and adrenal glands are unremarkable.  The kidneys are unremarkable in appearance. There is no evidence of hydronephrosis. No renal or ureteral stones are seen. No perinephric stranding is appreciated.  No free fluid is identified. The small bowel is unremarkable in appearance. The stomach is within normal limits. No acute vascular abnormalities are seen. Relatively diffuse calcification is seen along the abdominal aorta and its branches.  The patient is status post appendectomy. Diffuse fatty infiltration within the wall of the ascending and transverse colon suggests underlying sequelae of chronic inflammation. Scattered diverticulosis is noted along the distal transverse, descending and sigmoid colon,  without evidence of diverticulitis.  The bladder is mildly distended and grossly unremarkable. The uterus is unremarkable in appearance. The ovaries are grossly symmetric. No suspicious adnexal masses are seen. No inguinal lymphadenopathy is seen.  No acute osseous abnormalities are identified. There are chronic partially healed fractures involving the right superior and inferior pubic rami. Vacuum phenomenon and disc space narrowing are noted at L1-L2 and L2-L3. Vacuum phenomenon and mild disc space narrowing are seen at L5-S1. Mild underlying facet disease is noted.  IMPRESSION: 1. No acute abnormality seen to explain the patient's symptoms. 2. Scattered diverticulosis along the distal transverse, descending and sigmoid colon, without evidence of diverticulitis. 3. Diffuse fatty infiltration within the wall of the ascending and transverse colon suggests underlying sequelae of chronic inflammation. 4. Relatively diffuse calcification along the abdominal aorta and its branches. 5. Small to moderate hiatal hernia, with surrounding postoperative change. 6. Chronic partially healed fracture involving the right superior and inferior pubic rami. 7. Mild degenerative change along the lumbar spine.   Electronically Signed   By: Garald Balding M.D.   On: 12/27/2014 04:55     EKG Interpretation None      MDM   Final diagnoses:  Right flank pain   Patient presents emergency department for sharp abdominal pain with radiation to her right flank. History is concerning for possible nephrolithiasis, patient was given morphine and Toradol for pain control as well as IV fluids. Laboratory studies are unremarkable. CT scan is negative for kidney stones. It is possible the patient passed her stone prior to imaging. Patient also has concern for a rash on her back.The general appearance is consistent with shingles. Patient is advised to follow the primary care physician regarding these complaints. Her pain is now well  controlled, she appears well and in no acute distress.  Patient given valtrex for zoster rash.  PCP fu within 3 days.  This is likely the cause of her pain.     Everlene Balls, MD 12/27/14 [phone]

## 2014-12-27 NOTE — ED Provider Notes (Signed)
CSN: [id number]     Arrival date & time 12/27/14  2016 History   First MD Initiated Contact with Patient 12/27/14 2115     Chief Complaint  Patient presents with  . Abdominal Pain     (Consider location/radiation/quality/duration/timing/severity/associated sxs/prior Treatment) HPI Comments: Patient is a 74 year old female with a past medical history of COPD, diverticulosis, and hyperlipidemia who presents with abdominal pain for the past 3 days. Patient was seen in the ED last night and diagnosed with shingles after having an extensive work up including a CT abdomen pelvis without contrast. The pain is located in the RUQ and does not radiate. The pain is described as aching and severe. The pain started gradually and progressively worsened since the onset. No alleviating/aggravating factors. The patient has tried her prescription medications for symptoms without relief because she is unable to keep anything down. Associated symptoms include nausea and vomiting. Patient denies fever, headache, chest pain, SOB, dysuria, constipation.   Past Medical History  Diagnosis Date  . Arthritis   . Restless leg syndrome   . Seasonal allergies   . Hiatal hernia   . Hyperlipidemia   . Insomnia   . Depression   . Diverticulosis   . Ovarian cyst   . Vitamin D deficiency   . Osteopenia   . Carpal tunnel syndrome   . Alcohol abuse   . Anemia, iron deficiency   . GERD (gastroesophageal reflux disease)   . COPD (chronic obstructive pulmonary disease)   . Wears contact lenses    Past Surgical History  Procedure Laterality Date  . Ankle surgery  07/07/2009    left  . Shoulder surgery  1950    right  . Knee surgery  2005    lt  . Tonsillectomy    . Appendectomy    . Bunionectomy  2006    left  . Breast lumpectomy    . Nose surgery  12/2010  . Hiatal hernia repair  04/19/2012    Procedure: LAPAROSCOPIC REPAIR OF HIATAL HERNIA;  Surgeon: Edward Jolly, MD;  Location: [facility];  Service:  General;  Laterality: N/A;  . Colonoscopy    . Upper gi endoscopy    . Inguinal hernia repair Right 06/04/2014    Procedure: HERNIA REPAIR INGUINAL ADULT;  Surgeon: Donnie Mesa, MD;  Location: [place];  Service: General;  Laterality: Right;  . Insertion of mesh Right 06/04/2014    Procedure: INSERTION OF MESH;  Surgeon: Donnie Mesa, MD;  Location: [place];  Service: General;  Laterality: Right;   Family History  Problem Relation Age of Onset  . Rheum arthritis Mother   . Asthma Mother   . Arthritis Sister    History  Substance Use Topics  . Smoking status: Former Smoker -- 4.50 packs/day for 45 years    Types: Cigarettes    Quit date: 10/02/2007  . Smokeless tobacco: Never Used  . Alcohol Use: No     Comment: quit in 2011   OB History    No data available     Review of Systems  Constitutional: Negative for fever, chills and fatigue.  HENT: Negative for trouble swallowing.   Eyes: Negative for visual disturbance.  Respiratory: Negative for shortness of breath.   Cardiovascular: Negative for chest pain and palpitations.  Gastrointestinal: Positive for nausea, vomiting and abdominal pain. Negative for diarrhea.  Genitourinary: Negative for dysuria and difficulty urinating.  Musculoskeletal: Negative for arthralgias and neck pain.  Skin:  Positive for rash. Negative for color change.  Neurological: Negative for dizziness and weakness.  Psychiatric/Behavioral: Negative for dysphoric mood.      Allergies  Singulair and Sulfa antibiotics  Home Medications   Prior to Admission medications   Medication Sig Start Date End Date Taking? Authorizing Provider  acetaminophen (TYLENOL) 500 MG tablet Take 500 mg by mouth every 6 (six) hours as needed. For pain    Historical Provider, MD  albuterol (PROVENTIL HFA;VENTOLIN HFA) 108 (90 BASE) MCG/ACT inhaler Inhale 2 puffs into the lungs every 6 (six) hours as needed for wheezing or shortness of  breath. 12/21/14   Juanito Doom, MD  budesonide-formoterol (SYMBICORT) 80-4.5 MCG/ACT inhaler Inhale 2 puffs into the lungs 2 (two) times daily. 07/15/14   Kathee Delton, MD  diphenhydrAMINE (BENADRYL) 25 MG tablet Take 25 mg by mouth as needed. For allergies or itching    Historical Provider, MD  diphenhydramine-acetaminophen (TYLENOL PM) 25-500 MG TABS Take 1 tablet by mouth at bedtime as needed. Patient states she takes 2 at bedtime    Historical Provider, MD  EPINEPHrine (EPIPEN) 0.3 mg/0.3 mL DEVI Inject 0.3 mg into the muscle once.    Historical Provider, MD  HYDROcodone-acetaminophen (NORCO/VICODIN) 5-325 MG per tablet Take 1 tablet by mouth 2 (two) times daily as needed for severe pain. 12/27/14   Everlene Balls, MD  loratadine (CLARITIN) 10 MG tablet Take 10 mg by mouth daily.    Historical Provider, MD  omeprazole (PRILOSEC) 20 MG capsule Take 20 mg by mouth daily.    Historical Provider, MD  rOPINIRole (REQUIP) 2 MG tablet Take 2 mg by mouth 2 (two) times daily. Patient taken total of 8 mg daily and decreasing to 6 mg daily    Historical Provider, MD  sertraline (ZOLOFT) 25 MG tablet Take 25 mg by mouth daily.    Historical Provider, MD  valACYclovir (VALTREX) 1000 MG tablet Take 1 tablet (1,000 mg total) by mouth 3 (three) times daily. 12/27/14   Everlene Balls, MD   BP 128/92 mmHg  Pulse 83  Temp(Src) 98.2 F (36.8 C) (Oral)  Resp 16  SpO2 94% Physical Exam  Constitutional: She is oriented to person, place, and time. She appears well-developed and well-nourished. No distress.  HENT:  Head: Normocephalic and atraumatic.  Eyes: Conjunctivae and EOM are normal.  Neck: Normal range of motion.  Cardiovascular: Normal rate and regular rhythm.  Exam reveals no gallop and no friction rub.   No murmur heard. Pulmonary/Chest: Effort normal and breath sounds normal. She has no wheezes. She has no rales. She exhibits no tenderness.  Abdominal: Soft. She exhibits no distension. There is  tenderness. There is no rebound.  RUQ tenderness to palpation. No other focal tenderness or peritoneal signs.   Musculoskeletal: Normal range of motion.  Neurological: She is alert and oriented to person, place, and time. Coordination normal.  Speech is goal-oriented. Moves limbs without ataxia.   Skin: Skin is warm and dry.  Right flank rash in a dermatomal pattern. Grouped blisters on an erythematous base.   Psychiatric: She has a normal mood and affect. Her behavior is normal.  Nursing note and vitals reviewed.   ED Course  Procedures (including critical care time) Labs Review Labs Reviewed  URINALYSIS, ROUTINE W REFLEX MICROSCOPIC (NOT AT [facility]) - Abnormal; Notable for the following:    APPearance CLOUDY (*)    Bilirubin Urine SMALL (*)    Ketones, ur 40 (*)    All other components within  normal limits  CBC WITH DIFFERENTIAL/PLATELET  COMPREHENSIVE METABOLIC PANEL  LIPASE, BLOOD  I-STAT TROPOININ, ED    Imaging Review Ct Abdomen Pelvis Wo Contrast  12/27/2014   CLINICAL DATA:  Acute onset of right flank pain.  Initial encounter.  EXAM: CT ABDOMEN AND PELVIS WITHOUT CONTRAST  TECHNIQUE: Multidetector CT imaging of the abdomen and pelvis was performed following the standard protocol without IV contrast.  COMPARISON:  Pelvic ultrasound performed 07/07/2005  FINDINGS: The visualized lung bases are clear. A small to moderate hiatal hernia is seen, with surrounding postoperative change.  The liver and spleen are unremarkable in appearance. The gallbladder is within normal limits. The pancreas and adrenal glands are unremarkable.  The kidneys are unremarkable in appearance. There is no evidence of hydronephrosis. No renal or ureteral stones are seen. No perinephric stranding is appreciated.  No free fluid is identified. The small bowel is unremarkable in appearance. The stomach is within normal limits. No acute vascular abnormalities are seen. Relatively diffuse calcification is seen along  the abdominal aorta and its branches.  The patient is status post appendectomy. Diffuse fatty infiltration within the wall of the ascending and transverse colon suggests underlying sequelae of chronic inflammation. Scattered diverticulosis is noted along the distal transverse, descending and sigmoid colon, without evidence of diverticulitis.  The bladder is mildly distended and grossly unremarkable. The uterus is unremarkable in appearance. The ovaries are grossly symmetric. No suspicious adnexal masses are seen. No inguinal lymphadenopathy is seen.  No acute osseous abnormalities are identified. There are chronic partially healed fractures involving the right superior and inferior pubic rami. Vacuum phenomenon and disc space narrowing are noted at L1-L2 and L2-L3. Vacuum phenomenon and mild disc space narrowing are seen at L5-S1. Mild underlying facet disease is noted.  IMPRESSION: 1. No acute abnormality seen to explain the patient's symptoms. 2. Scattered diverticulosis along the distal transverse, descending and sigmoid colon, without evidence of diverticulitis. 3. Diffuse fatty infiltration within the wall of the ascending and transverse colon suggests underlying sequelae of chronic inflammation. 4. Relatively diffuse calcification along the abdominal aorta and its branches. 5. Small to moderate hiatal hernia, with surrounding postoperative change. 6. Chronic partially healed fracture involving the right superior and inferior pubic rami. 7. Mild degenerative change along the lumbar spine.   Electronically Signed   By: Garald Balding M.D.   On: 12/27/2014 04:55     EKG Interpretation   Date/Time:  Sunday December 27 2014 20:24:58 EDT Ventricular Rate:  70 PR Interval:  146 QRS Duration: 82 QT Interval:  376 QTC Calculation: 406 R Axis:   69 Text Interpretation:  Sinus rhythm with marked sinus arrhythmia Otherwise  normal ECG No significant change since last tracing Confirmed by Glynn Octave  [phone]) on 12/28/2014 12:06:35 AM      MDM   Final diagnoses:  RUQ pain  Gall stones    9:36 PM Labs and RUQ [place] pending. Vitals stable and patient afebrile. Patient will have fluids, morphine, toradol, and zofran for symptoms.   2:09 AM Patient's US shows gallstones without cholecystitis. Patient will be discharged with symptomatic treatment and general surgery follow up. Patient's pain may all be attributed to shingles, however, possible symptomatic gallstones will be evaluated by general surgery.   Alvina Chou, PA-C 12/28/14 0211  Evelina Bucy, MD 12/29/14 [phone]

## 2014-12-28 MED ORDER — SODIUM CHLORIDE 0.9 % IV BOLUS (SEPSIS)
1000.0000 mL | Freq: Once | INTRAVENOUS | Status: AC
  Administered 2014-12-28: 1000 mL via INTRAVENOUS

## 2014-12-28 MED ORDER — PREDNISONE 20 MG PO TABS: 40.0000 mg | ORAL_TABLET | Freq: Every day | ORAL | Status: DC

## 2014-12-28 MED ORDER — ONDANSETRON 4 MG PO TBDP: 4.0000 mg | ORAL_TABLET | Freq: Three times a day (TID) | ORAL | Status: DC | PRN

## 2014-12-28 MED ORDER — GABAPENTIN 300 MG PO CAPS: 300.0000 mg | ORAL_CAPSULE | Freq: Every day | ORAL | Status: DC

## 2014-12-28 MED ORDER — METOCLOPRAMIDE HCL 10 MG PO TABS: 10.0000 mg | ORAL_TABLET | Freq: Four times a day (QID) | ORAL | Status: DC

## 2014-12-28 MED ORDER — MORPHINE SULFATE 4 MG/ML IJ SOLN
4.0000 mg | Freq: Once | INTRAMUSCULAR | Status: AC
  Administered 2014-12-28: 4 mg via INTRAVENOUS
  Filled 2014-12-28: qty 1

## 2014-12-28 MED ORDER — DEXAMETHASONE SODIUM PHOSPHATE 10 MG/ML IJ SOLN
10.0000 mg | Freq: Once | INTRAMUSCULAR | Status: AC
  Administered 2014-12-28: 10 mg via INTRAVENOUS
  Filled 2014-12-28: qty 1

## 2014-12-28 NOTE — Discharge Instructions (Signed)
Take gabapentin as directed for nerve pain. Take zofran or reglan as needed for nausea. Take prednisone as directed until gone.

## 2015-01-11 ENCOUNTER — Ambulatory Visit: Payer: Medicare HMO | Admitting: Pulmonary Disease

## 2015-01-22 ENCOUNTER — Encounter: Payer: Self-pay | Admitting: Pulmonary Disease

## 2015-01-22 ENCOUNTER — Ambulatory Visit (INDEPENDENT_AMBULATORY_CARE_PROVIDER_SITE_OTHER): Payer: Medicare HMO | Admitting: Pulmonary Disease

## 2015-01-22 VITALS — BP 104/68 | HR 64 | Ht 65.0 in | Wt 154.4 lb

## 2015-01-22 DIAGNOSIS — J438 Other emphysema: Secondary | ICD-10-CM | POA: Diagnosis not present

## 2015-01-22 DIAGNOSIS — F17201 Nicotine dependence, unspecified, in remission: Secondary | ICD-10-CM | POA: Insufficient documentation

## 2015-01-22 DIAGNOSIS — F17211 Nicotine dependence, cigarettes, in remission: Secondary | ICD-10-CM | POA: Diagnosis not present

## 2015-01-22 NOTE — Assessment & Plan Note (Signed)
She is a former smoker who smoked as much as 2 packs of cigarettes daily for 45 years. She quit smoking 5 years ago. Today we discussed the risks and benefits of lung cancer screening including the risks of a false positive and anxiety versus unnecessary biopsy. However, we also discussed the benefits of detection of early stage/curable lung cancer. After discussion she is willing to proceed. We will refer her to our lung cancer screening program.

## 2015-01-22 NOTE — Assessment & Plan Note (Signed)
This has been a stable interval for her. Today we reviewed HFA inhaler technique. Prior to instruction I think she was probably only getting about 25% or less of the drug administered appropriately. After instruction she improved to about 75%.  Plan: You Symbicort with a spacer Albuterol as needed Flu shot in the fall Follow-up one year

## 2015-01-22 NOTE — Patient Instructions (Signed)
We will refer you to the lung cancer screening program Use your albuterol as we described today in clinic, I recommend that you use it with the spacer Make sure you rinse her mouth after using an inhaler Stay active and exercises regularly Get a flu shot in the fall We will see you back in one year or sooner if needed

## 2015-01-22 NOTE — Progress Notes (Signed)
Subjective:    Patient ID: Lindsay Porter, female    DOB: [birth year], 74 y.o.   MRN: [id number]   Synopsis: Former patient of Dr. Gwenette Greet with COPD Diagnosed around 2012 with more dyspnea CXR 2013:  No acute process, minimal scarring in bases.  PFT"s 2013:  FEV1 1.39 (65%), ratio 58, TLC 91%, DLCO 59%. Went to Pulmonary Re[facility] at [facility] in 2015  HPI Chief Complaint  Patient presents with  . Follow-up    Former [facility] pt. Pt states stable SOB. States feeling well at today's visit.    Normal has been follwed by my partner for several years for her COPD. n  Pulmonary rehab really helped her last year.  She has not been exercising as much as she should, but she feels better.  She likes the pursed lip breathing.   She is just using Symbicort, no rescue inhaler use to speak of, but about one month ago she started having a few episodes of dyspnea.  Past Medical History  Diagnosis Date  . Arthritis   . Restless leg syndrome   . Seasonal allergies   . Hiatal hernia   . Hyperlipidemia   . Insomnia   . Depression   . Diverticulosis   . Ovarian cyst   . Vitamin D deficiency   . Osteopenia   . Carpal tunnel syndrome   . Alcohol abuse   . Anemia, iron deficiency   . GERD (gastroesophageal reflux disease)   . COPD (chronic obstructive pulmonary disease)   . Wears contact lenses       Review of Systems  Constitutional: Negative for fever, chills and fatigue.  HENT: Negative for postnasal drip, rhinorrhea and sinus pressure.   Respiratory: Positive for shortness of breath. Negative for cough and wheezing.   Cardiovascular: Negative for chest pain, palpitations and leg swelling.       Objective:   Physical Exam Filed Vitals:   01/22/15 1605  BP: 104/68  Pulse: 64  Height: 5\' 5"  (1.651 m)  Weight: 154 lb 6.4 oz (70.035 kg)  SpO2: 95%   RA  Gen: well appearing HENT: OP clear, TM's clear, neck supple PULM: CTA B, normal percussion CV: RRR, no mgr, trace edema GI: BS+,  soft, nontender Derm: no cyanosis or rash Psyche: normal mood and affect   2013 chest x-ray images personally reviewed where she has basilar atelectasis and increased AP diameter     Assessment & Plan:  COPD (chronic obstructive pulmonary disease) This has been a stable interval for her. Today we reviewed HFA inhaler technique. Prior to instruction I think she was probably only getting about 25% or less of the drug administered appropriately. After instruction she improved to about 75%.  Plan: You Symbicort with a spacer Albuterol as needed Flu shot in the fall Follow-up one year  Nicotine dependence in remission She is a former smoker who smoked as much as 2 packs of cigarettes daily for 45 years. She quit smoking 5 years ago. Today we discussed the risks and benefits of lung cancer screening including the risks of a false positive and anxiety versus unnecessary biopsy. However, we also discussed the benefits of detection of early stage/curable lung cancer. After discussion she is willing to proceed. We will refer her to our lung cancer screening program.  > 25 minutes spend in direct consultation   Current outpatient prescriptions:  .  acetaminophen (TYLENOL) 500 MG tablet, Take 500 mg by mouth every 6 (six) hours as needed. For pain,  Disp: , Rfl:  .  budesonide-formoterol (SYMBICORT) 80-4.5 MCG/ACT inhaler, Inhale 2 puffs into the lungs 2 (two) times daily., Disp: 1 Inhaler, Rfl: 3 .  diphenhydrAMINE (BENADRYL) 25 MG tablet, Take 25 mg by mouth as needed. For allergies or itching, Disp: , Rfl:  .  diphenhydramine-acetaminophen (TYLENOL PM) 25-500 MG TABS, Take 1 tablet by mouth at bedtime as needed. Patient states she takes 2 at bedtime, Disp: , Rfl:  .  EPINEPHrine (EPIPEN) 0.3 mg/0.3 mL DEVI, Inject 0.3 mg into the muscle once., Disp: , Rfl:  .  gabapentin (NEURONTIN) 300 MG capsule, Take 1 capsule (300 mg total) by mouth at bedtime., Disp: 30 capsule, Rfl: 0 .  loratadine  (CLARITIN) 10 MG tablet, Take 10 mg by mouth daily., Disp: , Rfl:  .  omeprazole (PRILOSEC) 20 MG capsule, Take 20 mg by mouth daily., Disp: , Rfl:  .  rOPINIRole (REQUIP) 2 MG tablet, Take 2 mg by mouth 2 (two) times daily. Patient taken total of 8 mg daily and decreasing to 6 mg daily, Disp: , Rfl:  .  sertraline (ZOLOFT) 25 MG tablet, Take 25 mg by mouth daily., Disp: , Rfl:  .  albuterol (PROVENTIL HFA;VENTOLIN HFA) 108 (90 BASE) MCG/ACT inhaler, Inhale 2 puffs into the lungs every 6 (six) hours as needed for wheezing or shortness of breath. (Patient not taking: Reported on 01/22/2015), Disp: 1 Inhaler, Rfl: 0 .  HYDROcodone-acetaminophen (NORCO/VICODIN) 5-325 MG per tablet, Take 1 tablet by mouth 2 (two) times daily as needed for severe pain. (Patient not taking: Reported on 01/22/2015), Disp: 10 tablet, Rfl: 0 .  metoCLOPramide (REGLAN) 10 MG tablet, Take 1 tablet (10 mg total) by mouth every 6 (six) hours. (Patient not taking: Reported on 01/22/2015), Disp: 15 tablet, Rfl: 0 .  ondansetron (ZOFRAN ODT) 4 MG disintegrating tablet, Take 1 tablet (4 mg total) by mouth every 8 (eight) hours as needed for nausea or vomiting. (Patient not taking: Reported on 01/22/2015), Disp: 10 tablet, Rfl: 0 .  predniSONE (DELTASONE) 20 MG tablet, Take 2 tablets (40 mg total) by mouth daily. Take 40 mg by mouth daily for 3 days, then 20mg  by mouth daily for 3 days, then 10mg  daily for 3 days (Patient not taking: Reported on 01/22/2015), Disp: 12 tablet, Rfl: 0 .  valACYclovir (VALTREX) 1000 MG tablet, Take 1 tablet (1,000 mg total) by mouth 3 (three) times daily. (Patient not taking: Reported on 01/22/2015), Disp: 21 tablet, Rfl: 0

## 2015-01-26 DIAGNOSIS — Z9189 Other specified personal risk factors, not elsewhere classified: Secondary | ICD-10-CM | POA: Insufficient documentation

## 2015-01-26 DIAGNOSIS — K8020 Calculus of gallbladder without cholecystitis without obstruction: Secondary | ICD-10-CM | POA: Insufficient documentation

## 2015-01-28 ENCOUNTER — Inpatient Hospital Stay: Admission: RE | Admit: 2015-01-28 | Payer: Medicare HMO | Source: Ambulatory Visit

## 2015-02-03 ENCOUNTER — Telehealth: Payer: Self-pay | Admitting: Pulmonary Disease

## 2015-02-03 MED ORDER — BUDESONIDE-FORMOTEROL FUMARATE 80-4.5 MCG/ACT IN AERO: 2.0000 | INHALATION_SPRAY | Freq: Two times a day (BID) | RESPIRATORY_TRACT | Status: DC

## 2015-02-03 NOTE — Telephone Encounter (Signed)
Informed pt Symbicort was being sent to the pharmacy. Nothing further needed.

## 2015-02-10 ENCOUNTER — Inpatient Hospital Stay: Admission: RE | Admit: 2015-02-10 | Payer: Medicare HMO | Source: Ambulatory Visit

## 2015-03-29 ENCOUNTER — Ambulatory Visit (INDEPENDENT_AMBULATORY_CARE_PROVIDER_SITE_OTHER)
Admission: RE | Admit: 2015-03-29 | Discharge: 2015-03-29 | Disposition: A | Discharge status: Home / Self Care | Payer: Medicare HMO | Source: Ambulatory Visit | Attending: Pulmonary Disease | Admitting: Pulmonary Disease

## 2015-03-29 DIAGNOSIS — F17211 Nicotine dependence, cigarettes, in remission: Secondary | ICD-10-CM

## 2015-07-30 ENCOUNTER — Encounter: Payer: Self-pay | Admitting: Pulmonary Disease

## 2015-07-30 ENCOUNTER — Ambulatory Visit (INDEPENDENT_AMBULATORY_CARE_PROVIDER_SITE_OTHER): Payer: Medicare HMO | Admitting: Pulmonary Disease

## 2015-07-30 ENCOUNTER — Ambulatory Visit (INDEPENDENT_AMBULATORY_CARE_PROVIDER_SITE_OTHER)
Admission: RE | Admit: 2015-07-30 | Discharge: 2015-07-30 | Disposition: A | Discharge status: Home / Self Care | Payer: Medicare HMO | Source: Ambulatory Visit | Attending: Pulmonary Disease | Admitting: Pulmonary Disease

## 2015-07-30 VITALS — BP 124/62 | Ht 65.0 in | Wt 171.2 lb

## 2015-07-30 DIAGNOSIS — R0600 Dyspnea, unspecified: Secondary | ICD-10-CM | POA: Diagnosis not present

## 2015-07-30 DIAGNOSIS — J438 Other emphysema: Secondary | ICD-10-CM | POA: Diagnosis not present

## 2015-07-30 DIAGNOSIS — F17211 Nicotine dependence, cigarettes, in remission: Secondary | ICD-10-CM

## 2015-07-30 MED ORDER — FLUTICASONE-SALMETEROL 250-50 MCG/DOSE IN AEPB: 1.0000 | INHALATION_SPRAY | Freq: Two times a day (BID) | RESPIRATORY_TRACT | Status: DC

## 2015-07-30 NOTE — Assessment & Plan Note (Signed)
Continue Low dose CT screening 03/2016

## 2015-07-30 NOTE — Progress Notes (Signed)
Subjective:    Patient ID: Lindsay Porter, female    DOB: [birth year], 75 y.o.   MRN: [id number]   Synopsis: Former patient of Dr. Gwenette Greet with COPD Diagnosed around 2012 with more dyspnea CXR 2013:  No acute process, minimal scarring in bases.  PFT"s 2013:  FEV1 1.39 (65%), ratio 58, TLC 91%, DLCO 59%. Went to Pulmonary Rehab at [facility] in 2015 September 2016 low-dose CT scan IMPRESSION: Scattered small bilateral pulmonary nodules measuring up to 2.7 mm. Lung-RADS Category 2, benign appearance or behavior. Continue annual screening with low-dose chest CT without contrast in 12 months.  HPI Chief Complaint  Patient presents with  . Follow-up    pt. breathing has worsen. increased SOB. denies cough or chest pain/tightness.    Julien has been off of her daily medication (symbicort) because her insurance changed their coverage of the drug.  She has been more dyspneic since stopping this medication. She says that she contacted her insurance company has changed their coverage to Advair. She has used her albuterol recently as she has been out of the Symbicort.  She has gained weight recently and she thinks this has contributed to her dyspnea.    Past Medical History  Diagnosis Date  . Arthritis   . Restless leg syndrome   . Seasonal allergies   . Hiatal hernia   . Hyperlipidemia   . Insomnia   . Depression   . Diverticulosis   . Ovarian cyst   . Vitamin D deficiency   . Osteopenia   . Carpal tunnel syndrome   . Alcohol abuse   . Anemia, iron deficiency   . GERD (gastroesophageal reflux disease)   . COPD (chronic obstructive pulmonary disease) ([place])   . Wears contact lenses       Review of Systems  Constitutional: Negative for fever, chills and fatigue.  HENT: Negative for postnasal drip, rhinorrhea and sinus pressure.   Respiratory: Positive for shortness of breath. Negative for cough and wheezing.   Cardiovascular: Negative for chest pain, palpitations and leg swelling.        Objective:   Physical Exam Filed Vitals:   07/30/15 1522  BP: 124/62  Height: 5\' 5"  (1.651 m)  Weight: 171 lb 3.2 oz (77.656 kg)  SpO2: 91%   RA  Gen: well appearing HENT: OP clear, TM's clear, neck supple PULM: Crackles bases, normal percussion CV: RRR, no mgr, trace edema GI: BS+, soft, nontender Derm: no cyanosis or rash Psyche: normal mood and affect  September 2016 low-dose CT scan images personally reviewed showing no evidence of pulmonary fibrosis, there were some small nodules CBC    Component Value Date/Time   WBC 6.4 12/27/2014 2040   RBC 4.52 12/27/2014 2040   HGB 13.6 12/27/2014 2040   HCT 41.4 12/27/2014 2040   PLT 227 12/27/2014 2040   MCV 91.6 12/27/2014 2040   MCH 30.1 12/27/2014 2040   MCHC 32.9 12/27/2014 2040   RDW 14.0 12/27/2014 2040   LYMPHSABS 0.6* 12/27/2014 2040   MONOABS 0.2 12/27/2014 2040   EOSABS 0.1 12/27/2014 2040   BASOSABS 0.0 12/27/2014 2040          Assessment & Plan:  COPD (chronic obstructive pulmonary disease) ([place]) She has been more symptomatic because she stopped taking her symbicort due to cost reasons.  She says that her insurance company has changed coverage to Advair.  Plan: So today, were going to provide her with Advair samples to see if this is effective. If Advair  is not as effective then we will make an appeal to her insurance company to maintain Symbicort.  I also encouraged her to exercise more as she has gained weight recently.  Nicotine dependence in remission Continue Low dose CT screening 03/2016  Dyspnea She had worsening dyspnea lately which is likely due to the fact that she has been noncompliant with her Symbicort. However, she had new crackles on exam today which is unusual for her. I am going to order a chest x-ray to ensure there is no evidence of another problem. She's also gained weight recently which is likely contributing so I have asked her to exercise regularly and try to lose  weight.     Current outpatient prescriptions:  .  acetaminophen (TYLENOL) 500 MG tablet, Take 500 mg by mouth every 6 (six) hours as needed. For pain, Disp: , Rfl:  .  albuterol (PROVENTIL HFA;VENTOLIN HFA) 108 (90 BASE) MCG/ACT inhaler, Inhale 2 puffs into the lungs every 6 (six) hours as needed for wheezing or shortness of breath., Disp: 1 Inhaler, Rfl: 0 .  budesonide-formoterol (SYMBICORT) 80-4.5 MCG/ACT inhaler, Inhale 2 puffs into the lungs 2 (two) times daily., Disp: 1 Inhaler, Rfl: 3 .  diphenhydrAMINE (BENADRYL) 25 MG tablet, Take 25 mg by mouth as needed. For allergies or itching, Disp: , Rfl:  .  diphenhydramine-acetaminophen (TYLENOL PM) 25-500 MG TABS, Take 1 tablet by mouth at bedtime as needed. Patient states she takes 2 at bedtime, Disp: , Rfl:  .  EPINEPHrine (EPIPEN) 0.3 mg/0.3 mL DEVI, Inject 0.3 mg into the muscle once., Disp: , Rfl:  .  gabapentin (NEURONTIN) 300 MG capsule, Take 1 capsule (300 mg total) by mouth at bedtime., Disp: 30 capsule, Rfl: 0 .  HYDROcodone-acetaminophen (NORCO/VICODIN) 5-325 MG per tablet, Take 1 tablet by mouth 2 (two) times daily as needed for severe pain., Disp: 10 tablet, Rfl: 0 .  loratadine (CLARITIN) 10 MG tablet, Take 10 mg by mouth daily., Disp: , Rfl:  .  metoCLOPramide (REGLAN) 10 MG tablet, Take 1 tablet (10 mg total) by mouth every 6 (six) hours., Disp: 15 tablet, Rfl: 0 .  rOPINIRole (REQUIP) 2 MG tablet, Take 2 mg by mouth 2 (two) times daily. Patient taken total of 8 mg daily and decreasing to 6 mg daily, Disp: , Rfl:  .  sertraline (ZOLOFT) 25 MG tablet, Take 25 mg by mouth daily., Disp: , Rfl:  .  omeprazole (PRILOSEC) 20 MG capsule, Take 20 mg by mouth daily. Reported on 07/30/2015, Disp: , Rfl:  .  Vitamin D, Ergocalciferol, (DRISDOL) 50000 units CAPS capsule, TK 1 C PO 1 TIME A WEEK, Disp: , Rfl: 0

## 2015-07-30 NOTE — Assessment & Plan Note (Signed)
She has been more symptomatic because she stopped taking her symbicort due to cost reasons.  She says that her insurance company has changed coverage to Advair.  Plan: So today, were going to provide her with Advair samples to see if this is effective. If Advair is not as effective then we will make an appeal to her insurance company to maintain Symbicort.  I also encouraged her to exercise more as she has gained weight recently.

## 2015-07-30 NOTE — Patient Instructions (Signed)
Take the Advair 1 puff twice a day, when you near one week left of the sample we have given you, call us to let us know if it is effective If the Advair is working then we will change the Symbicort to Advair If the Advair is not as effective then we will appeal to your insurance company to maintain the Symbicort We will call you with the results of today's chest x-ray We will see you back in 3 months or sooner if needed

## 2015-07-30 NOTE — Assessment & Plan Note (Signed)
She had worsening dyspnea lately which is likely due to the fact that she has been noncompliant with her Symbicort. However, she had new crackles on exam today which is unusual for her. I am going to order a chest x-ray to ensure there is no evidence of another problem. She's also gained weight recently which is likely contributing so I have asked her to exercise regularly and try to lose weight.

## 2015-08-06 ENCOUNTER — Telehealth: Payer: Self-pay | Admitting: *Deleted

## 2015-08-06 DIAGNOSIS — J449 Chronic obstructive pulmonary disease, unspecified: Secondary | ICD-10-CM

## 2015-08-06 NOTE — Telephone Encounter (Signed)
-----   Message from Juanito Doom, MD sent at 08/02/2015  8:33 PM EST ----- A, Please let her know that her CXR was OK with the exception of what was probably a small scar in there left lung.  She needs a repeat CXR in 3-4 weeks. Thanks B

## 2015-08-10 ENCOUNTER — Telehealth: Payer: Self-pay | Admitting: Pulmonary Disease

## 2015-08-10 NOTE — Telephone Encounter (Signed)
atc pt, pt answered, then pressed several buttons on her phone and line ended.  atc back, line was busy.  Wcb.

## 2015-08-10 NOTE — Telephone Encounter (Signed)
Spoke with the pt  She has been taking advair since last ov here 07/30/15 She can not tell a difference between this and the symbicort  She wants to know if there is something else se can try, or what to do next  Please advise thanks!

## 2015-08-10 NOTE — Telephone Encounter (Signed)
Well, I did not expect this to be better than the Symbicort. If it is worse than we will apply for a prior authorization for the Symbicort. If it is the same as the Symbicort and she can switch to that had a lower cost to her. It's really up to her how she wants to proceed. There is not another class of medication that I think we should try that would be better than either of those 2.

## 2015-08-11 NOTE — Telephone Encounter (Signed)
LVM for pt to return call

## 2015-08-11 NOTE — Telephone Encounter (Signed)
Patient Returned call  [phone]

## 2015-08-11 NOTE — Telephone Encounter (Signed)
Called and spoke with pt. She states she would like to keep trying the Advair for another month. Rx was sent to the pharmacy on 07/30/15 with 5 refills. Pt voiced understanding and had no further questions. Nothing further needed at this time.

## 2015-08-16 ENCOUNTER — Telehealth: Payer: Self-pay | Admitting: Pulmonary Disease

## 2015-08-16 MED ORDER — FLUTICASONE-SALMETEROL 250-50 MCG/DOSE IN AEPB: 1.0000 | INHALATION_SPRAY | Freq: Two times a day (BID) | RESPIRATORY_TRACT | Status: DC

## 2015-08-16 NOTE — Telephone Encounter (Signed)
Spoke with pt. She needs a refill on Advair. Rx has been sent in. Nothing further was needed. 

## 2015-08-23 ENCOUNTER — Ambulatory Visit (INDEPENDENT_AMBULATORY_CARE_PROVIDER_SITE_OTHER)
Admission: RE | Admit: 2015-08-23 | Discharge: 2015-08-23 | Disposition: A | Discharge status: Home / Self Care | Payer: Medicare HMO | Source: Ambulatory Visit | Attending: Pulmonary Disease | Admitting: Pulmonary Disease

## 2015-08-23 DIAGNOSIS — J449 Chronic obstructive pulmonary disease, unspecified: Secondary | ICD-10-CM | POA: Diagnosis not present

## 2015-10-06 DIAGNOSIS — K219 Gastro-esophageal reflux disease without esophagitis: Secondary | ICD-10-CM | POA: Insufficient documentation

## 2015-10-06 DIAGNOSIS — J3489 Other specified disorders of nose and nasal sinuses: Secondary | ICD-10-CM | POA: Insufficient documentation

## 2015-10-07 ENCOUNTER — Other Ambulatory Visit: Payer: Self-pay

## 2015-10-07 DIAGNOSIS — Z1231 Encounter for screening mammogram for malignant neoplasm of breast: Secondary | ICD-10-CM

## 2015-10-26 ENCOUNTER — Ambulatory Visit
Admission: RE | Admit: 2015-10-26 | Discharge: 2015-10-26 | Disposition: A | Discharge status: Home / Self Care | Payer: Medicare HMO | Source: Ambulatory Visit

## 2015-10-26 DIAGNOSIS — Z1231 Encounter for screening mammogram for malignant neoplasm of breast: Secondary | ICD-10-CM

## 2015-10-28 ENCOUNTER — Encounter: Payer: Self-pay | Admitting: Pulmonary Disease

## 2015-10-28 ENCOUNTER — Ambulatory Visit (INDEPENDENT_AMBULATORY_CARE_PROVIDER_SITE_OTHER): Payer: Medicare HMO | Admitting: Podiatry

## 2015-10-28 ENCOUNTER — Ambulatory Visit (INDEPENDENT_AMBULATORY_CARE_PROVIDER_SITE_OTHER): Payer: Medicare HMO | Admitting: Pulmonary Disease

## 2015-10-28 ENCOUNTER — Encounter: Payer: Self-pay | Admitting: Podiatry

## 2015-10-28 ENCOUNTER — Ambulatory Visit (INDEPENDENT_AMBULATORY_CARE_PROVIDER_SITE_OTHER): Payer: Medicare HMO

## 2015-10-28 VITALS — BP 124/68 | HR 76 | Ht 65.0 in | Wt 172.0 lb

## 2015-10-28 VITALS — BP 127/71 | HR 76 | Resp 16

## 2015-10-28 DIAGNOSIS — M79673 Pain in unspecified foot: Secondary | ICD-10-CM

## 2015-10-28 DIAGNOSIS — M7751 Other enthesopathy of right foot: Secondary | ICD-10-CM | POA: Diagnosis not present

## 2015-10-28 DIAGNOSIS — R5383 Other fatigue: Secondary | ICD-10-CM | POA: Diagnosis not present

## 2015-10-28 DIAGNOSIS — M19049 Primary osteoarthritis, unspecified hand: Secondary | ICD-10-CM | POA: Insufficient documentation

## 2015-10-28 DIAGNOSIS — J438 Other emphysema: Secondary | ICD-10-CM

## 2015-10-28 DIAGNOSIS — M129 Arthropathy, unspecified: Secondary | ICD-10-CM

## 2015-10-28 DIAGNOSIS — M779 Enthesopathy, unspecified: Principal | ICD-10-CM

## 2015-10-28 DIAGNOSIS — M778 Other enthesopathies, not elsewhere classified: Secondary | ICD-10-CM

## 2015-10-28 DIAGNOSIS — M19079 Primary osteoarthritis, unspecified ankle and foot: Secondary | ICD-10-CM

## 2015-10-28 LAB — URIC ACID: Uric Acid, Serum: 3 mg/dL (ref 2.4–7.0)

## 2015-10-28 LAB — C-REACTIVE PROTEIN: CRP: <0.5 mg/dL (ref <0.60)

## 2015-10-28 LAB — RHEUMATOID FACTOR: Rhuematoid fact SerPl-aCnc: <10 IU/mL (ref <=14)

## 2015-10-28 MED ORDER — AEROCHAMBER MV MISC: Status: DC

## 2015-10-28 MED ORDER — MELOXICAM 7.5 MG PO TABS: 7.5000 mg | ORAL_TABLET | Freq: Every day | ORAL | Status: DC

## 2015-10-28 NOTE — Progress Notes (Signed)
   Subjective:    Patient ID: Lindsay Porter, female    DOB: 1940-08-17, 75 y.o.   MRN: [id number]  HPI: She presents today with her husband with a chief complaint of painful foot right. She states has been bothering her for the past several months and she's tried icing to no avail. She denies any trauma to the foot but states that she has sharp stabbing sensations to the dorsal and dorsal medial aspect of the foot and she points to these areas.    Review of Systems  Constitutional: Positive for fatigue and unexpected weight change.  Respiratory: Positive for apnea, cough, shortness of breath and wheezing.   Musculoskeletal: Positive for myalgias and arthralgias.  Skin: Positive for rash.  Hematological: Bruises/bleeds easily.  Psychiatric/Behavioral: Positive for confusion.  All other systems reviewed and are negative.      Objective:   Physical Exam: Vital signs are stable she is alert and oriented 3. Pulses are strongly palpable. Neurologic sensorium is intact deep tendon reflexes are intact. Muscle strength +5 over 5 dorsiflexion plantar flexors and inverters and everters all intrinsic musculature is intact. Orthopedic evaluation demonstrates all joints distal to the ankle for range of motion without crepitation but she does have severe flatfoot deformity with dorsal spurring and osteoarthritic changes per radiographs taken in the office today.        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  Plan: Injected today with Kenalog and local anesthetic to the right foot. Follow up with her in a few weeks. We did discuss the need for at the very least over-the-counter inserts to help provide some support to the medial longitudinal arch.

## 2015-10-28 NOTE — Assessment & Plan Note (Signed)
This has been worsening recently. Is associated with daytime somnolence, morning headaches, and heavy snoring in the evenings. She has a high pretest probability of having obstructive sleep apnea.  Plan: Split-night study

## 2015-10-28 NOTE — Progress Notes (Signed)
Subjective:    Patient ID: Lindsay Porter, female    DOB: 05-07-1941, 75 y.o.   MRN: [id number]   Synopsis: Former patient of Dr. Gwenette Greet with COPD Diagnosed around 2012 with more dyspnea CXR 2013:  No acute process, minimal scarring in bases.  PFT"s 2013:  FEV1 1.39 (65%), ratio 58, TLC 91%, DLCO 59%. Went to Pulmonary Rehab at [facility] in 2015 September 2016 low-dose CT scan IMPRESSION: Scattered small bilateral pulmonary nodules measuring up to 2.7 mm. Lung-RADS Category 2, benign appearance or behavior. Continue annual screening with low-dose chest CT without contrast in 12 months.  HPI Chief Complaint  Patient presents with  . Follow-up    pt c/o worsening sob with exertion- does more pursed lip breathing than before.  Denies CP, sinus congestion, cough.     Lindsay Porter has been having more arthritis pain recently.  She has been experiencing more shoulder and foot pain lately and just had an injection. She says that her lungs may be worse recently mostly because she hasn't lost any weight and she feels like she is dyspneic with minimal activity.   She feels worse with the advair compared to when she was on the Symbicort. Complicating factors, she has not been exercising at all lately and they feel this is making her more short of breath as well.   No recent cough or mucus production.  She went to ENT recently because of a fluttering in her sinuses that would sometimes "close off her throat" at night.  She was told that the soft palate.   She has never been tested for sleep apnea. She is having a hard time with energy, she feels wasted "all the time".  She feels like she needs to sleep all the time.  She wakes up with a headache a lot.    Past Medical History  Diagnosis Date  . Arthritis   . Restless leg syndrome   . Seasonal allergies   . Hiatal hernia   . Hyperlipidemia   . Insomnia   . Depression   . Diverticulosis   . Ovarian cyst   . Vitamin D deficiency   . Osteopenia   .  Carpal tunnel syndrome   . Alcohol abuse   . Anemia, iron deficiency   . GERD (gastroesophageal reflux disease)   . COPD (chronic obstructive pulmonary disease) ([place])   . Wears contact lenses       Review of Systems  Constitutional: Negative for fever, chills and fatigue.  HENT: Negative for postnasal drip, rhinorrhea and sinus pressure.   Respiratory: Positive for shortness of breath. Negative for cough and wheezing.   Cardiovascular: Negative for chest pain, palpitations and leg swelling.       Objective:   Physical Exam Filed Vitals:   10/28/15 1327  BP: 124/68  Pulse: 76  Height: 5\' 5"  (1.651 m)  Weight: 172 lb (78.019 kg)  SpO2: 95%   RA  Gen: well appearing HENT: OP clear, TM's clear, neck supple PULM: Crackles bases, normal percussion CV: RRR, no mgr, trace edema GI: BS+, soft, nontender Derm: no cyanosis or rash Psyche: normal mood and affect   CBC    Component Value Date/Time   WBC 6.4 12/27/2014 2040   RBC 4.52 12/27/2014 2040   HGB 13.6 12/27/2014 2040   HCT 41.4 12/27/2014 2040   PLT 227 12/27/2014 2040   MCV 91.6 12/27/2014 2040   MCH 30.1 12/27/2014 2040   MCHC 32.9 12/27/2014 2040   RDW 14.0  12/27/2014 2040   LYMPHSABS 0.6* 12/27/2014 2040   MONOABS 0.2 12/27/2014 2040   EOSABS 0.1 12/27/2014 2040   BASOSABS 0.0 12/27/2014 2040          Assessment & Plan:  COPD (chronic obstructive pulmonary disease) ([place]) Her COPD symptoms have worsened since switching from Symbicort to Advair. This is complicated by the fact that she is deconditioned.  Plan: Switch back to Symbicort, we will likely need to start a prior authorization Samples of Symbicort were given today, she will call us after taking it for 10 days to let us know if she feels better on Follow-up 3 months Gradually increase exercise   Fatigue This has been worsening recently. Is associated with daytime somnolence, morning headaches, and heavy snoring in the evenings. She has a  high pretest probability of having obstructive sleep apnea.  Plan: Split-night study  > 50% of visit spent face to face, 27 minute visit   Current outpatient prescriptions:  .  acetaminophen (TYLENOL) 500 MG tablet, Take 500 mg by mouth every 6 (six) hours as needed. For pain, Disp: , Rfl:  .  diphenhydrAMINE (BENADRYL) 25 MG tablet, Take 25 mg by mouth as needed. For allergies or itching, Disp: , Rfl:  .  EPINEPHrine (EPIPEN) 0.3 mg/0.3 mL DEVI, Inject 0.3 mg into the muscle once., Disp: , Rfl:  .  Fluticasone-Salmeterol (ADVAIR DISKUS) 250-50 MCG/DOSE AEPB, Inhale 1 puff into the lungs 2 (two) times daily., Disp: 60 each, Rfl: 5 .  gabapentin (NEURONTIN) 300 MG capsule, Take 1 capsule (300 mg total) by mouth at bedtime., Disp: 30 capsule, Rfl: 0 .  loratadine (CLARITIN) 10 MG tablet, Take 10 mg by mouth daily., Disp: , Rfl:  .  meloxicam (MOBIC) 7.5 MG tablet, Take 1 tablet (7.5 mg total) by mouth daily., Disp: 30 tablet, Rfl: 3 .  omeprazole (PRILOSEC) 20 MG capsule, Take 20 mg by mouth daily. Reported on 07/30/2015, Disp: , Rfl:  .  pramipexole (MIRAPEX) 0.5 MG tablet, Take by mouth., Disp: , Rfl:  .  sertraline (ZOLOFT) 25 MG tablet, Take 25 mg by mouth daily., Disp: , Rfl:  .  Spacer/Aero-Holding Chambers (AEROCHAMBER MV) inhaler, Use as instructed, Disp: 1 each, Rfl: 0

## 2015-10-28 NOTE — Assessment & Plan Note (Signed)
Her COPD symptoms have worsened since switching from Symbicort to Advair. This is complicated by the fact that she is deconditioned.  Plan: Switch back to Symbicort, we will likely need to start a prior authorization Samples of Symbicort were given today, she will call us after taking it for 10 days to let us know if she feels better on Follow-up 3 months Gradually increase exercise

## 2015-10-28 NOTE — Patient Instructions (Signed)
Take Symbicort 2 puffs twice a day with a spacer, no matter how you feel instead of Advair. After 10 days of taking this I want she to call us to let us know how you are doing. Gradually increase your exercise every day as we discussed today in clinic We will call you with the results of the split night study. We will see you back in 3 months or sooner if needed

## 2015-10-29 LAB — CBC WITH DIFFERENTIAL/PLATELET
BASOS ABS: 56 {cells}/uL (ref 0–200)
Basophils Relative: 1 %
EOS ABS: 56 {cells}/uL (ref 15–500)
Eosinophils Relative: 1 %
HEMATOCRIT: 40.2 % (ref 35.0–45.0)
Hemoglobin: 13 g/dL (ref 11.7–15.5)
LYMPHS PCT: 29 %
Lymphs Abs: 1624 cells/uL (ref 850–3900)
MCH: 29.8 pg (ref 27.0–33.0)
MCHC: 32.3 g/dL (ref 32.0–36.0)
MCV: 92.2 fL (ref 80.0–100.0)
MONO ABS: 336 {cells}/uL (ref 200–950)
MPV: 10 fL (ref 7.5–12.5)
Monocytes Relative: 6 %
NEUTROS PCT: 63 %
Neutro Abs: 3528 cells/uL (ref 1500–7800)
Platelets: 251 10*3/uL (ref 140–400)
RBC: 4.36 MIL/uL (ref 3.80–5.10)
RDW: 14.7 % (ref 11.0–15.0)
WBC: 5.6 10*3/uL (ref 3.8–10.8)

## 2015-10-29 LAB — SEDIMENTATION RATE: SED RATE: 1 mm/h (ref 0–30)

## 2015-10-29 LAB — ANTI-NUCLEAR AB-TITER (ANA TITER): ANA Titer 1: 1:40 {titer} — ABNORMAL HIGH

## 2015-10-29 LAB — ANA: ANA: POSITIVE — AB

## 2015-11-01 ENCOUNTER — Ambulatory Visit (HOSPITAL_BASED_OUTPATIENT_CLINIC_OR_DEPARTMENT_OTHER): Payer: Medicare HMO

## 2015-11-03 ENCOUNTER — Ambulatory Visit (HOSPITAL_BASED_OUTPATIENT_CLINIC_OR_DEPARTMENT_OTHER): Payer: Medicare HMO | Attending: Pulmonary Disease | Admitting: Internal Medicine

## 2015-11-03 DIAGNOSIS — R5383 Other fatigue: Secondary | ICD-10-CM | POA: Insufficient documentation

## 2015-11-03 DIAGNOSIS — R0683 Snoring: Secondary | ICD-10-CM | POA: Diagnosis not present

## 2015-11-03 DIAGNOSIS — Z79899 Other long term (current) drug therapy: Secondary | ICD-10-CM | POA: Insufficient documentation

## 2015-11-05 ENCOUNTER — Telehealth: Payer: Self-pay | Admitting: *Deleted

## 2015-11-05 DIAGNOSIS — M19079 Primary osteoarthritis, unspecified ankle and foot: Secondary | ICD-10-CM

## 2015-11-05 DIAGNOSIS — M778 Other enthesopathies, not elsewhere classified: Secondary | ICD-10-CM

## 2015-11-05 DIAGNOSIS — M779 Enthesopathy, unspecified: Principal | ICD-10-CM

## 2015-11-05 NOTE — Telephone Encounter (Addendum)
-----   Message from Garrel Ridgel, [place] sent at 11/01/2015  7:50 AM EDT ----- First see if she has a Rheumatologist. She needs to follow up with  rheumatology for the positive ANA .  Left messages on pt's home and mobile phones to call for results.  11/11/2015-Left message informing pt I was returning her call concerning my message about her labs.  I informed pt of Dr. Stephenie Acres orders and pt states she would like to be referred to a rheumatologist.  I gave pt Dr. Pricilla Larsson office [phone].  Faxed referral, pt clinicals and demographics to [phone], ATTN:  Amy L.

## 2015-11-08 ENCOUNTER — Telehealth: Payer: Self-pay | Admitting: Pulmonary Disease

## 2015-11-08 NOTE — Telephone Encounter (Signed)
Patient states that the Symbicort is working well for her.  She wants to continue this medication. Will need to process a PA for it.   What dose do you have patient on?  Not listed in MED list. ----------- Patient would like results from Sleep Study as well. Not documented in system yet.  Advised patient that we would call her as soon as results have been sent to [place].  Dr. Lake Bells, please advise.

## 2015-11-08 NOTE — Telephone Encounter (Signed)
I am OK with PA Will look for sleep results

## 2015-11-08 NOTE — Telephone Encounter (Signed)
What dose for Symbicort?  80 or 160?

## 2015-11-09 DIAGNOSIS — M19071 Primary osteoarthritis, right ankle and foot: Secondary | ICD-10-CM | POA: Insufficient documentation

## 2015-11-09 NOTE — Telephone Encounter (Signed)
80

## 2015-11-09 NOTE — Procedures (Addendum)
   Patient Name: Lindsay Porter, Lindsay Porter Date: 11/03/2015 Gender: Female D.O.B: March 20, 1941 Age (years): 67 Referring Idil Maslanka: Simonne Maffucci Height (inches): 63 Interpreting Physician: Baird Lyons MD, ABSM Weight (lbs): 170 RPSGT: Gerhard Perches BMI: 30 MRN: [id number] Neck Size: 15.00 CLINICAL INFORMATION Sleep Study Type: NPSG Indication for sleep study: Fatigue, Snoring Epworth Sleepiness Score: 17  SLEEP STUDY TECHNIQUE As per the AASM Manual for the Scoring of Sleep and Associated Events v2.3 (April 2016) with a hypopnea requiring 4% desaturations. The channels recorded and monitored were frontal, central and occipital EEG, electrooculogram (EOG), submentalis EMG (chin), nasal and oral airflow, thoracic and abdominal wall motion, anterior tibialis EMG, snore microphone, electrocardiogram, and pulse oximetry.  MEDICATIONS Patient's medications include: charted for review Medications self-administered by patient during sleep study :SYMBICORT, ADVAIR DISKUS, PRILOSEC, MIRAPEX, SERTRALINE  SLEEP ARCHITECTURE The study was initiated at 10:19:19 PM and ended at 4:41:27 AM. Sleep onset time was 11.0 minutes and the sleep efficiency was 84.5%. The total sleep time was 323.0 minutes. Stage REM latency was 144.0 minutes. The patient spent 17.34% of the night in stage N1 sleep, 75.85% in stage N2 sleep, 1.24% in stage N3 and 5.57% in REM. Alpha intrusion was absent. Supine sleep was 100.00 Wake after sleep onset 48 minutes  RESPIRATORY PARAMETERS The overall apnea/hypopnea index (AHI) was 2.6 per hour. There were 7 total apneas, including 7 obstructive, 0 central and 0 mixed apneas. There were 7 hypopneas and 7 RERAs. The AHI during Stage REM sleep was 30.0 per hour. AHI while supine was 2.6 per hour. The mean oxygen saturation was 91.81%. The minimum SpO2 during sleep was 82.00%. Loud snoring was noted during this study.  CARDIAC DATA The 2 lead EKG demonstrated sinus  rhythm. The mean heart rate was 62.91 beats per minute. Other EKG findings include: None.  LEG MOVEMENT DATA The total PLMS were 17 with a resulting PLMS index of 3.16. Associated arousal with leg movement index was 0.0 .  IMPRESSIONS - No significant obstructive sleep apnea occurred during this study (AHI = 2.6/h). - No significant central sleep apnea occurred during this study (CAI = 0.0/h). - Moderate oxygen desaturation was noted during this study (Min O2 = 82.00%). - The patient snored with Loud snoring volume. - No cardiac abnormalities were noted during this study. - Clinically significant periodic limb movements did not occur during sleep. No significant associated arousals.  DIAGNOSIS - Primary Snoring (786.09 [R06.83 ICD-10]) - Normal study  RECOMMENDATIONS - Avoid alcohol, sedatives and other CNS depressants that may worsen sleep apnea and disrupt normal sleep architecture. - Sleep hygiene should be reviewed to assess factors that may improve sleep quality. - Weight management and regular exercise should be initiated or continued if appropriate.  Deneise Lever Diplomate, American Board of Sleep Medicine  ELECTRONICALLY SIGNED ON:  11/20/2015, 4:29 PM [place] PH: [phone]   FX: [phone] [place]

## 2015-11-11 NOTE — Telephone Encounter (Signed)
Do you have PA info for pt's Symbicort 80? Thanks!

## 2015-11-12 MED ORDER — BUDESONIDE-FORMOTEROL FUMARATE 80-4.5 MCG/ACT IN AERO: 2.0000 | INHALATION_SPRAY | Freq: Two times a day (BID) | RESPIRATORY_TRACT | Status: DC

## 2015-11-12 NOTE — Telephone Encounter (Signed)
I don't have a PA request for this pt.

## 2015-11-12 NOTE — Telephone Encounter (Signed)
Called pharmacy PA needed for Symbicort 80 - 2puff BID [employer] (P) [phone]  Member ID: [id number]  PA initiated over the phone Formulary alternatives are Advair and Breo Aware that patient is stable on current dose with Symbicort and when changed to alternative (Advair) he began having breathing issues.  Symbicort 80 approved through 07/02/2016  Pt aware and Rx sent to pharmacy. Nothing further needed.

## 2015-11-23 ENCOUNTER — Telehealth: Payer: Self-pay

## 2015-11-23 NOTE — Telephone Encounter (Signed)
Pt called back, aware of below results/recs.  Pt does not wish to start nocturnal 02 at this time.  Nothing further needed.

## 2015-11-23 NOTE — Telephone Encounter (Signed)
A,     PLease let her know that she does not have sleep apnea, but her oxygen level dropped while she was sleeping to a mild to moderate degree. This is not severe or a very worrisome finding, but she may feel better if we gave her 2 L O2 to sleep with.    Please Rx.    Thanks    B    lmtcb X1 for pt to review results/recs.

## 2015-11-25 ENCOUNTER — Ambulatory Visit (INDEPENDENT_AMBULATORY_CARE_PROVIDER_SITE_OTHER): Payer: Medicare HMO | Admitting: Podiatry

## 2015-11-25 ENCOUNTER — Encounter: Payer: Self-pay | Admitting: Podiatry

## 2015-11-25 VITALS — BP 114/78 | HR 84 | Resp 12

## 2015-11-25 DIAGNOSIS — M129 Arthropathy, unspecified: Secondary | ICD-10-CM | POA: Diagnosis not present

## 2015-11-25 DIAGNOSIS — M19079 Primary osteoarthritis, unspecified ankle and foot: Secondary | ICD-10-CM

## 2015-11-25 NOTE — Progress Notes (Signed)
She presents today for follow-up of her right foot. She has not seen her rheumatologist as of yet.  Objective: Severe osteoarthritis mid foot right. Pulses remain palpable.  Assessment: Osteoarthritis pes planus right foot.  Plan: We discussed the fact that she needs to follow up with her rheumatologist and we also discussed inserts. I will follow-up with her as needed.

## 2015-12-26 ENCOUNTER — Ambulatory Visit (HOSPITAL_BASED_OUTPATIENT_CLINIC_OR_DEPARTMENT_OTHER): Payer: Medicare HMO

## 2015-12-30 ENCOUNTER — Telehealth: Payer: Self-pay | Admitting: *Deleted

## 2015-12-30 NOTE — Telephone Encounter (Signed)
Pt states she has an appt with Dr. Estanislado Pandy tomorrow, and wanted to know if any labs or x-ray reports would be needed.  I left message informing pt I had faxed all information and Dr. Arlean Hopping office often called if they needed any other information prior to the new pt appt.  I told pt I did have labs if she wanted to hand carry them but I felt they were at Dr. Charlette Caffey office.

## 2016-01-03 ENCOUNTER — Ambulatory Visit (HOSPITAL_COMMUNITY)
Admission: RE | Admit: 2016-01-03 | Discharge: 2016-01-03 | Disposition: A | Discharge status: Home / Self Care | Payer: Medicare HMO | Source: Ambulatory Visit | Attending: Rheumatology | Admitting: Rheumatology

## 2016-01-03 ENCOUNTER — Other Ambulatory Visit (HOSPITAL_COMMUNITY): Payer: Self-pay | Admitting: Rheumatology

## 2016-01-03 DIAGNOSIS — I700 Atherosclerosis of aorta: Secondary | ICD-10-CM | POA: Insufficient documentation

## 2016-01-03 DIAGNOSIS — J449 Chronic obstructive pulmonary disease, unspecified: Secondary | ICD-10-CM | POA: Insufficient documentation

## 2016-01-03 DIAGNOSIS — J984 Other disorders of lung: Secondary | ICD-10-CM | POA: Insufficient documentation

## 2016-01-14 ENCOUNTER — Other Ambulatory Visit: Payer: Self-pay | Admitting: *Deleted

## 2016-01-14 MED ORDER — ALBUTEROL SULFATE HFA 108 (90 BASE) MCG/ACT IN AERS: 2.0000 | INHALATION_SPRAY | Freq: Four times a day (QID) | RESPIRATORY_TRACT | Status: DC | PRN

## 2016-01-25 ENCOUNTER — Ambulatory Visit: Payer: Medicare HMO | Admitting: Pulmonary Disease

## 2016-01-27 DIAGNOSIS — M150 Primary generalized (osteo)arthritis: Secondary | ICD-10-CM | POA: Insufficient documentation

## 2016-01-27 DIAGNOSIS — M8949 Other hypertrophic osteoarthropathy, multiple sites: Secondary | ICD-10-CM | POA: Insufficient documentation

## 2016-01-27 DIAGNOSIS — M159 Polyosteoarthritis, unspecified: Secondary | ICD-10-CM | POA: Insufficient documentation

## 2016-01-27 DIAGNOSIS — M0600 Rheumatoid arthritis without rheumatoid factor, unspecified site: Secondary | ICD-10-CM | POA: Insufficient documentation

## 2016-04-04 ENCOUNTER — Ambulatory Visit: Payer: Medicare HMO | Admitting: Rheumatology

## 2016-10-25 DIAGNOSIS — R69 Illness, unspecified: Secondary | ICD-10-CM | POA: Diagnosis not present

## 2016-11-14 ENCOUNTER — Other Ambulatory Visit: Payer: Self-pay | Admitting: Pulmonary Disease

## 2016-12-06 ENCOUNTER — Other Ambulatory Visit: Payer: Self-pay | Admitting: Pulmonary Disease

## 2016-12-20 ENCOUNTER — Other Ambulatory Visit: Payer: Self-pay | Admitting: Family Medicine

## 2016-12-20 DIAGNOSIS — Z1231 Encounter for screening mammogram for malignant neoplasm of breast: Secondary | ICD-10-CM

## 2017-01-01 ENCOUNTER — Other Ambulatory Visit: Payer: Self-pay | Admitting: Pulmonary Disease

## 2017-01-05 ENCOUNTER — Telehealth: Payer: Self-pay | Admitting: Pulmonary Disease

## 2017-01-05 MED ORDER — BUDESONIDE-FORMOTEROL FUMARATE 80-4.5 MCG/ACT IN AERO
2.0000 | INHALATION_SPRAY | Freq: Two times a day (BID) | RESPIRATORY_TRACT | 6 refills | Status: DC
Start: 2017-01-05 — End: 2017-01-10

## 2017-01-05 NOTE — Telephone Encounter (Signed)
Called and spoke with pt and she is aware of appt  Next week with BQ and refill that has been sent to the pharamcy.

## 2017-01-10 ENCOUNTER — Telehealth: Payer: Self-pay | Admitting: Pulmonary Disease

## 2017-01-10 MED ORDER — BUDESONIDE-FORMOTEROL FUMARATE 80-4.5 MCG/ACT IN AERO
2.0000 | INHALATION_SPRAY | Freq: Two times a day (BID) | RESPIRATORY_TRACT | 2 refills | Status: DC
Start: 2017-01-10 — End: 2017-07-18

## 2017-01-10 NOTE — Telephone Encounter (Signed)
Spoke with pt and advised that Symbicort refills were sent to pharmacy to last until f/y with Dr Lake Bells in September 2018.  Pt verbalized understanding.  Nothing further needed.

## 2017-01-11 ENCOUNTER — Ambulatory Visit: Payer: Medicare HMO | Admitting: Pulmonary Disease

## 2017-01-16 ENCOUNTER — Ambulatory Visit
Admission: RE | Admit: 2017-01-16 | Discharge: 2017-01-16 | Disposition: A | Discharge status: Home / Self Care | Payer: Medicare HMO | Source: Ambulatory Visit | Attending: Family Medicine | Admitting: Family Medicine

## 2017-01-16 DIAGNOSIS — Z1231 Encounter for screening mammogram for malignant neoplasm of breast: Secondary | ICD-10-CM

## 2017-02-01 DIAGNOSIS — F332 Major depressive disorder, recurrent severe without psychotic features: Secondary | ICD-10-CM | POA: Diagnosis not present

## 2017-02-01 DIAGNOSIS — R69 Illness, unspecified: Secondary | ICD-10-CM | POA: Diagnosis not present

## 2017-02-01 DIAGNOSIS — Z0000 Encounter for general adult medical examination without abnormal findings: Secondary | ICD-10-CM | POA: Diagnosis not present

## 2017-02-01 DIAGNOSIS — R1312 Dysphagia, oropharyngeal phase: Secondary | ICD-10-CM | POA: Diagnosis not present

## 2017-02-01 DIAGNOSIS — R5383 Other fatigue: Secondary | ICD-10-CM | POA: Diagnosis not present

## 2017-02-01 DIAGNOSIS — G2581 Restless legs syndrome: Secondary | ICD-10-CM | POA: Diagnosis not present

## 2017-02-01 DIAGNOSIS — K219 Gastro-esophageal reflux disease without esophagitis: Secondary | ICD-10-CM | POA: Diagnosis not present

## 2017-02-27 DIAGNOSIS — M1712 Unilateral primary osteoarthritis, left knee: Secondary | ICD-10-CM | POA: Diagnosis not present

## 2017-02-27 DIAGNOSIS — M25462 Effusion, left knee: Secondary | ICD-10-CM | POA: Diagnosis not present

## 2017-02-27 DIAGNOSIS — G8929 Other chronic pain: Secondary | ICD-10-CM | POA: Insufficient documentation

## 2017-02-27 DIAGNOSIS — M25362 Other instability, left knee: Secondary | ICD-10-CM | POA: Diagnosis not present

## 2017-02-28 ENCOUNTER — Other Ambulatory Visit: Payer: Self-pay | Admitting: Orthopedic Surgery

## 2017-02-28 DIAGNOSIS — J3489 Other specified disorders of nose and nasal sinuses: Secondary | ICD-10-CM | POA: Diagnosis not present

## 2017-02-28 DIAGNOSIS — M25562 Pain in left knee: Secondary | ICD-10-CM

## 2017-02-28 DIAGNOSIS — Z87891 Personal history of nicotine dependence: Secondary | ICD-10-CM | POA: Diagnosis not present

## 2017-02-28 DIAGNOSIS — R1314 Dysphagia, pharyngoesophageal phase: Secondary | ICD-10-CM | POA: Diagnosis not present

## 2017-02-28 DIAGNOSIS — K219 Gastro-esophageal reflux disease without esophagitis: Secondary | ICD-10-CM | POA: Diagnosis not present

## 2017-03-12 ENCOUNTER — Ambulatory Visit
Admission: RE | Admit: 2017-03-12 | Discharge: 2017-03-12 | Disposition: A | Discharge status: Home / Self Care | Payer: Medicare HMO | Source: Ambulatory Visit | Attending: Orthopedic Surgery | Admitting: Orthopedic Surgery

## 2017-03-12 DIAGNOSIS — S83242A Other tear of medial meniscus, current injury, left knee, initial encounter: Secondary | ICD-10-CM | POA: Diagnosis not present

## 2017-03-12 DIAGNOSIS — M25562 Pain in left knee: Secondary | ICD-10-CM

## 2017-03-15 DIAGNOSIS — G8929 Other chronic pain: Secondary | ICD-10-CM | POA: Diagnosis not present

## 2017-03-15 DIAGNOSIS — S83242A Other tear of medial meniscus, current injury, left knee, initial encounter: Secondary | ICD-10-CM | POA: Diagnosis not present

## 2017-03-15 DIAGNOSIS — S83512A Sprain of anterior cruciate ligament of left knee, initial encounter: Secondary | ICD-10-CM | POA: Diagnosis not present

## 2017-03-15 DIAGNOSIS — S83282A Other tear of lateral meniscus, current injury, left knee, initial encounter: Secondary | ICD-10-CM | POA: Diagnosis not present

## 2017-03-15 DIAGNOSIS — M1712 Unilateral primary osteoarthritis, left knee: Secondary | ICD-10-CM | POA: Diagnosis not present

## 2017-03-22 ENCOUNTER — Ambulatory Visit (INDEPENDENT_AMBULATORY_CARE_PROVIDER_SITE_OTHER): Payer: Medicare HMO | Admitting: Pulmonary Disease

## 2017-03-22 ENCOUNTER — Encounter: Payer: Self-pay | Admitting: Pulmonary Disease

## 2017-03-22 ENCOUNTER — Telehealth: Payer: Self-pay | Admitting: Pulmonary Disease

## 2017-03-22 VITALS — BP 126/62 | HR 79 | Ht 65.0 in | Wt 171.0 lb

## 2017-03-22 DIAGNOSIS — Z23 Encounter for immunization: Secondary | ICD-10-CM

## 2017-03-22 DIAGNOSIS — J438 Other emphysema: Secondary | ICD-10-CM

## 2017-03-22 DIAGNOSIS — J449 Chronic obstructive pulmonary disease, unspecified: Secondary | ICD-10-CM

## 2017-03-22 NOTE — Progress Notes (Signed)
Subjective:    Patient ID: Lindsay Porter, female    DOB: 12-24-1940, 76 y.o.   MRN: [id number]   Synopsis: Former patient of Dr. Gwenette Greet with COPD Diagnosed around 2012 with more dyspnea Went to Pulmonary Rehab at [facility] in 2015       HPI Chief Complaint  Patient presents with  . Follow-up    pt states she is stable- sob with exertion.    Lindsay Porter says that her breathing is about the same.  She says that she hasn't been exercising.  She thinks that her weight is up, she hasn't been doing anything regularly.    She says that she has not been on the omeprazole.  She is taking Symbicort.    Past Medical History:  Diagnosis Date  . Alcohol abuse   . Anemia, iron deficiency   . Arthritis   . Carpal tunnel syndrome   . COPD (chronic obstructive pulmonary disease) ([place])   . Depression   . Diverticulosis   . GERD (gastroesophageal reflux disease)   . Hiatal hernia   . Hyperlipidemia   . Insomnia   . Osteopenia   . Ovarian cyst   . Restless leg syndrome   . Seasonal allergies   . Vitamin D deficiency   . Wears contact lenses       Review of Systems  Constitutional: Negative for chills, fatigue and fever.  HENT: Negative for postnasal drip, rhinorrhea and sinus pressure.   Respiratory: Positive for shortness of breath. Negative for cough and wheezing.   Cardiovascular: Negative for chest pain, palpitations and leg swelling.       Objective:   Physical Exam Vitals:   03/22/17 1125  BP: 126/62  Pulse: 79  SpO2: 95%  Weight: 171 lb (77.6 kg)  Height: 5\' 5"  (1.651 m)   RA  Gen: well appearing HENT: OP clear, TM's clear, neck supple PULM: CTA B, normal percussion CV: RRR, no mgr, trace edema GI: BS+, soft, nontender Derm: no cyanosis or rash Psyche: normal mood and affect    CBC    Component Value Date/Time   WBC 5.6 10/28/2015 1234   RBC 4.36 10/28/2015 1234   HGB 13.0 10/28/2015 1234   HCT 40.2 10/28/2015 1234   PLT 251 10/28/2015 1234   MCV 92.2  10/28/2015 1234   MCH 29.8 10/28/2015 1234   MCHC 32.3 10/28/2015 1234   RDW 14.7 10/28/2015 1234   LYMPHSABS 1,624 10/28/2015 1234   MONOABS 336 10/28/2015 1234   EOSABS 56 10/28/2015 1234   BASOSABS 56 10/28/2015 1234    Chest imaging: CXR 2013:  No acute process, minimal scarring in bases.  September 2016 low-dose CT scan IMPRESSION: Scattered small bilateral pulmonary nodules measuring up to 2.7 mm. Lung-RADS Category 2, benign appearance or behavior. Continue annual screening with low-dose chest CT without contrast in 12 months.   PFT: PFT"s 2013:  FEV1 1.39 (65%), ratio 58, TLC 91%, DLCO 59%.      Assessment & Plan:   Other emphysema ([place])  Chronic obstructive pulmonary disease, unspecified COPD type ([place])  Discussion: This has been a stable interval for Lindsay Porter, however she is very confused about how to take her medications. I believe that she has been taking albuterol 2 puffs twice a day and has not been using Symbicort appropriately. Further complicating matters is the fact that Advair was still on her medication list today. We spent an extensive amount of time today reviewing her medications. I would like for her to  come back in about 4-6 weeks to review her medications with our nurse practitioner again.  Plan: For COPD: Flu shot today Keep taking Symbicort 80/4.52 puffs twice a day no matter how you feel Use albuterol 2 puffs every 4-6 hours as needed for chest tightness wheezing or shortness of breath Do not use Advair  We will arrange for a follow-up visit with Tammy. In 4-6 weeks to go over all of your medications. Please bring them all that day.    Current Outpatient Prescriptions:  .  acetaminophen (TYLENOL) 500 MG tablet, Take 500 mg by mouth every 6 (six) hours as needed. For pain, Disp: , Rfl:  .  albuterol (PROVENTIL HFA;VENTOLIN HFA) 108 (90 Base) MCG/ACT inhaler, Inhale 2 puffs into the lungs every 6 (six) hours as needed for wheezing or shortness of  breath., Disp: 1 Inhaler, Rfl: 5 .  budesonide-formoterol (SYMBICORT) 80-4.5 MCG/ACT inhaler, Inhale 2 puffs into the lungs 2 (two) times daily., Disp: 1 Inhaler, Rfl: 2 .  diphenhydrAMINE (BENADRYL) 25 MG tablet, Take 25 mg by mouth as needed. For allergies or itching, Disp: , Rfl:  .  EPINEPHrine (EPIPEN) 0.3 mg/0.3 mL DEVI, Inject 0.3 mg into the muscle once., Disp: , Rfl:  .  gabapentin (NEURONTIN) 300 MG capsule, Take 1 capsule (300 mg total) by mouth at bedtime., Disp: 30 capsule, Rfl: 0 .  loratadine (CLARITIN) 10 MG tablet, Take 10 mg by mouth daily., Disp: , Rfl:  .  meloxicam (MOBIC) 7.5 MG tablet, Take 1 tablet (7.5 mg total) by mouth daily., Disp: 30 tablet, Rfl: 3 .  pramipexole (MIRAPEX) 0.5 MG tablet, Take by mouth., Disp: , Rfl:  .  sertraline (ZOLOFT) 25 MG tablet, Take 25 mg by mouth daily., Disp: , Rfl:  .  Spacer/Aero-Holding Chambers (AEROCHAMBER MV) inhaler, Use as instructed, Disp: 1 each, Rfl: 0

## 2017-03-22 NOTE — Patient Instructions (Signed)
For COPD: Flu shot today Keep taking Symbicort 80/4.52 puffs twice a day no matter how you feel Use albuterol 2 puffs every 4-6 hours as needed for chest tightness wheezing or shortness of breath Do not use Advair  We will arrange for a follow-up visit with Tammy. In 4-6 weeks to go over all of your medications. Please bring them all that day.

## 2017-03-22 NOTE — Telephone Encounter (Signed)
Called pt and left message to call back for more details.

## 2017-03-23 NOTE — Telephone Encounter (Signed)
Called and spoke with the pt and she stated that she is taking her meds correctly and that she did not need the appt with TP in October for the med cal.  Nothing further is needed.

## 2017-04-09 DIAGNOSIS — M25562 Pain in left knee: Secondary | ICD-10-CM | POA: Diagnosis not present

## 2017-04-09 DIAGNOSIS — G8929 Other chronic pain: Secondary | ICD-10-CM | POA: Diagnosis not present

## 2017-04-09 DIAGNOSIS — M1712 Unilateral primary osteoarthritis, left knee: Secondary | ICD-10-CM | POA: Diagnosis not present

## 2017-04-09 DIAGNOSIS — R29898 Other symptoms and signs involving the musculoskeletal system: Secondary | ICD-10-CM | POA: Diagnosis not present

## 2017-04-09 DIAGNOSIS — M25462 Effusion, left knee: Secondary | ICD-10-CM | POA: Diagnosis not present

## 2017-04-23 ENCOUNTER — Encounter: Payer: Medicare HMO | Admitting: Adult Health

## 2017-05-09 DIAGNOSIS — K219 Gastro-esophageal reflux disease without esophagitis: Secondary | ICD-10-CM | POA: Diagnosis not present

## 2017-05-15 DIAGNOSIS — H1089 Other conjunctivitis: Secondary | ICD-10-CM | POA: Diagnosis not present

## 2017-06-22 ENCOUNTER — Telehealth: Payer: Self-pay | Admitting: *Deleted

## 2017-06-22 NOTE — Telephone Encounter (Signed)
Refill request meloxicam. Pt not seen in over 1 year, needs an appt. Return fax denying.

## 2017-07-18 ENCOUNTER — Other Ambulatory Visit: Payer: Self-pay | Admitting: Pulmonary Disease

## 2017-10-08 ENCOUNTER — Other Ambulatory Visit: Payer: Self-pay | Admitting: Pulmonary Disease

## 2018-01-28 DIAGNOSIS — Z6829 Body mass index (BMI) 29.0-29.9, adult: Secondary | ICD-10-CM | POA: Diagnosis not present

## 2018-01-28 DIAGNOSIS — G2581 Restless legs syndrome: Secondary | ICD-10-CM | POA: Diagnosis not present

## 2018-01-28 DIAGNOSIS — R69 Illness, unspecified: Secondary | ICD-10-CM | POA: Diagnosis not present

## 2018-01-28 DIAGNOSIS — E663 Overweight: Secondary | ICD-10-CM | POA: Diagnosis not present

## 2018-01-28 DIAGNOSIS — J449 Chronic obstructive pulmonary disease, unspecified: Secondary | ICD-10-CM | POA: Diagnosis not present

## 2018-01-28 DIAGNOSIS — K219 Gastro-esophageal reflux disease without esophagitis: Secondary | ICD-10-CM | POA: Diagnosis not present

## 2018-02-15 DIAGNOSIS — R69 Illness, unspecified: Secondary | ICD-10-CM | POA: Diagnosis not present

## 2018-02-15 DIAGNOSIS — G2581 Restless legs syndrome: Secondary | ICD-10-CM | POA: Diagnosis not present

## 2018-04-16 DIAGNOSIS — Z6829 Body mass index (BMI) 29.0-29.9, adult: Secondary | ICD-10-CM | POA: Diagnosis not present

## 2018-04-16 DIAGNOSIS — R69 Illness, unspecified: Secondary | ICD-10-CM | POA: Diagnosis not present

## 2018-04-16 DIAGNOSIS — Z23 Encounter for immunization: Secondary | ICD-10-CM | POA: Diagnosis not present

## 2018-05-08 ENCOUNTER — Other Ambulatory Visit: Payer: Self-pay | Admitting: Family Medicine

## 2018-05-08 DIAGNOSIS — Z1231 Encounter for screening mammogram for malignant neoplasm of breast: Secondary | ICD-10-CM

## 2018-06-03 ENCOUNTER — Ambulatory Visit
Admission: RE | Admit: 2018-06-03 | Discharge: 2018-06-03 | Disposition: A | Discharge status: Home / Self Care | Payer: Medicare HMO | Source: Ambulatory Visit | Attending: Family Medicine | Admitting: Family Medicine

## 2018-06-03 DIAGNOSIS — Z1231 Encounter for screening mammogram for malignant neoplasm of breast: Secondary | ICD-10-CM | POA: Diagnosis not present

## 2018-06-22 ENCOUNTER — Ambulatory Visit (HOSPITAL_COMMUNITY)
Admission: EM | Admit: 2018-06-22 | Discharge: 2018-06-22 | Disposition: A | Discharge status: Home / Self Care | Payer: Medicare HMO

## 2018-08-02 DIAGNOSIS — E559 Vitamin D deficiency, unspecified: Secondary | ICD-10-CM | POA: Diagnosis not present

## 2018-08-02 DIAGNOSIS — H6121 Impacted cerumen, right ear: Secondary | ICD-10-CM | POA: Diagnosis not present

## 2018-08-02 DIAGNOSIS — R69 Illness, unspecified: Secondary | ICD-10-CM | POA: Diagnosis not present

## 2018-08-02 DIAGNOSIS — K219 Gastro-esophageal reflux disease without esophagitis: Secondary | ICD-10-CM | POA: Diagnosis not present

## 2018-09-18 DIAGNOSIS — H1033 Unspecified acute conjunctivitis, bilateral: Secondary | ICD-10-CM | POA: Diagnosis not present

## 2018-09-18 DIAGNOSIS — K1120 Sialoadenitis, unspecified: Secondary | ICD-10-CM | POA: Diagnosis not present

## 2018-09-18 DIAGNOSIS — G2581 Restless legs syndrome: Secondary | ICD-10-CM | POA: Diagnosis not present

## 2018-10-26 ENCOUNTER — Other Ambulatory Visit: Payer: Self-pay | Admitting: Pulmonary Disease

## 2018-12-03 ENCOUNTER — Ambulatory Visit (HOSPITAL_COMMUNITY)
Admission: EM | Admit: 2018-12-03 | Discharge: 2018-12-03 | Disposition: A | Discharge status: Home / Self Care | Payer: Medicare HMO | Attending: Family Medicine | Admitting: Family Medicine

## 2018-12-03 ENCOUNTER — Encounter (HOSPITAL_COMMUNITY): Payer: Self-pay

## 2018-12-03 ENCOUNTER — Other Ambulatory Visit: Payer: Self-pay

## 2018-12-03 DIAGNOSIS — J029 Acute pharyngitis, unspecified: Secondary | ICD-10-CM

## 2018-12-03 MED ORDER — CETIRIZINE HCL 10 MG PO TABS: 10.0000 mg | ORAL_TABLET | Freq: Every day | ORAL | 0 refills | Status: DC

## 2018-12-03 MED ORDER — DEXAMETHASONE SODIUM PHOSPHATE 10 MG/ML IJ SOLN
10.0000 mg | Freq: Once | INTRAMUSCULAR | Status: AC
  Administered 2018-12-03: 10 mg via INTRAMUSCULAR

## 2018-12-03 MED ORDER — DEXAMETHASONE SODIUM PHOSPHATE 10 MG/ML IJ SOLN
INTRAMUSCULAR | Status: AC
  Filled 2018-12-03: qty 1

## 2018-12-03 MED ORDER — LIDOCAINE VISCOUS HCL 2 % MT SOLN: 15.0000 mL | OROMUCOSAL | 0 refills | Status: DC | PRN

## 2018-12-03 NOTE — Discharge Instructions (Signed)
I believe that your sore throat is either viral or allergy related. Steroid injection given in clinic. Prescription for lidocaine sent to the pharmacy Try switching your Claritin to Zyrtec daily to see if this helps better If symptoms continue over the next week or worsen you need to follow-up

## 2018-12-03 NOTE — ED Provider Notes (Signed)
[facility]    CSN: [id number] Arrival date & time: 12/03/18  1138     History   Chief Complaint Chief Complaint  Patient presents with  . Gastroesophageal Reflux    HPI Lindsay Porter is a 78 y.o. female.   Patient is a 78 year old female with past medical history of alcohol abuse, anemia, arthritis, COPD, depression, GERD, hyperlipidemia, insomnia, osteopenia, restless leg, seasonal allergies, vitamin D, Larynpharyngeal reflux, hiatal hernia.  She presents with approximately 3 days of worsening burning in the throat area.  Describes it as a Hydrologist at times.  She has had some increased phlegm.  Reports that she takes her omeprazole religiously.  She also takes daily allergy medicine.  Denies any associated cough, chest congestion, shortness of breath or fevers. No abdominal pain, N,V, D.  She has been taking Pepto-Bismol and using Chloraseptic throat spray without any relief.   ROS per HPI      Past Medical History:  Diagnosis Date  . Alcohol abuse   . Anemia, iron deficiency   . Arthritis   . Carpal tunnel syndrome   . COPD (chronic obstructive pulmonary disease) ([place])   . Depression   . Diverticulosis   . GERD (gastroesophageal reflux disease)   . Hiatal hernia   . Hyperlipidemia   . Insomnia   . Osteopenia   . Ovarian cyst   . Restless leg syndrome   . Seasonal allergies   . Vitamin D deficiency   . Wears contact lenses     Patient Active Problem List   Diagnosis Date Noted  . Arthritis of hand, degenerative 10/28/2015  . Fatigue 10/28/2015  . Laryngopharyngeal reflux 10/06/2015  . Nasal obstruction 10/06/2015  . Dyspnea 07/30/2015  . At risk for cardiovascular event 01/26/2015  . Calculus of gallbladder 01/26/2015  . Nicotine dependence in remission 01/22/2015  . Alcohol dependence ([place]) 07/18/2012  . Moderate COPD (chronic obstructive pulmonary disease) ([place]) 07/18/2012  . 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 COPD (chronic  obstructive pulmonary disease) ([place]) 12/05/2011  . Chronic obstructive pulmonary disease ([place]) 12/05/2011  . Arthritis   . Restless leg syndrome   . Seasonal allergies   . Hyperlipidemia   . Insomnia   . Depression   . Diverticulosis   . Vitamin D deficiency   . Osteopenia   . Alcohol abuse   . Anemia, iron deficiency   . Hiatal hernia   . Carpal tunnel syndrome     Past Surgical History:  Procedure Laterality Date  . ANKLE SURGERY  07/07/2009   left  . APPENDECTOMY    . BREAST LUMPECTOMY    . BUNIONECTOMY  2006   left  . COLONOSCOPY    . HIATAL HERNIA REPAIR  04/19/2012   Procedure: LAPAROSCOPIC REPAIR OF HIATAL HERNIA;  Surgeon: Edward Jolly, MD;  Location: [facility];  Service: General;  Laterality: N/A;  . INGUINAL HERNIA REPAIR Right 06/04/2014   Procedure: HERNIA REPAIR INGUINAL ADULT;  Surgeon: Donnie Mesa, MD;  Location: [facility];  Service: General;  Laterality: Right;  . INSERTION OF MESH Right 06/04/2014   Procedure: INSERTION OF MESH;  Surgeon: Donnie Mesa, MD;  Location: [facility];  Service: General;  Laterality: Right;  . KNEE SURGERY  2005   lt  . nose surgery  12/2010  . [place]   right  . TONSILLECTOMY    . UPPER GI ENDOSCOPY      OB History   No  obstetric history on file.      Home Medications    Prior to Admission medications   Medication Sig Start Date End Date Taking? Authorizing Provider  acetaminophen (TYLENOL) 500 MG tablet Take 500 mg by mouth every 6 (six) hours as needed. For pain    [provider]  cetirizine (ZYRTEC) 10 MG tablet Take 1 tablet (10 mg total) by mouth daily. 12/03/18   Loura Halt A, NP  diphenhydrAMINE (BENADRYL) 25 MG tablet Take 25 mg by mouth as needed. For allergies or itching    [provider]  EPINEPHrine (EPIPEN) 0.3 mg/0.3 mL DEVI Inject 0.3 mg into the muscle once.    [provider]  gabapentin (NEURONTIN) 300 MG capsule Take 1  capsule (300 mg total) by mouth at bedtime. 12/28/14   Szekalski, Kaitlyn, PA-C  lidocaine (XYLOCAINE) 2 % solution Use as directed 15 mLs in the mouth or throat as needed for mouth pain. 12/03/18   Loura Halt A, NP  loratadine (CLARITIN) 10 MG tablet Take 10 mg by mouth daily.    [provider]  meloxicam (MOBIC) 7.5 MG tablet Take 1 tablet (7.5 mg total) by mouth daily. 10/28/15   Hyatt, Max T, DPM  pramipexole (MIRAPEX) 0.5 MG tablet Take by mouth. 06/08/15   [provider]  sertraline (ZOLOFT) 25 MG tablet Take 25 mg by mouth daily.    [provider]  Spacer/Aero-Holding Chambers (AEROCHAMBER MV) inhaler Use as instructed 10/28/15   Juanito Doom, MD  SYMBICORT 80-4.5 MCG/ACT inhaler INHALE 2 PUFFS TWICE A DAY 07/18/17   Juanito Doom, MD  VENTOLIN HFA 108 (90 Base) MCG/ACT inhaler INHALE 2 PUFFS INTO THE LUNGS EVERY 6 HOURS AS NEEDED FOR WHEEZING OR SHORTNESS OF BREATH 10/27/18   Juanito Doom, MD    Family History Family History  Problem Relation Age of Onset  . Rheum arthritis Mother   . Asthma Mother   . Arthritis Sister     Social History Social History   Tobacco Use  . Smoking status: Former Smoker    Packs/day: 2.00    Years: 45.00    Pack years: 90.00    Types: Cigarettes    Last attempt to quit: 10/02/2007    Years since quitting: 11.1  . Smokeless tobacco: Never Used  Substance Use Topics  . Alcohol use: No    Comment: quit in 2011  . Drug use: No     Allergies   Singulair [montelukast sodium] and Sulfa antibiotics   Review of Systems Review of Systems   Physical Exam Triage Vital Signs ED Triage Vitals  Enc Vitals Group     BP 12/03/18 1155 (!) 127/111     Pulse Rate 12/03/18 1155 72     Resp 12/03/18 1155 18     Temp 12/03/18 1155 98.4 F (36.9 C)     Temp src --      SpO2 12/03/18 1155 97 %     Weight 12/03/18 1153 170 lb (77.1 kg)     Height --      Head Circumference --      Peak Flow --      Pain  Score 12/03/18 1153 8     Pain Loc --      Pain Edu? --      Excl. in [place]? --    No data found.  Updated Vital Signs BP (!) 127/111 (BP Location: Left Arm)   Pulse 72   Temp 98.4  F (36.9 C)   Resp 18   Wt 170 lb (77.1 kg)   SpO2 97%   BMI 28.29 kg/m   Visual Acuity Right Eye Distance:   Left Eye Distance:   Bilateral Distance:    Right Eye Near:   Left Eye Near:    Bilateral Near:     Physical Exam Vitals signs and nursing note reviewed.  Constitutional:      General: She is not in acute distress.    Appearance: Normal appearance. She is not ill-appearing, toxic-appearing or diaphoretic.  HENT:     Head: Normocephalic and atraumatic.     Right Ear: Tympanic membrane and ear canal normal.     Left Ear: Tympanic membrane and ear canal normal.     Nose: Nose normal.     Mouth/Throat:     Pharynx: Oropharynx is clear.  Eyes:     Conjunctiva/sclera: Conjunctivae normal.  Neck:     Musculoskeletal: Normal range of motion.  Cardiovascular:     Rate and Rhythm: Normal rate and regular rhythm.     Pulses: Normal pulses.     Heart sounds: Normal heart sounds.  Pulmonary:     Effort: Pulmonary effort is normal.     Comments: Mild expiratory wheezing.  Musculoskeletal: Normal range of motion.  Lymphadenopathy:     Cervical: No cervical adenopathy.  Skin:    General: Skin is warm and dry.  Neurological:     Mental Status: She is alert.      UC Treatments / Results  Labs (all labs ordered are listed, but only abnormal results are displayed) Labs Reviewed - No data to display  EKG None  Radiology No results found.  Procedures Procedures (including critical care time)  Medications Ordered in UC Medications  dexamethasone (DECADRON) injection 10 mg (10 mg Intramuscular Given 12/03/18 1224)    Initial Impression / Assessment and Plan / UC Course  I have reviewed the triage vital signs and the nursing notes.  Pertinent labs & imaging results that were  available during my care of the patient were reviewed by me and considered in my medical decision making (see chart for details).    Patient is a 78 year old female that presents with burning in the throat area worsening over the last couple days. She has also had a lot of increased phlegm Pt symptoms could be related to allergies, viral illness or her pharyngeal reflux.   VSS and she is non toxic or ill appearing. No concern for airway compromise. No coughing or trouble breathing,  Recommended changing allergy meds to see if this helps.  Continue the omeprazole. She reports that she takes it religiously.  Lidocaine for the throat discomfort.  Dexamethasone given for inflammation.  Instructed that if her symptoms continue or worsen she will need to follow-up with her doctor for further evaluation and management. Patient understanding and agrees Final Clinical Impressions(s) / UC Diagnoses   Final diagnoses:  Sore throat     Discharge Instructions     I believe that your sore throat is either viral or allergy related. Steroid injection given in clinic. Prescription for lidocaine sent to the pharmacy Try switching your Claritin to Zyrtec daily to see if this helps better If symptoms continue over the next week or worsen you need to follow-up    ED Prescriptions    Medication Sig Dispense Auth. Provider   lidocaine (XYLOCAINE) 2 % solution Use as directed 15 mLs in the mouth or throat as needed  for mouth pain. 100 mL Olevia Westervelt A, NP   cetirizine (ZYRTEC) 10 MG tablet Take 1 tablet (10 mg total) by mouth daily. 30 tablet Loura Halt A, NP     Controlled Substance Prescriptions [place] Controlled Substance Registry consulted? Not Applicable   Orvan July, NP 12/03/18 1235

## 2018-12-03 NOTE — ED Notes (Signed)
Patient able to ambulate independently  

## 2018-12-03 NOTE — ED Triage Notes (Signed)
Pt states she has acid reflux and she says its burns in her throat. This has been going on for 3 days.

## 2018-12-31 DIAGNOSIS — R69 Illness, unspecified: Secondary | ICD-10-CM | POA: Diagnosis not present

## 2019-01-08 ENCOUNTER — Encounter (HOSPITAL_COMMUNITY): Payer: Self-pay

## 2019-01-08 ENCOUNTER — Other Ambulatory Visit: Payer: Self-pay

## 2019-01-08 ENCOUNTER — Emergency Department (HOSPITAL_COMMUNITY)
Admission: EM | Admit: 2019-01-08 | Discharge: 2019-01-09 | Disposition: A | Discharge status: Home / Self Care | Payer: Medicare HMO | Attending: Emergency Medicine | Admitting: Emergency Medicine

## 2019-01-08 ENCOUNTER — Emergency Department (HOSPITAL_COMMUNITY): Payer: Medicare HMO

## 2019-01-08 DIAGNOSIS — S299XXA Unspecified injury of thorax, initial encounter: Secondary | ICD-10-CM | POA: Diagnosis not present

## 2019-01-08 DIAGNOSIS — S3993XA Unspecified injury of pelvis, initial encounter: Secondary | ICD-10-CM | POA: Diagnosis not present

## 2019-01-08 DIAGNOSIS — Z79899 Other long term (current) drug therapy: Secondary | ICD-10-CM | POA: Insufficient documentation

## 2019-01-08 DIAGNOSIS — J449 Chronic obstructive pulmonary disease, unspecified: Secondary | ICD-10-CM | POA: Diagnosis not present

## 2019-01-08 DIAGNOSIS — T1490XA Injury, unspecified, initial encounter: Secondary | ICD-10-CM

## 2019-01-08 DIAGNOSIS — W010XXA Fall on same level from slipping, tripping and stumbling without subsequent striking against object, initial encounter: Secondary | ICD-10-CM | POA: Insufficient documentation

## 2019-01-08 DIAGNOSIS — Y999 Unspecified external cause status: Secondary | ICD-10-CM | POA: Diagnosis not present

## 2019-01-08 DIAGNOSIS — R079 Chest pain, unspecified: Secondary | ICD-10-CM | POA: Diagnosis not present

## 2019-01-08 DIAGNOSIS — R109 Unspecified abdominal pain: Secondary | ICD-10-CM | POA: Diagnosis not present

## 2019-01-08 DIAGNOSIS — W19XXXA Unspecified fall, initial encounter: Secondary | ICD-10-CM

## 2019-01-08 DIAGNOSIS — Z87891 Personal history of nicotine dependence: Secondary | ICD-10-CM | POA: Insufficient documentation

## 2019-01-08 DIAGNOSIS — S3991XA Unspecified injury of abdomen, initial encounter: Secondary | ICD-10-CM | POA: Diagnosis not present

## 2019-01-08 DIAGNOSIS — Y939 Activity, unspecified: Secondary | ICD-10-CM | POA: Diagnosis not present

## 2019-01-08 DIAGNOSIS — R55 Syncope and collapse: Secondary | ICD-10-CM | POA: Insufficient documentation

## 2019-01-08 DIAGNOSIS — S199XXA Unspecified injury of neck, initial encounter: Secondary | ICD-10-CM | POA: Diagnosis not present

## 2019-01-08 DIAGNOSIS — S3992XA Unspecified injury of lower back, initial encounter: Secondary | ICD-10-CM | POA: Diagnosis not present

## 2019-01-08 DIAGNOSIS — M545 Low back pain: Secondary | ICD-10-CM | POA: Diagnosis not present

## 2019-01-08 DIAGNOSIS — M546 Pain in thoracic spine: Secondary | ICD-10-CM | POA: Diagnosis not present

## 2019-01-08 DIAGNOSIS — S2242XA Multiple fractures of ribs, left side, initial encounter for closed fracture: Secondary | ICD-10-CM | POA: Diagnosis not present

## 2019-01-08 DIAGNOSIS — Y92009 Unspecified place in unspecified non-institutional (private) residence as the place of occurrence of the external cause: Secondary | ICD-10-CM | POA: Insufficient documentation

## 2019-01-08 DIAGNOSIS — S0990XA Unspecified injury of head, initial encounter: Secondary | ICD-10-CM | POA: Diagnosis not present

## 2019-01-08 MED ORDER — OXYCODONE-ACETAMINOPHEN 5-325 MG PO TABS
1.0000 | ORAL_TABLET | Freq: Once | ORAL | Status: AC
  Administered 2019-01-08: 1 via ORAL
  Filled 2019-01-08: qty 1

## 2019-01-08 NOTE — ED Triage Notes (Signed)
Pt reports tripping on her rug at home and falling onto her left side. Pt c.o left sided rib pain that radiates to her back. Pt denies LOC or hitting her head.

## 2019-01-09 ENCOUNTER — Emergency Department (HOSPITAL_COMMUNITY): Payer: Medicare HMO

## 2019-01-09 DIAGNOSIS — S299XXA Unspecified injury of thorax, initial encounter: Secondary | ICD-10-CM | POA: Diagnosis not present

## 2019-01-09 DIAGNOSIS — S199XXA Unspecified injury of neck, initial encounter: Secondary | ICD-10-CM | POA: Diagnosis not present

## 2019-01-09 DIAGNOSIS — M546 Pain in thoracic spine: Secondary | ICD-10-CM | POA: Diagnosis not present

## 2019-01-09 DIAGNOSIS — S2242XA Multiple fractures of ribs, left side, initial encounter for closed fracture: Secondary | ICD-10-CM | POA: Diagnosis not present

## 2019-01-09 DIAGNOSIS — R109 Unspecified abdominal pain: Secondary | ICD-10-CM | POA: Diagnosis not present

## 2019-01-09 DIAGNOSIS — S3991XA Unspecified injury of abdomen, initial encounter: Secondary | ICD-10-CM | POA: Diagnosis not present

## 2019-01-09 DIAGNOSIS — S3992XA Unspecified injury of lower back, initial encounter: Secondary | ICD-10-CM | POA: Diagnosis not present

## 2019-01-09 DIAGNOSIS — S3993XA Unspecified injury of pelvis, initial encounter: Secondary | ICD-10-CM | POA: Diagnosis not present

## 2019-01-09 DIAGNOSIS — M545 Low back pain: Secondary | ICD-10-CM | POA: Diagnosis not present

## 2019-01-09 DIAGNOSIS — S0990XA Unspecified injury of head, initial encounter: Secondary | ICD-10-CM | POA: Diagnosis not present

## 2019-01-09 LAB — BASIC METABOLIC PANEL
Anion gap: 10 (ref 5–15)
BUN: 17 mg/dL (ref 8–23)
CO2: 24 mmol/L (ref 22–32)
Calcium: 9.3 mg/dL (ref 8.9–10.3)
Chloride: 101 mmol/L (ref 98–111)
Creatinine, Ser: 0.58 mg/dL (ref 0.44–1.00)
GFR calc Af Amer: >60 mL/min (ref >60)
GFR calc non Af Amer: >60 mL/min (ref >60)
Glucose, Bld: 148 mg/dL — ABNORMAL HIGH (ref 70–99)
Potassium: 5 mmol/L (ref 3.5–5.1)
Sodium: 135 mmol/L (ref 135–145)

## 2019-01-09 LAB — CBC
HCT: 43.3 % (ref 36.0–46.0)
Hemoglobin: 13.8 g/dL (ref 12.0–15.0)
MCH: 30.7 pg (ref 26.0–34.0)
MCHC: 31.9 g/dL (ref 30.0–36.0)
MCV: 96.2 fL (ref 80.0–100.0)
Platelets: 184 10*3/uL (ref 150–400)
RBC: 4.5 MIL/uL (ref 3.87–5.11)
RDW: 13.3 % (ref 11.5–15.5)
WBC: 7 10*3/uL (ref 4.0–10.5)
nRBC: 0 % (ref 0.0–0.2)

## 2019-01-09 LAB — I-STAT CREATININE, ED: Creatinine, Ser: 0.5 mg/dL (ref 0.44–1.00)

## 2019-01-09 MED ORDER — IOHEXOL 300 MG/ML  SOLN
100.0000 mL | Freq: Once | INTRAMUSCULAR | Status: AC | PRN
  Administered 2019-01-09: 100 mL via INTRAVENOUS

## 2019-01-09 MED ORDER — ONDANSETRON HCL 4 MG/2ML IJ SOLN
4.0000 mg | Freq: Once | INTRAMUSCULAR | Status: AC
  Administered 2019-01-09: 4 mg via INTRAVENOUS
  Filled 2019-01-09: qty 2

## 2019-01-09 MED ORDER — CYCLOBENZAPRINE HCL 5 MG PO TABS: 5.0000 mg | ORAL_TABLET | Freq: Two times a day (BID) | ORAL | 0 refills | Status: DC | PRN

## 2019-01-09 MED ORDER — MORPHINE SULFATE (PF) 4 MG/ML IV SOLN
4.0000 mg | Freq: Once | INTRAVENOUS | Status: AC
  Administered 2019-01-09: 4 mg via INTRAVENOUS
  Filled 2019-01-09: qty 1

## 2019-01-09 MED ORDER — ALBUTEROL SULFATE HFA 108 (90 BASE) MCG/ACT IN AERS
8.0000 | INHALATION_SPRAY | Freq: Once | RESPIRATORY_TRACT | Status: AC
  Administered 2019-01-09: 8 via RESPIRATORY_TRACT
  Filled 2019-01-09: qty 6.7

## 2019-01-09 MED ORDER — HYDROCODONE-ACETAMINOPHEN 5-325 MG PO TABS: 1.0000 | ORAL_TABLET | ORAL | 0 refills | Status: DC | PRN

## 2019-01-09 NOTE — Discharge Instructions (Addendum)
Your work-up today revealed evidence of 2 left lateral rib fractures from the fall.  Patient other work-up is reassuring and your oxygen was normal when you ambulated, we feel you are safe for discharge home with outpatient pain and muscle relaxant management.  Please take the medications and use the incentive spirometer to help prevent pneumonia and to help them heal.  Please follow-up with your primary doctor.  If any symptoms change or worsen, please return to the nearest emergency department.

## 2019-01-09 NOTE — ED Provider Notes (Signed)
TIME SEEN: 4:21 AM  CHIEF COMPLAINT: Fall  HPI: Patient is a 78 year old female with history of COPD, hyperlipidemia, alcohol abuse who presents to the emergency department after she had a mechanical fall.  States that she thinks that she may have tripped and fallen into the wall and then fell down to the ground.  States that her left elbow made a large dent in the wall but she is not having any elbow pain.  She is complaining of severe right posterior rib pain, thoracic and lumbar spine pain.  She states she is not sure if she hit her head.  No numbness, tingling or focal weakness.  She is having a very hard time breathing and is now wheezing.  She is not on blood thinners.  ROS: See HPI Constitutional: no fever  Eyes: no drainage  ENT: no runny nose   Cardiovascular:  no chest pain  Resp: no SOB  GI: no vomiting GU: no dysuria Integumentary: no rash  Allergy: no hives  Musculoskeletal: no leg swelling  Neurological: no slurred speech ROS otherwise negative  PAST MEDICAL HISTORY/PAST SURGICAL HISTORY:  Past Medical History:  Diagnosis Date  . Alcohol abuse   . Anemia, iron deficiency   . Arthritis   . Carpal tunnel syndrome   . COPD (chronic obstructive pulmonary disease) ([place])   . Depression   . Diverticulosis   . GERD (gastroesophageal reflux disease)   . Hiatal hernia   . Hyperlipidemia   . Insomnia   . Osteopenia   . Ovarian cyst   . Restless leg syndrome   . Seasonal allergies   . Vitamin D deficiency   . Wears contact lenses     MEDICATIONS:  Prior to Admission medications   Medication Sig Start Date End Date Taking? Authorizing Provider  acetaminophen (TYLENOL) 500 MG tablet Take 500 mg by mouth every 6 (six) hours as needed. For pain    [provider]  cetirizine (ZYRTEC) 10 MG tablet Take 1 tablet (10 mg total) by mouth daily. 12/03/18   Loura Halt A, NP  diphenhydrAMINE (BENADRYL) 25 MG tablet Take 25 mg by mouth as needed. For allergies or itching     [provider]  EPINEPHrine (EPIPEN) 0.3 mg/0.3 mL DEVI Inject 0.3 mg into the muscle once.    [provider]  gabapentin (NEURONTIN) 300 MG capsule Take 1 capsule (300 mg total) by mouth at bedtime. 12/28/14   Szekalski, Kaitlyn, PA-C  lidocaine (XYLOCAINE) 2 % solution Use as directed 15 mLs in the mouth or throat as needed for mouth pain. 12/03/18   Loura Halt A, NP  loratadine (CLARITIN) 10 MG tablet Take 10 mg by mouth daily.    [provider]  meloxicam (MOBIC) 7.5 MG tablet Take 1 tablet (7.5 mg total) by mouth daily. 10/28/15   Hyatt, Max T, DPM  pramipexole (MIRAPEX) 0.5 MG tablet Take by mouth. 06/08/15   [provider]  sertraline (ZOLOFT) 25 MG tablet Take 25 mg by mouth daily.    [provider]  Spacer/Aero-Holding Chambers (AEROCHAMBER MV) inhaler Use as instructed 10/28/15   Juanito Doom, MD  SYMBICORT 80-4.5 MCG/ACT inhaler INHALE 2 PUFFS TWICE A DAY 07/18/17   Juanito Doom, MD  VENTOLIN HFA 108 (90 Base) MCG/ACT inhaler INHALE 2 PUFFS INTO THE LUNGS EVERY 6 HOURS AS NEEDED FOR WHEEZING OR SHORTNESS OF BREATH 10/27/18   Juanito Doom, MD    ALLERGIES:  Allergies  Allergen Reactions  . Singulair [  Montelukast Sodium] Hives  . Sulfa Antibiotics Other (See Comments)    Childhood reaction     SOCIAL HISTORY:  Social History   Tobacco Use  . Smoking status: Former Smoker    Packs/day: 2.00    Years: 45.00    Pack years: 90.00    Types: Cigarettes    Quit date: 10/02/2007    Years since quitting: 11.2  . Smokeless tobacco: Never Used  Substance Use Topics  . Alcohol use: No    Comment: quit in 2011    FAMILY HISTORY: Family History  Problem Relation Age of Onset  . Rheum arthritis Mother   . Asthma Mother   . Arthritis Sister     EXAM: BP (!) 147/90 (BP Location: Left Arm)   Pulse 64   Temp 98.3 F (36.8 C) (Oral)   Resp 14   SpO2 93%  CONSTITUTIONAL: Alert and oriented and responds appropriately  to questions.  Elderly, appears extremely uncomfortable, grimaces in pain with any minimal movement HEAD: Normocephalic; atraumatic EYES: Conjunctivae clear, PERRL, EOMI ENT: normal nose; no rhinorrhea; moist mucous membranes; pharynx without lesions noted; no dental injury; no septal hematoma NECK: Supple, no meningismus, no LAD; no midline spinal tenderness, step-off or deformity; trachea midline CARD: RRR; S1 and S2 appreciated; no murmurs, no clicks, no rubs, no gallops RESP: Patient is splinting and tachypneic.  She has wheezing diffusely.  No rhonchi or rales.  No hypoxia. CHEST:  chest wall stable, no crepitus or ecchymosis or deformity, she is tender to palpation over the posterior left lower ribs ABD/GI: Normal bowel sounds; non-distended; soft, non-tender, no rebound, no guarding; no ecchymosis or other lesions noted PELVIS:  stable, nontender to palpation BACK: Patient is exquisitely tender to palpation over the lower thoracic and upper lumbar spine without step-off or deformity.  She also appears to have some redness and possible bruising to the left flank.  She is very tender to palpation over the posterior left ribs. EXT: Patient has some bruising to the left elbow but no bony tenderness and full range of motion in this joint.  Normal ROM in all joints; non-tender to palpation; no edema; normal capillary refill; no cyanosis, no bony tenderness or bony deformity of patient's extremities, no joint effusion, compartments are soft, extremities are warm and well-perfused SKIN: Normal color for age and race; warm NEURO: Moves all extremities equally, normal sensation diffusely, normal speech, no facial asymmetry PSYCH: The patient's mood and manner are appropriate. Grooming and personal hygiene are appropriate.  MEDICAL DECISION MAKING: Patient here with ground-level fall but appears extremely uncomfortable.  Her x-ray shows a pulmonary density at the left lung base that is worsened since  2017 but no sign of traumatic injury.  She appears to have progressively worsened while waiting in the waiting room and is now splinting, wheezing.  She is not hypoxic.  Will give albuterol.  She does have extreme tenderness over the posterior ribs on the left side, left flank, thoracic and lumbar spine.  I feel she will need CT imaging.  She does have some redness and possible bruising to the left flank area but she states she has a history of chronic urticaria and feels this may be secondary to her urticaria.  I discussed with her that I am concerned for possible retroperitoneal bleed given these findings and her significant pain in this area.  Will obtain CT imaging.  Will give morphine, Zofran for symptomatic relief.  ED PROGRESS: Patient's imaging pending.  Signed out  to oncoming ED physician to follow-up on imaging and reassess patient.   I reviewed all nursing notes, vitals, pertinent previous records, EKGs, lab and urine results, imaging (as available).      Ward, Delice Bison, DO 01/09/19 [phone]

## 2019-01-09 NOTE — ED Notes (Signed)
Pt. Ambulated with pulse ox. Oxygen sat. Stayed between 92-93 with a good pleth.

## 2019-01-09 NOTE — ED Notes (Signed)
Pt given incentive spirometer for home use. Pt educated on use.

## 2019-01-09 NOTE — ED Provider Notes (Signed)
7:33 AM Care assumed from Dr. Leonides Schanz.  At time of transfer care, patient is awaiting results of diagnostic imaging to look for traumatic injuries after a fall.  Anticipate following up on diagnostic imaging results.  9:23 AM CT imaging returned showing evidence of 2 left-sided rib fractures.  Oxygen saturations been labile, will ambulate patient with pulse ox to see if she is hypoxic.  Patient is hypoxic, will admit for a COPD patient with rib fractures and hypoxia.  If oxygen remains above 90, will discharge home with pain medication and shared decision made conversation.  11:17 AM Patient was able to ambulate without hypoxia.  She was feeling better.  Patient be discharged with muscle relaxants and pain medication at her request and outpatient follow-up.  She understands return precautions and will be given incentive spirometer.  Patient discharged in good condition with improved symptoms.     Clinical Impression: 1. Fall, initial encounter   2. Trauma   3. Closed fracture of multiple ribs of left side, initial encounter     Disposition: Discharge  Condition: Good  I have discussed the results, Dx and Tx plan with the pt(& family if present). He/she/they expressed understanding and agree(s) with the plan. Discharge instructions discussed at great length. Strict return precautions discussed and pt &/or family have verbalized understanding of the instructions. No further questions at time of discharge.    New Prescriptions   CYCLOBENZAPRINE (FLEXERIL) 5 MG TABLET    Take 1 tablet (5 mg total) by mouth 2 (two) times daily as needed for muscle spasms.   HYDROCODONE-ACETAMINOPHEN (NORCO/VICODIN) 5-325 MG TABLET    Take 1 tablet by mouth every 4 (four) hours as needed.    Follow Up: Kathyrn Lass, MD [place] [address] [place] [facility] [address] [id number] mc [place]  [place]           Domini Vandehei, Gwenyth Allegra, MD 01/09/19 1120

## 2019-01-10 DIAGNOSIS — S2242XD Multiple fractures of ribs, left side, subsequent encounter for fracture with routine healing: Secondary | ICD-10-CM | POA: Diagnosis not present

## 2019-01-21 DIAGNOSIS — J449 Chronic obstructive pulmonary disease, unspecified: Secondary | ICD-10-CM | POA: Diagnosis not present

## 2019-01-21 DIAGNOSIS — R69 Illness, unspecified: Secondary | ICD-10-CM | POA: Diagnosis not present

## 2019-01-21 DIAGNOSIS — F331 Major depressive disorder, recurrent, moderate: Secondary | ICD-10-CM | POA: Diagnosis not present

## 2019-01-27 DIAGNOSIS — G8929 Other chronic pain: Secondary | ICD-10-CM | POA: Diagnosis not present

## 2019-01-27 DIAGNOSIS — Z7951 Long term (current) use of inhaled steroids: Secondary | ICD-10-CM | POA: Diagnosis not present

## 2019-01-27 DIAGNOSIS — K219 Gastro-esophageal reflux disease without esophagitis: Secondary | ICD-10-CM | POA: Diagnosis not present

## 2019-01-27 DIAGNOSIS — J309 Allergic rhinitis, unspecified: Secondary | ICD-10-CM | POA: Diagnosis not present

## 2019-01-27 DIAGNOSIS — J449 Chronic obstructive pulmonary disease, unspecified: Secondary | ICD-10-CM | POA: Diagnosis not present

## 2019-01-27 DIAGNOSIS — R69 Illness, unspecified: Secondary | ICD-10-CM | POA: Diagnosis not present

## 2019-01-27 DIAGNOSIS — G2581 Restless legs syndrome: Secondary | ICD-10-CM | POA: Diagnosis not present

## 2019-01-27 DIAGNOSIS — M1990 Unspecified osteoarthritis, unspecified site: Secondary | ICD-10-CM | POA: Diagnosis not present

## 2019-02-05 ENCOUNTER — Other Ambulatory Visit: Payer: Self-pay | Admitting: Family Medicine

## 2019-02-05 DIAGNOSIS — E2839 Other primary ovarian failure: Secondary | ICD-10-CM

## 2019-02-12 DIAGNOSIS — M25562 Pain in left knee: Secondary | ICD-10-CM | POA: Diagnosis not present

## 2019-02-12 DIAGNOSIS — G8929 Other chronic pain: Secondary | ICD-10-CM | POA: Diagnosis not present

## 2019-04-22 ENCOUNTER — Other Ambulatory Visit: Payer: Self-pay

## 2019-04-22 ENCOUNTER — Ambulatory Visit
Admission: RE | Admit: 2019-04-22 | Discharge: 2019-04-22 | Disposition: A | Discharge status: Home / Self Care | Payer: Medicare HMO | Source: Ambulatory Visit | Attending: Family Medicine | Admitting: Family Medicine

## 2019-04-22 DIAGNOSIS — M8589 Other specified disorders of bone density and structure, multiple sites: Secondary | ICD-10-CM | POA: Diagnosis not present

## 2019-04-22 DIAGNOSIS — E2839 Other primary ovarian failure: Secondary | ICD-10-CM

## 2019-04-22 DIAGNOSIS — Z780 Asymptomatic menopausal state: Secondary | ICD-10-CM | POA: Diagnosis not present

## 2019-04-26 DIAGNOSIS — R69 Illness, unspecified: Secondary | ICD-10-CM | POA: Diagnosis not present

## 2019-05-03 ENCOUNTER — Telehealth: Payer: Self-pay | Admitting: Surgery

## 2019-05-03 NOTE — Telephone Encounter (Signed)
Received call from [facility] concerning 7/9 ED visit, clarification provided. No further question or concerns noted.

## 2019-07-24 DIAGNOSIS — R69 Illness, unspecified: Secondary | ICD-10-CM | POA: Diagnosis not present

## 2019-07-24 DIAGNOSIS — F331 Major depressive disorder, recurrent, moderate: Secondary | ICD-10-CM | POA: Diagnosis not present

## 2019-07-24 DIAGNOSIS — J449 Chronic obstructive pulmonary disease, unspecified: Secondary | ICD-10-CM | POA: Diagnosis not present

## 2019-08-29 DIAGNOSIS — R69 Illness, unspecified: Secondary | ICD-10-CM | POA: Diagnosis not present

## 2019-08-29 DIAGNOSIS — J449 Chronic obstructive pulmonary disease, unspecified: Secondary | ICD-10-CM | POA: Diagnosis not present

## 2019-09-02 DIAGNOSIS — G2581 Restless legs syndrome: Secondary | ICD-10-CM | POA: Diagnosis not present

## 2019-09-02 DIAGNOSIS — Z79899 Other long term (current) drug therapy: Secondary | ICD-10-CM | POA: Diagnosis not present

## 2019-09-02 DIAGNOSIS — E559 Vitamin D deficiency, unspecified: Secondary | ICD-10-CM | POA: Diagnosis not present

## 2019-09-02 DIAGNOSIS — Z136 Encounter for screening for cardiovascular disorders: Secondary | ICD-10-CM | POA: Diagnosis not present

## 2019-09-03 ENCOUNTER — Other Ambulatory Visit: Payer: Self-pay | Admitting: Family Medicine

## 2019-09-03 DIAGNOSIS — Z1231 Encounter for screening mammogram for malignant neoplasm of breast: Secondary | ICD-10-CM

## 2019-10-03 ENCOUNTER — Ambulatory Visit
Admission: RE | Admit: 2019-10-03 | Discharge: 2019-10-03 | Disposition: A | Discharge status: Home / Self Care | Payer: Medicare HMO | Source: Ambulatory Visit | Attending: Family Medicine | Admitting: Family Medicine

## 2019-10-03 ENCOUNTER — Other Ambulatory Visit: Payer: Self-pay

## 2019-10-03 DIAGNOSIS — Z1231 Encounter for screening mammogram for malignant neoplasm of breast: Secondary | ICD-10-CM | POA: Diagnosis not present

## 2019-11-27 DIAGNOSIS — R69 Illness, unspecified: Secondary | ICD-10-CM | POA: Diagnosis not present

## 2019-11-27 DIAGNOSIS — E782 Mixed hyperlipidemia: Secondary | ICD-10-CM | POA: Diagnosis not present

## 2019-11-27 DIAGNOSIS — J449 Chronic obstructive pulmonary disease, unspecified: Secondary | ICD-10-CM | POA: Diagnosis not present

## 2019-12-12 DIAGNOSIS — Z79899 Other long term (current) drug therapy: Secondary | ICD-10-CM | POA: Diagnosis not present

## 2019-12-12 DIAGNOSIS — R7303 Prediabetes: Secondary | ICD-10-CM | POA: Diagnosis not present

## 2019-12-12 DIAGNOSIS — R790 Abnormal level of blood mineral: Secondary | ICD-10-CM | POA: Diagnosis not present

## 2019-12-12 DIAGNOSIS — E782 Mixed hyperlipidemia: Secondary | ICD-10-CM | POA: Diagnosis not present

## 2019-12-12 DIAGNOSIS — E559 Vitamin D deficiency, unspecified: Secondary | ICD-10-CM | POA: Diagnosis not present

## 2019-12-19 DIAGNOSIS — E782 Mixed hyperlipidemia: Secondary | ICD-10-CM | POA: Diagnosis not present

## 2019-12-19 DIAGNOSIS — J449 Chronic obstructive pulmonary disease, unspecified: Secondary | ICD-10-CM | POA: Diagnosis not present

## 2019-12-19 DIAGNOSIS — R69 Illness, unspecified: Secondary | ICD-10-CM | POA: Diagnosis not present

## 2020-01-06 DIAGNOSIS — M542 Cervicalgia: Secondary | ICD-10-CM | POA: Diagnosis not present

## 2020-01-06 DIAGNOSIS — M25511 Pain in right shoulder: Secondary | ICD-10-CM | POA: Diagnosis not present

## 2020-01-06 DIAGNOSIS — M25512 Pain in left shoulder: Secondary | ICD-10-CM | POA: Diagnosis not present

## 2020-01-15 DIAGNOSIS — R7303 Prediabetes: Secondary | ICD-10-CM | POA: Diagnosis not present

## 2020-01-15 DIAGNOSIS — Z6830 Body mass index (BMI) 30.0-30.9, adult: Secondary | ICD-10-CM | POA: Diagnosis not present

## 2020-01-15 DIAGNOSIS — R69 Illness, unspecified: Secondary | ICD-10-CM | POA: Diagnosis not present

## 2020-01-15 DIAGNOSIS — E782 Mixed hyperlipidemia: Secondary | ICD-10-CM | POA: Diagnosis not present

## 2020-02-09 DIAGNOSIS — R69 Illness, unspecified: Secondary | ICD-10-CM | POA: Diagnosis not present

## 2020-02-09 DIAGNOSIS — Z6830 Body mass index (BMI) 30.0-30.9, adult: Secondary | ICD-10-CM | POA: Diagnosis not present

## 2020-03-16 DIAGNOSIS — R69 Illness, unspecified: Secondary | ICD-10-CM | POA: Diagnosis not present

## 2020-03-16 DIAGNOSIS — Z23 Encounter for immunization: Secondary | ICD-10-CM | POA: Diagnosis not present

## 2020-03-16 DIAGNOSIS — Z6831 Body mass index (BMI) 31.0-31.9, adult: Secondary | ICD-10-CM | POA: Diagnosis not present

## 2020-03-16 DIAGNOSIS — M8580 Other specified disorders of bone density and structure, unspecified site: Secondary | ICD-10-CM | POA: Diagnosis not present

## 2020-04-14 DIAGNOSIS — Z6831 Body mass index (BMI) 31.0-31.9, adult: Secondary | ICD-10-CM | POA: Diagnosis not present

## 2020-04-14 DIAGNOSIS — R41840 Attention and concentration deficit: Secondary | ICD-10-CM | POA: Diagnosis not present

## 2020-04-14 DIAGNOSIS — R69 Illness, unspecified: Secondary | ICD-10-CM | POA: Diagnosis not present

## 2020-05-19 DIAGNOSIS — Z03818 Encounter for observation for suspected exposure to other biological agents ruled out: Secondary | ICD-10-CM | POA: Diagnosis not present

## 2020-05-19 DIAGNOSIS — R41840 Attention and concentration deficit: Secondary | ICD-10-CM | POA: Diagnosis not present

## 2020-05-19 DIAGNOSIS — R0981 Nasal congestion: Secondary | ICD-10-CM | POA: Diagnosis not present

## 2020-05-19 DIAGNOSIS — I700 Atherosclerosis of aorta: Secondary | ICD-10-CM | POA: Diagnosis not present

## 2020-05-19 DIAGNOSIS — R69 Illness, unspecified: Secondary | ICD-10-CM | POA: Diagnosis not present

## 2020-05-24 DIAGNOSIS — J449 Chronic obstructive pulmonary disease, unspecified: Secondary | ICD-10-CM | POA: Diagnosis not present

## 2020-05-24 DIAGNOSIS — Z03818 Encounter for observation for suspected exposure to other biological agents ruled out: Secondary | ICD-10-CM | POA: Diagnosis not present

## 2020-05-24 DIAGNOSIS — J22 Unspecified acute lower respiratory infection: Secondary | ICD-10-CM | POA: Diagnosis not present

## 2020-05-24 DIAGNOSIS — R432 Parageusia: Secondary | ICD-10-CM | POA: Diagnosis not present

## 2020-05-24 DIAGNOSIS — R430 Anosmia: Secondary | ICD-10-CM | POA: Diagnosis not present

## 2020-05-31 DIAGNOSIS — R9389 Abnormal findings on diagnostic imaging of other specified body structures: Secondary | ICD-10-CM | POA: Diagnosis not present

## 2020-05-31 DIAGNOSIS — G4483 Primary cough headache: Secondary | ICD-10-CM | POA: Diagnosis not present

## 2020-05-31 DIAGNOSIS — Z87891 Personal history of nicotine dependence: Secondary | ICD-10-CM | POA: Diagnosis not present

## 2020-06-01 ENCOUNTER — Other Ambulatory Visit: Payer: Self-pay | Admitting: Family Medicine

## 2020-06-01 ENCOUNTER — Ambulatory Visit (HOSPITAL_COMMUNITY)
Admission: RE | Admit: 2020-06-01 | Discharge: 2020-06-01 | Disposition: A | Discharge status: Home / Self Care | Payer: Medicare HMO | Source: Ambulatory Visit | Attending: Family Medicine | Admitting: Family Medicine

## 2020-06-01 ENCOUNTER — Other Ambulatory Visit: Payer: Self-pay

## 2020-06-01 DIAGNOSIS — G4483 Primary cough headache: Secondary | ICD-10-CM | POA: Diagnosis not present

## 2020-06-01 DIAGNOSIS — R519 Headache, unspecified: Secondary | ICD-10-CM | POA: Diagnosis not present

## 2020-08-10 DIAGNOSIS — R41840 Attention and concentration deficit: Secondary | ICD-10-CM | POA: Diagnosis not present

## 2020-08-10 DIAGNOSIS — R69 Illness, unspecified: Secondary | ICD-10-CM | POA: Diagnosis not present

## 2020-08-10 DIAGNOSIS — E663 Overweight: Secondary | ICD-10-CM | POA: Diagnosis not present

## 2020-08-11 ENCOUNTER — Encounter: Payer: Self-pay | Admitting: Pulmonary Disease

## 2020-08-11 ENCOUNTER — Other Ambulatory Visit (HOSPITAL_COMMUNITY): Payer: Self-pay

## 2020-08-11 ENCOUNTER — Ambulatory Visit (INDEPENDENT_AMBULATORY_CARE_PROVIDER_SITE_OTHER): Payer: Medicare HMO | Admitting: Pulmonary Disease

## 2020-08-11 ENCOUNTER — Other Ambulatory Visit: Payer: Self-pay

## 2020-08-11 VITALS — BP 120/78 | HR 90 | Temp 98.3°F | Ht 66.0 in | Wt 168.0 lb

## 2020-08-11 DIAGNOSIS — R0981 Nasal congestion: Secondary | ICD-10-CM

## 2020-08-11 DIAGNOSIS — R059 Cough, unspecified: Secondary | ICD-10-CM

## 2020-08-11 DIAGNOSIS — J449 Chronic obstructive pulmonary disease, unspecified: Secondary | ICD-10-CM | POA: Diagnosis not present

## 2020-08-11 DIAGNOSIS — R1310 Dysphagia, unspecified: Secondary | ICD-10-CM

## 2020-08-11 NOTE — Patient Instructions (Addendum)
We will schedule you for barium swallow with speech therapy  We will schedule you for CT chest scan in July and a followed up visit soon after the chest scan  Continue on current inhaler therapy.

## 2020-08-11 NOTE — Progress Notes (Signed)
Synopsis: Referred in January 2022 for abnormal chest radiograph  Subjective:   PATIENT ID: Lindsay Porter GENDER: female DOB: 1941/01/15, MRN: [id number]   HPI  Chief Complaint  Patient presents with  . consult    Pt reports feeling at baseline and with "as usual with COPD". States in office today to discuss lung imaging findings   Lindsay Porter is a 80 year old woman, former smoker with COPD who is referred back to pulmonary clinic for abnormal chest imaging.   She was previously followed by Dr. Lake Bells. She remains on symbicort 80-4.68mcg 2 puffs twice daily with no issues reported in her breathing. She does complain of cough, sinus congestion and runny nose.    She complains of dysphagia when eating solid foods where the food gets stuck in her throat.   She had a chest radiograph done 05/31/20 to follow up a left lower lobe and lingular lesion noted on a chest radiograph from 01/08/2019 after a fall. There was no change in the left lower lobe or lingular airspace opacity.   Past Medical History:  Diagnosis Date  . Alcohol abuse   . Anemia, iron deficiency   . Arthritis   . Carpal tunnel syndrome   . COPD (chronic obstructive pulmonary disease) ([place])   . Depression   . Diverticulosis   . GERD (gastroesophageal reflux disease)   . Hiatal hernia   . Hyperlipidemia   . Insomnia   . Osteopenia   . Ovarian cyst   . Restless leg syndrome   . Seasonal allergies   . Vitamin D deficiency   . Wears contact lenses      Family History  Problem Relation Age of Onset  . Rheum arthritis Mother   . Asthma Mother   . Arthritis Sister      Social History   Socioeconomic History  . Marital status: Married    Spouse name: Not on file  . Number of children: Not on file  . Years of education: Not on file  . Highest education level: Not on file  Occupational History  . Occupation: RETIRED  Tobacco Use  . Smoking status: Former Smoker    Packs/day: 2.00    Years:  45.00    Pack years: 90.00    Types: Cigarettes    Quit date: 10/02/2007    Years since quitting: 12.8  . Smokeless tobacco: Never Used  Substance and Sexual Activity  . Alcohol use: No    Comment: quit in 2011  . Drug use: No  . Sexual activity: Not on file  Other Topics Concern  . Not on file  Social History Narrative  . Not on file   Social Determinants of Health   Financial Resource Strain: Not on file  Food Insecurity: Not on file  Transportation Needs: Not on file  Physical Activity: Not on file  Stress: Not on file  Social Connections: Not on file  Intimate Partner Violence: Not on file     Allergies  Allergen Reactions  . Singulair [Montelukast Sodium] Hives  . Sulfa Antibiotics Other (See Comments)    Childhood reaction      Outpatient Medications Prior to Visit  Medication Sig Dispense Refill  . amphetamine-dextroamphetamine (ADDERALL) 20 MG tablet Take 20 mg by mouth daily.    Marland Kitchen EPINEPHrine (EPI-PEN) 0.3 mg/0.3 mL DEVI Inject 0.3 mg into the muscle as needed (for allergic reaction).     . gabapentin (NEURONTIN) 300 MG capsule Take 1 capsule (300 mg total)  by mouth at bedtime. 30 capsule 0  . lidocaine (XYLOCAINE) 2 % solution Use as directed 15 mLs in the mouth or throat as needed for mouth pain. 100 mL 0  . loratadine (CLARITIN) 10 MG tablet Take 10 mg by mouth daily.    . meloxicam (MOBIC) 7.5 MG tablet Take 1 tablet (7.5 mg total) by mouth daily. 30 tablet 3  . pramipexole (MIRAPEX) 0.5 MG tablet Take 0.5 mg by mouth 2 (two) times a day.     . sertraline (ZOLOFT) 25 MG tablet Take 25 mg by mouth daily.    Marland Kitchen Spacer/Aero-Holding Chambers (AEROCHAMBER MV) inhaler Use as instructed 1 each 0  . SYMBICORT 80-4.5 MCG/ACT inhaler INHALE 2 PUFFS TWICE A DAY (Patient taking differently: Inhale 2 puffs into the lungs 2 (two) times a day.) 10.2 g 5  . VENTOLIN HFA 108 (90 Base) MCG/ACT inhaler INHALE 2 PUFFS INTO THE LUNGS EVERY 6 HOURS AS NEEDED FOR WHEEZING OR  SHORTNESS OF BREATH (Patient taking differently: Inhale 2 puffs into the lungs every 6 (six) hours as needed for wheezing.) 18 Inhaler 5  . cetirizine (ZYRTEC) 10 MG tablet Take 1 tablet (10 mg total) by mouth daily. (Patient not taking: Reported on 08/11/2020) 30 tablet 0  . cyclobenzaprine (FLEXERIL) 5 MG tablet Take 1 tablet (5 mg total) by mouth 2 (two) times daily as needed for muscle spasms. (Patient not taking: Reported on 08/11/2020) 20 tablet 0  . HYDROcodone-acetaminophen (NORCO/VICODIN) 5-325 MG tablet Take 1 tablet by mouth every 4 (four) hours as needed. (Patient not taking: Reported on 08/11/2020) 10 tablet 0   No facility-administered medications prior to visit.    Review of Systems  Constitutional: Negative for chills, fever, malaise/fatigue and weight loss.  HENT: Positive for congestion. Negative for sinus pain and sore throat.   Eyes: Negative.   Respiratory: Positive for cough. Negative for hemoptysis, sputum production, shortness of breath and wheezing.   Cardiovascular: Negative for chest pain, palpitations, orthopnea, claudication and leg swelling.  Gastrointestinal: Negative for abdominal pain, heartburn, nausea and vomiting.  Genitourinary: Negative.   Musculoskeletal: Negative for joint pain and myalgias.  Skin: Negative for rash.  Neurological: Negative for weakness.  Endo/Heme/Allergies: Negative.   Psychiatric/Behavioral: Negative.     Objective:   Vitals:   08/11/20 1053  BP: 120/78  Pulse: 90  Temp: 98.3 F (36.8 C)  SpO2: 94%  Weight: 168 lb (76.2 kg)  Height: 5\' 6"  (1.676 m)     Physical Exam Constitutional:      General: She is not in acute distress.    Appearance: Normal appearance. She is not ill-appearing.  HENT:     Head: Normocephalic and atraumatic.  Eyes:     General: No scleral icterus.    Conjunctiva/sclera: Conjunctivae normal.     Pupils: Pupils are equal, round, and reactive to light.  Cardiovascular:     Rate and Rhythm: Normal  rate and regular rhythm.     Pulses: Normal pulses.     Heart sounds: Normal heart sounds. No murmur heard.   Pulmonary:     Effort: Pulmonary effort is normal.     Breath sounds: Normal breath sounds. No wheezing, rhonchi or rales.  Abdominal:     General: Bowel sounds are normal.     Palpations: Abdomen is soft.  Musculoskeletal:     Right lower leg: No edema.     Left lower leg: No edema.  Lymphadenopathy:     Cervical: No cervical adenopathy.  Skin:    General: Skin is warm and dry.  Neurological:     General: No focal deficit present.     Mental Status: She is alert.  Psychiatric:        Mood and Affect: Mood normal.        Behavior: Behavior normal.        Thought Content: Thought content normal.        Judgment: Judgment normal.     CBC    Component Value Date/Time   WBC 7.0 01/09/2019 0424   RBC 4.50 01/09/2019 0424   HGB 13.8 01/09/2019 0424   HCT 43.3 01/09/2019 0424   PLT 184 01/09/2019 0424   MCV 96.2 01/09/2019 0424   MCH 30.7 01/09/2019 0424   MCHC 31.9 01/09/2019 0424   RDW 13.3 01/09/2019 0424   LYMPHSABS 1,624 10/28/2015 1234   MONOABS 336 10/28/2015 1234   EOSABS 56 10/28/2015 1234   BASOSABS 56 10/28/2015 1234   Chest imaging: CXR 05/31/20 1. Chronic airspace opacity along the left hemidiaphragm, favoring lingular and left lower lobe scarring. Appearance not appreciably changed from 01/08/2019. 2. Degenerative glenohumeral arthropathy, left greater than right. 3. Aortic Atherosclerosis (ICD10-I70.0).  CT Chest 01/09/2019 Mediastinum/Nodes: Moderate hiatal hernia. No adenopathy. Thyroid gland and trachea appear normal.  Lungs/Pleura: There is linear atelectasis at both lung bases primarily posteriorly. No mass lesions or infiltrates or effusions.  PFT: No flowsheet data found.   Assessment & Plan:   Chronic obstructive pulmonary disease, unspecified COPD type ([place])  Sinus congestion  Dysphagia, unspecified type - Plan: SLP  modified barium swallow  Cough - Plan: CT Chest Wo Contrast  Discussion: Chavie Kolinski is a 80 year old woman, former smoker with COPD who is referred back to pulmonary clinic for abnormal chest imaging.   Her COPD is well controlled at this time. We will continue symbicort therapy for now. Will consider transitioning her to LAMA therapy in the future.   We will check a chest CT scan to monitor the left lower lobe or lingular opacity.   For the dysphagia, we will check a modified barium swallow and await speech therapy's recommendations.   Follow up in July 2022  Freda Jackson, MD [facility] Office: [phone]   See Amion for Pager Details     Current Outpatient Medications:  .  amphetamine-dextroamphetamine (ADDERALL) 20 MG tablet, Take 20 mg by mouth daily., Disp: , Rfl:  .  EPINEPHrine (EPI-PEN) 0.3 mg/0.3 mL DEVI, Inject 0.3 mg into the muscle as needed (for allergic reaction). , Disp: , Rfl:  .  gabapentin (NEURONTIN) 300 MG capsule, Take 1 capsule (300 mg total) by mouth at bedtime., Disp: 30 capsule, Rfl: 0 .  lidocaine (XYLOCAINE) 2 % solution, Use as directed 15 mLs in the mouth or throat as needed for mouth pain., Disp: 100 mL, Rfl: 0 .  loratadine (CLARITIN) 10 MG tablet, Take 10 mg by mouth daily., Disp: , Rfl:  .  meloxicam (MOBIC) 7.5 MG tablet, Take 1 tablet (7.5 mg total) by mouth daily., Disp: 30 tablet, Rfl: 3 .  pramipexole (MIRAPEX) 0.5 MG tablet, Take 0.5 mg by mouth 2 (two) times a day. , Disp: , Rfl:  .  sertraline (ZOLOFT) 25 MG tablet, Take 25 mg by mouth daily., Disp: , Rfl:  .  Spacer/Aero-Holding Chambers (AEROCHAMBER MV) inhaler, Use as instructed, Disp: 1 each, Rfl: 0 .  SYMBICORT 80-4.5 MCG/ACT inhaler, INHALE 2 PUFFS TWICE A DAY (Patient taking differently: Inhale  2 puffs into the lungs 2 (two) times a day.), Disp: 10.2 g, Rfl: 5 .  VENTOLIN HFA 108 (90 Base) MCG/ACT inhaler, INHALE 2 PUFFS INTO THE LUNGS EVERY 6 HOURS AS  NEEDED FOR WHEEZING OR SHORTNESS OF BREATH (Patient taking differently: Inhale 2 puffs into the lungs every 6 (six) hours as needed for wheezing.), Disp: 18 Inhaler, Rfl: 5 .  cetirizine (ZYRTEC) 10 MG tablet, Take 1 tablet (10 mg total) by mouth daily. (Patient not taking: Reported on 08/11/2020), Disp: 30 tablet, Rfl: 0 .  cyclobenzaprine (FLEXERIL) 5 MG tablet, Take 1 tablet (5 mg total) by mouth 2 (two) times daily as needed for muscle spasms. (Patient not taking: Reported on 08/11/2020), Disp: 20 tablet, Rfl: 0 .  HYDROcodone-acetaminophen (NORCO/VICODIN) 5-325 MG tablet, Take 1 tablet by mouth every 4 (four) hours as needed. (Patient not taking: Reported on 08/11/2020), Disp: 10 tablet, Rfl: 0

## 2020-08-17 ENCOUNTER — Ambulatory Visit (HOSPITAL_COMMUNITY)
Admission: RE | Admit: 2020-08-17 | Discharge: 2020-08-17 | Disposition: A | Discharge status: Home / Self Care | Payer: Medicare HMO | Source: Ambulatory Visit | Attending: Pulmonary Disease | Admitting: Pulmonary Disease

## 2020-08-17 ENCOUNTER — Other Ambulatory Visit: Payer: Self-pay

## 2020-08-17 DIAGNOSIS — Z0389 Encounter for observation for other suspected diseases and conditions ruled out: Secondary | ICD-10-CM | POA: Diagnosis not present

## 2020-08-17 DIAGNOSIS — R1310 Dysphagia, unspecified: Secondary | ICD-10-CM | POA: Insufficient documentation

## 2020-08-23 ENCOUNTER — Telehealth: Payer: Self-pay | Admitting: Pulmonary Disease

## 2020-08-23 DIAGNOSIS — K224 Dyskinesia of esophagus: Secondary | ICD-10-CM

## 2020-08-23 NOTE — Telephone Encounter (Signed)
Called and spoke with pt who stated she had a missed call from [place]. Stated to pt that it was probably about getting her CT scheduled. Stated to pt that I was going to send this to [place] and she would give her a call back. Pt verbalized understanding.  Routing to [place].

## 2020-08-24 NOTE — Telephone Encounter (Signed)
Pt is returning Cherina's call regarding results of modified barium swallow.

## 2020-08-24 NOTE — Telephone Encounter (Signed)
Please refer patient to GI for further esophageal dysmotility issues based on the modified barium swallow and let the patient know we will be placing this referral. Thanks.   I have called and spoke with pt and she is aware of referral that has been placed.

## 2020-08-25 DIAGNOSIS — Z0000 Encounter for general adult medical examination without abnormal findings: Secondary | ICD-10-CM | POA: Diagnosis not present

## 2020-08-31 ENCOUNTER — Other Ambulatory Visit: Payer: Self-pay | Admitting: Family Medicine

## 2020-08-31 DIAGNOSIS — Z1231 Encounter for screening mammogram for malignant neoplasm of breast: Secondary | ICD-10-CM

## 2020-09-07 ENCOUNTER — Ambulatory Visit (INDEPENDENT_AMBULATORY_CARE_PROVIDER_SITE_OTHER): Payer: Medicare HMO

## 2020-09-07 ENCOUNTER — Ambulatory Visit (INDEPENDENT_AMBULATORY_CARE_PROVIDER_SITE_OTHER): Payer: Medicare HMO | Admitting: Podiatry

## 2020-09-07 ENCOUNTER — Other Ambulatory Visit: Payer: Self-pay

## 2020-09-07 DIAGNOSIS — M19079 Primary osteoarthritis, unspecified ankle and foot: Secondary | ICD-10-CM

## 2020-09-07 DIAGNOSIS — M2011 Hallux valgus (acquired), right foot: Secondary | ICD-10-CM | POA: Diagnosis not present

## 2020-09-07 DIAGNOSIS — M79672 Pain in left foot: Secondary | ICD-10-CM | POA: Diagnosis not present

## 2020-09-07 DIAGNOSIS — M2012 Hallux valgus (acquired), left foot: Secondary | ICD-10-CM

## 2020-09-07 DIAGNOSIS — M79671 Pain in right foot: Secondary | ICD-10-CM

## 2020-09-07 DIAGNOSIS — M25571 Pain in right ankle and joints of right foot: Secondary | ICD-10-CM

## 2020-09-07 DIAGNOSIS — M2041 Other hammer toe(s) (acquired), right foot: Secondary | ICD-10-CM | POA: Diagnosis not present

## 2020-09-07 DIAGNOSIS — M21962 Unspecified acquired deformity of left lower leg: Secondary | ICD-10-CM

## 2020-09-07 DIAGNOSIS — M2042 Other hammer toe(s) (acquired), left foot: Secondary | ICD-10-CM

## 2020-09-09 ENCOUNTER — Telehealth: Payer: Self-pay | Admitting: Pulmonary Disease

## 2020-09-09 NOTE — Telephone Encounter (Signed)
Yes, it is recommended by the speech therapy team that she see a GI specialist for this issue. She should keep the appointment.   Thanks, Wille Glaser

## 2020-09-09 NOTE — Telephone Encounter (Signed)
I called and spoke with pt and she is aware to keep the appt with GI and our office will reach out to her about the follow up CT scan per JD.

## 2020-09-09 NOTE — Telephone Encounter (Signed)
Pt has a pending appt on 04/20 with GI for her esophageal dysmotility issues based off of a modified barium swallow.  Pt is wanting to know if this appt is really needed?  Pt didn't want to see another doctor if it was not really needed.  JD please advise. Thanks

## 2020-10-07 DIAGNOSIS — M79645 Pain in left finger(s): Secondary | ICD-10-CM | POA: Diagnosis not present

## 2020-10-07 DIAGNOSIS — M79642 Pain in left hand: Secondary | ICD-10-CM | POA: Diagnosis not present

## 2020-10-07 DIAGNOSIS — M13842 Other specified arthritis, left hand: Secondary | ICD-10-CM | POA: Diagnosis not present

## 2020-10-12 ENCOUNTER — Ambulatory Visit (INDEPENDENT_AMBULATORY_CARE_PROVIDER_SITE_OTHER): Payer: Medicare HMO | Admitting: Podiatry

## 2020-10-12 ENCOUNTER — Other Ambulatory Visit: Payer: Self-pay

## 2020-10-12 DIAGNOSIS — M2041 Other hammer toe(s) (acquired), right foot: Secondary | ICD-10-CM

## 2020-10-12 DIAGNOSIS — M61562 Other ossification of muscle, left lower leg: Secondary | ICD-10-CM

## 2020-10-12 DIAGNOSIS — E663 Overweight: Secondary | ICD-10-CM | POA: Insufficient documentation

## 2020-10-12 DIAGNOSIS — J309 Allergic rhinitis, unspecified: Secondary | ICD-10-CM | POA: Insufficient documentation

## 2020-10-12 DIAGNOSIS — R9389 Abnormal findings on diagnostic imaging of other specified body structures: Secondary | ICD-10-CM | POA: Insufficient documentation

## 2020-10-12 DIAGNOSIS — J45901 Unspecified asthma with (acute) exacerbation: Secondary | ICD-10-CM | POA: Insufficient documentation

## 2020-10-12 DIAGNOSIS — M79672 Pain in left foot: Secondary | ICD-10-CM | POA: Diagnosis not present

## 2020-10-12 DIAGNOSIS — F331 Major depressive disorder, recurrent, moderate: Secondary | ICD-10-CM | POA: Insufficient documentation

## 2020-10-12 DIAGNOSIS — J4520 Mild intermittent asthma, uncomplicated: Secondary | ICD-10-CM | POA: Insufficient documentation

## 2020-10-12 DIAGNOSIS — M79671 Pain in right foot: Secondary | ICD-10-CM | POA: Diagnosis not present

## 2020-10-12 DIAGNOSIS — F320 Major depressive disorder, single episode, mild: Secondary | ICD-10-CM | POA: Insufficient documentation

## 2020-10-12 DIAGNOSIS — K449 Diaphragmatic hernia without obstruction or gangrene: Secondary | ICD-10-CM | POA: Insufficient documentation

## 2020-10-12 DIAGNOSIS — R432 Parageusia: Secondary | ICD-10-CM | POA: Insufficient documentation

## 2020-10-12 DIAGNOSIS — I700 Atherosclerosis of aorta: Secondary | ICD-10-CM | POA: Insufficient documentation

## 2020-10-12 DIAGNOSIS — M19079 Primary osteoarthritis, unspecified ankle and foot: Secondary | ICD-10-CM | POA: Diagnosis not present

## 2020-10-12 DIAGNOSIS — K319 Disease of stomach and duodenum, unspecified: Secondary | ICD-10-CM | POA: Insufficient documentation

## 2020-10-12 DIAGNOSIS — R430 Anosmia: Secondary | ICD-10-CM | POA: Insufficient documentation

## 2020-10-12 DIAGNOSIS — K4090 Unilateral inguinal hernia, without obstruction or gangrene, not specified as recurrent: Secondary | ICD-10-CM | POA: Insufficient documentation

## 2020-10-12 DIAGNOSIS — R7303 Prediabetes: Secondary | ICD-10-CM | POA: Insufficient documentation

## 2020-10-12 DIAGNOSIS — R490 Dysphonia: Secondary | ICD-10-CM | POA: Insufficient documentation

## 2020-10-12 DIAGNOSIS — K219 Gastro-esophageal reflux disease without esophagitis: Secondary | ICD-10-CM | POA: Insufficient documentation

## 2020-10-12 DIAGNOSIS — K591 Functional diarrhea: Secondary | ICD-10-CM | POA: Insufficient documentation

## 2020-10-12 DIAGNOSIS — R059 Cough, unspecified: Secondary | ICD-10-CM | POA: Insufficient documentation

## 2020-10-12 DIAGNOSIS — R41840 Attention and concentration deficit: Secondary | ICD-10-CM | POA: Insufficient documentation

## 2020-10-15 NOTE — Progress Notes (Signed)
  Subjective:  Patient ID: Lindsay Porter, female    DOB: Mar 20, 1941,  MRN: [id number]  Chief Complaint  Patient presents with  . Arthritis    Pt has arthritis in both feet    80 y.o. female presents with the above complaint. History confirmed with patient.  Reports pain around the left ankle.  Complains that there is a pain in the left foot reports biggest issues with the toes on the right foot and arthritis in both feet.  Objective:  Physical Exam: warm, good capillary refill, no trophic changes or ulcerative lesions, normal DP and PT pulses and normal sensory exam. Left Foot: Palpable painful mass of the distal medial left leg Right Foot: Pain palpation about the talonavicular joint, pain to palpation about the great toe and second toe.  Lesser digital contractures with prominent second metatarsal plantarly, hallux valgus  Assessment:   1. Arthritis of midfoot   2. Pain in both feet   3. Hammer toes of both feet   4. Acquired hallux valgus of both feet   5. Metatarsal deformity, left   6. Arthralgia of hindfoot, right      Plan:  Patient was evaluated and treated and all questions answered.  Talonavicular arthritis, right; hammertoes   -Injeciton delivered to the TNJ -Will likely need right 2nd HT, possible TN exostectomy, AKin, 2nd HT repair, 2nd met shortening osteotomy.  Patient would like to think this over.  Procedure: Joint Injection Location: Right TN joint Skin Prep: Alcohol. Injectate: 0.5 cc 1% lidocaine plain, 0.5 cc betamethasone acetate-betamethasone sodium phosphate Disposition: Patient tolerated procedure well. Injection site dressed with a band-aid.    Return in about 1 month (around 10/08/2020) for Bunion, hammertoe f/u, surgical planning visit; arthritis f/u.

## 2020-10-20 ENCOUNTER — Encounter: Payer: Self-pay | Admitting: Internal Medicine

## 2020-10-20 ENCOUNTER — Ambulatory Visit: Payer: Medicare HMO | Admitting: Internal Medicine

## 2020-10-20 ENCOUNTER — Other Ambulatory Visit: Payer: Self-pay

## 2020-10-20 VITALS — BP 122/72 | HR 73 | Ht 66.0 in | Wt 169.5 lb

## 2020-10-20 DIAGNOSIS — K219 Gastro-esophageal reflux disease without esophagitis: Secondary | ICD-10-CM | POA: Diagnosis not present

## 2020-10-20 DIAGNOSIS — R490 Dysphonia: Secondary | ICD-10-CM

## 2020-10-20 DIAGNOSIS — R1310 Dysphagia, unspecified: Secondary | ICD-10-CM | POA: Diagnosis not present

## 2020-10-20 DIAGNOSIS — Z8719 Personal history of other diseases of the digestive system: Secondary | ICD-10-CM | POA: Diagnosis not present

## 2020-10-20 DIAGNOSIS — R059 Cough, unspecified: Secondary | ICD-10-CM | POA: Diagnosis not present

## 2020-10-20 DIAGNOSIS — Z9889 Other specified postprocedural states: Secondary | ICD-10-CM | POA: Diagnosis not present

## 2020-10-20 MED ORDER — PANTOPRAZOLE SODIUM 40 MG PO TBEC: 40.0000 mg | DELAYED_RELEASE_TABLET | Freq: Every day | ORAL | 5 refills | Status: DC

## 2020-10-20 NOTE — Progress Notes (Signed)
HISTORY OF PRESENT ILLNESS:  Lindsay Porter is a 80 y.o. female with past medical history as listed below who was sent today by pulmonology regarding dysphagia.  Previous patient of Lindsay Porter (Lindsay Porter).  No outside records, but she apparently had prior colonoscopy and upper endoscopy.  Patient is accompanied by her husband.  Their daughter is in operating room nurse at [employer].  The patient tells me that she has had longstanding problems with dysphagia.  She underwent barium esophagram in July 2011 that began in May 2013.  Findings were as follows:  January 25, 2010: IMPRESSION:    1. Moderate sized hiatal hernia. A barium pill lodges temporarily  in the distal esophagus but does pass into the hernia without  significant delay.  2. Food debris does pass from the hernia into the more distal  stomach without evidence of significant obstruction.  3. Moderate tertiary contractions in the distal esophagus.  4. Slight narrowing of the lower cervical esophagus without  definite stricture.   Nov 21, 2011: IMPRESSION:   1. Significant esophageal dysmotility with esophageal spasm  distally with standing wave contractions.  2. Large hiatal hernia with organoaxial volvulus.  3. Prominent cricopharyngeal impression but no Zenker's  diverticulum.   She subsequently underwent repair of the hiatal hernia with wrap by Lindsay Porter October 2013.  Today she presents with a number of complaints.  She has had a somewhat raspy voice over the past year or more.  She does take omeprazole 20 mg daily.  Somewhat sporadic.  She also reports discomfort in her neck and coughing spells with swallowing.  Mostly in the morning.  Often with water.  She also has a sensation of dysphagia to more substantive foods in the proximal region of the esophagus.  She reports items such as tomatoes cause problems.  She was recently evaluated by pulmonary.  Sent to speech pathology for modified barium swallow.  No significant  oropharyngeal dysphagia.  Felt to have esophageal dysphagia for which Porter assessment was recommended.  REVIEW OF SYSTEMS:  All non-Porter ROS negative unless otherwise stated in the HPI except for arthritis  Past Medical History:  Diagnosis Date  . Alcohol abuse   . Anemia, iron deficiency   . Arthritis   . Carpal tunnel syndrome   . COPD (chronic obstructive pulmonary disease) ([place])   . Depression   . Diverticulosis   . GERD (gastroesophageal reflux disease)   . Hiatal hernia   . Hyperlipidemia   . Insomnia   . Osteopenia   . Ovarian cyst   . Restless leg syndrome   . Seasonal allergies   . Vitamin D deficiency   . Wears contact lenses     Past Surgical History:  Procedure Laterality Date  . ANKLE SURGERY  07/07/2009   left  . APPENDECTOMY    . BREAST LUMPECTOMY Left   . BUNIONECTOMY  2006   left  . CARPAL TUNNEL RELEASE Right   . COLONOSCOPY    . HIATAL HERNIA REPAIR  04/19/2012   Procedure: LAPAROSCOPIC REPAIR OF HIATAL HERNIA;  Surgeon: Lindsay Jolly, MD;  Location: [facility];  Service: General;  Laterality: N/A;  . INGUINAL HERNIA REPAIR Right 06/04/2014   Procedure: HERNIA REPAIR INGUINAL ADULT;  Surgeon: Lindsay Mesa, MD;  Location: [facility];  Service: General;  Laterality: Right;  . INSERTION OF MESH Right 06/04/2014   Procedure: INSERTION OF MESH;  Surgeon: Lindsay Mesa, MD;  Location: [place];  Service: General;  Laterality: Right;  . KNEE SURGERY  2005   lt  . nose surgery  12/2010  . [place]   right  . TONSILLECTOMY    . UPPER GASTROINTESTINAL ENDOSCOPY    . UPPER Porter ENDOSCOPY      Social History [name]  reports that she quit smoking about 13 years ago. Her smoking use included cigarettes. She has a 90.00 pack-year smoking history. She has never used smokeless tobacco. She reports previous alcohol use. She reports that she does not use drugs.  family history includes Alcoholism in her  father; Arthritis in her sister; Asthma in her mother; Rheum arthritis in her mother.  Allergies  Allergen Reactions  . Singulair [Montelukast Sodium] Hives  . Sulfa Antibiotics Other (See Comments)    Childhood reaction        PHYSICAL EXAMINATION: Vital signs: BP 122/72 (BP Location: Left Arm, Patient Position: Sitting, Cuff Size: Normal)   Pulse 73   Ht 5\' 6"  (1.676 m)   Wt 169 lb 8 oz (76.9 kg)   BMI 27.36 kg/m   Constitutional: generally well-appearing, no acute distress Psychiatric: alert and oriented x3, cooperative Eyes: extraocular movements intact, anicteric, conjunctiva pink Mouth: oral pharynx moist, no lesions Neck: supple no lymphadenopathy Cardiovascular: heart regular rate and rhythm, no murmur Lungs: clear to auscultation bilaterally Abdomen: soft, nontender, nondistended, no obvious ascites, no peritoneal signs, normal bowel sounds, no organomegaly Rectal: Omitted Extremities: no clubbing, cyanosis.  Trace lower extremity edema bilaterally Skin: no lesions on visible extremities Neuro: No focal deficits.  Cranial nerves intact  ASSESSMENT:  1.  Problems with raspy voice.  Question GERD 2.  Swallowing difficulty as described.  Question prominent cricopharyngeus.  Question development of Zenker's diverticulum.  Rule out esophageal stricture 3.  Status post hiatal hernia repair 4.  Prior Porter care with Lindsay Porter   PLAN:  1.  Prescribe pantoprazole 40 mg daily.  May use omeprazole 40 mg daily until finished.  Medication risks reviewed 2.  Schedule barium swallow with tablet 3.  Consider upper endoscopy with esophageal dilation based on the results of the swallowing study.The nature of the procedure, as well as the risks, benefits, and alternatives were carefully and thoroughly reviewed with the patient. Ample time for discussion and questions allowed. The patient understood, was satisfied, and agreed to proceed. Total time of 50 minutes was spent preparing to  see the patient, reviewing.  Tests, studies, x-rays, and operative reports.  Obtaining comprehensive history and performing comprehensive physical examination.  Counseling the patient and her husband regarding her above listed issues.  Ordering advanced radiology studies possible endoscopy.  Finally, documenting clinical information in the health record.

## 2020-10-20 NOTE — Patient Instructions (Signed)
Please double up on your omeprazole 20 mg capsules- take 2 capsules per day until out of your current medication, then begin the pantoprazole prescription we are sending to your pharmacy. Do NOT take both of these medications at the same time.  We have sent the following medications to your pharmacy for you to pick up at your convenience: Pantoprazole 40 mg once daily. (in place of omeprazole)  You have been scheduled for a Barium Esophogram at [facility] (1st floor of the hospital) on Wednesday, 10/27/20 at 10:30 am. Please arrive 15 minutes prior to your appointment for registration. Make certain not to have anything to eat or drink 3 hours prior to your test. If you need to reschedule for any reason, please contact radiology at [phone] to do so. __________________________________________________________________ A barium swallow is an examination that concentrates on views of the esophagus. This tends to be a double contrast exam (barium and two liquids which, when combined, create a gas to distend the wall of the oesophagus) or single contrast (non-ionic iodine based). The study is usually tailored to your symptoms so a good history is essential. Attention is paid during the study to the form, structure and configuration of the esophagus, looking for functional disorders (such as aspiration, dysphagia, achalasia, motility and reflux) EXAMINATION You may be asked to change into a gown, depending on the type of swallow being performed. A radiologist and radiographer will perform the procedure. The radiologist will advise you of the type of contrast selected for your procedure and direct you during the exam. You will be asked to stand, sit or lie in several different positions and to hold a small amount of fluid in your mouth before being asked to swallow while the imaging is performed .In some instances you may be asked to swallow barium coated marshmallows to assess the motility of a solid  food bolus. The exam can be recorded as a digital or video fluoroscopy procedure. POST PROCEDURE It will take 1-2 days for the barium to pass through your system. To facilitate this, it is important, unless otherwise directed, to increase your fluids for the next 24-48hrs and to resume your normal diet.  This test typically takes about 30 minutes to perform. ___________________________________________________________________  If you are age 78 or older, your body mass index should be between 23-30. Your Body mass index is 27.36 kg/m. If this is out of the aforementioned range listed, please consider follow up with your Primary Care Provider.  Due to recent changes in healthcare laws, you may see the results of your imaging and laboratory studies on MyChart before your provider has had a chance to review them.  We understand that in some cases there may be results that are confusing or concerning to you. Not all laboratory results come back in the same time frame and the provider may be waiting for multiple results in order to interpret others.  Please give [place] 48 hours in order for your provider to thoroughly review all the results before contacting the office for clarification of your results.

## 2020-10-25 ENCOUNTER — Ambulatory Visit: Payer: Medicare HMO

## 2020-10-27 ENCOUNTER — Other Ambulatory Visit: Payer: Self-pay

## 2020-10-27 ENCOUNTER — Ambulatory Visit (HOSPITAL_COMMUNITY)
Admission: RE | Admit: 2020-10-27 | Discharge: 2020-10-27 | Disposition: A | Discharge status: Home / Self Care | Payer: Medicare HMO | Source: Ambulatory Visit | Attending: Internal Medicine | Admitting: Internal Medicine

## 2020-10-27 DIAGNOSIS — R1310 Dysphagia, unspecified: Secondary | ICD-10-CM | POA: Insufficient documentation

## 2020-10-27 DIAGNOSIS — K224 Dyskinesia of esophagus: Secondary | ICD-10-CM | POA: Diagnosis not present

## 2020-10-27 DIAGNOSIS — K219 Gastro-esophageal reflux disease without esophagitis: Secondary | ICD-10-CM | POA: Diagnosis not present

## 2020-11-04 NOTE — Progress Notes (Signed)
  Subjective:  Patient ID: Lindsay Porter, female    DOB: Mar 31, 1941,  MRN: [id number]  Chief Complaint  Patient presents with  . Follow-up    1 mo f/u, if not same, doing better. Taking meloxicam for arthritis in hand, maybe helping w/ pain in foot.     80 y.o. female presents with the above complaint. History confirmed with patient.  Thinks that the foot is doing a little better  Objective:  Physical Exam: warm, good capillary refill, no trophic changes or ulcerative lesions, normal DP and PT pulses and normal sensory exam. Left Foot: Palpable painful mass of the distal medial left leg Right Foot: Pain palpation about the talonavicular joint, pain to palpation about the great toe and second toe.  Lesser digital contractures with prominent second metatarsal plantarly, hallux valgus  Assessment:   1. Primary osteoarthritis of talonavicular joint   2. Other ossification of muscle, left lower leg   3. Pain in both feet   4. Hammer toe of right foot      Plan:  Patient was evaluated and treated and all questions answered.  Talonavicular arthritis, right; hammertoes   -Dispensed silicone crest pad -Plan for: Removal of left Leg ossicle, right 2nd HT, possible TN exostectomy, AKin, 2nd HT repair, 2nd met shortening osteotomy.  We will discuss more next visit.  Return in about 6 weeks (around 11/23/2020) for Surgical planning visit right foot.

## 2020-11-22 DIAGNOSIS — M13841 Other specified arthritis, right hand: Secondary | ICD-10-CM | POA: Diagnosis not present

## 2020-11-22 DIAGNOSIS — M13849 Other specified arthritis, unspecified hand: Secondary | ICD-10-CM | POA: Diagnosis not present

## 2020-11-22 DIAGNOSIS — M79645 Pain in left finger(s): Secondary | ICD-10-CM | POA: Diagnosis not present

## 2020-11-23 ENCOUNTER — Other Ambulatory Visit: Payer: Self-pay

## 2020-11-23 ENCOUNTER — Ambulatory Visit (AMBULATORY_SURGERY_CENTER): Payer: Self-pay | Admitting: *Deleted

## 2020-11-23 VITALS — Ht 66.0 in | Wt 166.8 lb

## 2020-11-23 DIAGNOSIS — R1310 Dysphagia, unspecified: Secondary | ICD-10-CM

## 2020-11-23 NOTE — Progress Notes (Signed)
  No trouble with anesthesia, denies being told they were difficult to intubate, or hx/fam hx of malignant hyperthermia per pt   No egg or soy allergy  No home oxygen use   No medications for weight loss taken 

## 2020-11-30 ENCOUNTER — Other Ambulatory Visit: Payer: Self-pay

## 2020-11-30 ENCOUNTER — Ambulatory Visit: Payer: Medicare HMO | Admitting: Podiatry

## 2020-11-30 DIAGNOSIS — M79672 Pain in left foot: Secondary | ICD-10-CM | POA: Diagnosis not present

## 2020-11-30 DIAGNOSIS — M79671 Pain in right foot: Secondary | ICD-10-CM | POA: Diagnosis not present

## 2020-11-30 DIAGNOSIS — M2041 Other hammer toe(s) (acquired), right foot: Secondary | ICD-10-CM | POA: Diagnosis not present

## 2020-11-30 DIAGNOSIS — M61562 Other ossification of muscle, left lower leg: Secondary | ICD-10-CM | POA: Diagnosis not present

## 2020-11-30 DIAGNOSIS — M19071 Primary osteoarthritis, right ankle and foot: Secondary | ICD-10-CM

## 2020-11-30 DIAGNOSIS — M19079 Primary osteoarthritis, unspecified ankle and foot: Secondary | ICD-10-CM

## 2020-11-30 NOTE — Progress Notes (Signed)
  Subjective:  Patient ID: Lindsay Porter, female    DOB: 12/25/1940,  MRN: [id number]  Chief Complaint  Patient presents with  . foot care    Planning for surgery   80 y.o. female presents with the above complaint. History confirmed with patient. Here to discuss surgery for her right foot.  Objective:  Physical Exam: warm, good capillary refill, no trophic changes or ulcerative lesions, normal DP and PT pulses and normal sensory exam. Left Foot: Palpable painful mass of the distal medial left leg Right Foot: Pain palpation about the talonavicular joint, pain to palpation about the great toe and second toe.  Lesser digital contractures with prominent second metatarsal plantarly, hallux valgus  Assessment:   1. Primary osteoarthritis of talonavicular joint   2. Other ossification of muscle, left lower leg   3. Pain in both feet   4. Hammer toe of right foot   5. Arthritis of midfoot    Plan:  Patient was evaluated and treated and all questions answered.  Talonavicular arthritis, right; hammertoes   -Patient has failed all conservative therapy and wishes to proceed with surgical intervention. All risks, benefits, and alternatives discussed with patient. No guarantees given. Consent reviewed and signed by patient. -Planned procedures: right removal of Leg ossicle, right 2nd HT, midfoot exostectomy medial and lateral including TN exostectomy, great toe Akin straightening ostoetomy, 2nd HT repair, 2nd met shortening osteotomy.   No follow-ups on file.

## 2020-12-03 ENCOUNTER — Telehealth: Payer: Self-pay | Admitting: Urology

## 2020-12-03 NOTE — Telephone Encounter (Signed)
DOS - 12/29/20  Barbie Banner OSTEOTOMY RIGHT --- 98102 METATARSAL OSTEOTOMY 2ND RIGHT --- 28308 TARSAL EXOSTECTOMY RIGHT X2 --- 54862 HAMMERTOE REPAIR 2ND RIGHT --- 82417 EXCISION OF BONE RIGHT --- 53010   [employer] EFFECTIVE DATE - 07/03/20   SPOKE WITH TONI AND STATED THAT FOR CPT CODES 40459, 13685, 99234 X'S 2, 14436 AND 01658 NO PRIOR AUTH IS REQUIRED.   REF # [id number]

## 2020-12-16 ENCOUNTER — Other Ambulatory Visit: Payer: Self-pay

## 2020-12-16 ENCOUNTER — Ambulatory Visit
Admission: RE | Admit: 2020-12-16 | Discharge: 2020-12-16 | Disposition: A | Discharge status: Home / Self Care | Payer: Medicare HMO | Source: Ambulatory Visit | Attending: Family Medicine | Admitting: Family Medicine

## 2020-12-16 ENCOUNTER — Ambulatory Visit: Payer: Medicare HMO

## 2020-12-16 DIAGNOSIS — Z1231 Encounter for screening mammogram for malignant neoplasm of breast: Secondary | ICD-10-CM

## 2020-12-22 ENCOUNTER — Ambulatory Visit: Payer: Medicare HMO | Admitting: Internal Medicine

## 2020-12-22 ENCOUNTER — Other Ambulatory Visit: Payer: Self-pay

## 2020-12-22 ENCOUNTER — Encounter: Payer: Self-pay | Admitting: Internal Medicine

## 2020-12-22 VITALS — BP 140/79 | HR 72 | Temp 98.4°F | Ht 66.0 in | Wt 166.0 lb

## 2020-12-22 MED ORDER — SODIUM CHLORIDE 0.9 % IV SOLN: 500.0000 mL | Freq: Once | INTRAVENOUS | Status: DC

## 2020-12-22 NOTE — Progress Notes (Signed)
Pt's states no medical or surgical changes since previsit or office visit.    Pt reported she ate breakfast today.  Had 2 pcs of link sausages, 1 scrambled egg, potatoes, coffee.  Finished breakfast at 10:00 am.  This was reported to Dr. Henrene Pastor and Earnestine Mealing, CRNA.  Santiago Glad came into admitting and explained to pt that the sausage, potatoes and eggs are too much and we need to cancel her procedure today.  Rescheduled EGD with propofol to tomorrow, 12/23/20 at 4:00 pm, pt tp arrive 3:00 pm.  Printed out her EGD instructions for pt. maw

## 2020-12-23 ENCOUNTER — Ambulatory Visit (AMBULATORY_SURGERY_CENTER): Payer: Medicare HMO | Admitting: Internal Medicine

## 2020-12-23 ENCOUNTER — Encounter: Payer: Self-pay | Admitting: Internal Medicine

## 2020-12-23 VITALS — BP 126/73 | HR 65 | Temp 97.6°F | Resp 18 | Ht 66.0 in | Wt 166.8 lb

## 2020-12-23 DIAGNOSIS — K222 Esophageal obstruction: Secondary | ICD-10-CM | POA: Diagnosis not present

## 2020-12-23 DIAGNOSIS — K219 Gastro-esophageal reflux disease without esophagitis: Secondary | ICD-10-CM | POA: Diagnosis not present

## 2020-12-23 DIAGNOSIS — R490 Dysphonia: Secondary | ICD-10-CM | POA: Diagnosis not present

## 2020-12-23 DIAGNOSIS — R059 Cough, unspecified: Secondary | ICD-10-CM

## 2020-12-23 DIAGNOSIS — R1310 Dysphagia, unspecified: Secondary | ICD-10-CM | POA: Diagnosis not present

## 2020-12-23 MED ORDER — SODIUM CHLORIDE 0.9 % IV SOLN: 500.0000 mL | Freq: Once | INTRAVENOUS | Status: DC

## 2020-12-23 MED ORDER — PANTOPRAZOLE SODIUM 40 MG PO TBEC: 40.0000 mg | DELAYED_RELEASE_TABLET | Freq: Two times a day (BID) | ORAL | 3 refills | Status: DC

## 2020-12-23 NOTE — Op Note (Signed)
[facility] Patient Name: Lindsay Porter Procedure Date: 12/23/2020 4:11 PM MRN: [id number] Endoscopist: Docia Chuck. Henrene Pastor , MD Age: 80 Referring MD:  Date of Birth: Sep 08, 1940 Gender: Female Account #: [id number] Procedure:                Upper GI endoscopy with biopsies; balloon dilation                            of the esophagus 18-20 mm Indications:              Dysphagia, Esophageal reflux, Abnormal                            cine-esophagram, Laryngitis (hoarseness) Medicines:                Monitored Anesthesia Care Procedure:                Pre-Anesthesia Assessment:                           - Prior to the procedure, a History and Physical                            was performed, and patient medications and                            allergies were reviewed. The patient's tolerance of                            previous anesthesia was also reviewed. The risks                            and benefits of the procedure and the sedation                            options and risks were discussed with the patient.                            All questions were answered, and informed consent                            was obtained. Prior Anticoagulants: The patient has                            taken no previous anticoagulant or antiplatelet                            agents. ASA Grade Assessment: II - A patient with                            mild systemic disease. After reviewing the risks                            and benefits, the patient was deemed in  satisfactory condition to undergo the procedure.                           After obtaining informed consent, the endoscope was                            passed under direct vision. Throughout the                            procedure, the patient's blood pressure, pulse, and                            oxygen saturations were monitored continuously. The                            GIF [id number]  #[id number] was introduced through the                            mouth, and advanced to the second part of duodenum.                            The upper GI endoscopy was accomplished without                            difficulty. The patient tolerated the procedure                            well. Scope In: Scope Out: Findings:                 The esophagus was slightly dilated and tortuous.                            There was a 15 mm ringlike stricture at the                            gastroesophageal junction (30 cm from the                            incisors). After completing the endoscopic survey,                            A TTS balloon was passed through the scope.                            Dilation with an 18-19-20 mm balloon dilator was                            performed to 20 mm. There was disruption of the                            ring with heme.                           The stomach revealed  moderate hiatal hernia with                            superficial Cameron erosions. There was patchy                            antral erythema. Biopsies were taken with a cold                            forceps for histology. Biopsies were taken with a                            cold forceps for Helicobacter pylori testing using                            CLOtest.                           The examined duodenum was normal.                           The cardia and gastric fundus were normal on                            retroflexion, save hiatal hernia Complications:            No immediate complications. Estimated Blood Loss:     Estimated blood loss: none. Impression:               1. Esophageal stricture status post dilation                           2. Moderate hiatal hernia with superficial Cameron                            erosions                           3. Antral erythema status post CLO biopsy                           4. GERD. Recommendation:           1. Post  dilation diet                           2. Increase pantoprazole to 40 mg twice daily                            (prescribed pantoprazole 40 mg twice daily; #60; 3                            refills). I would recommend taking this twice daily                            dosage for 3 months to see if this helps with  hoarseness                           3. If he have not seen an ear nose and throat                            specialist regarding hoarseness, please consider                            such. This should be directed by your PCP.                           4. GI follow-up with Dr. Henrene Pastor and about 2 months. Docia Chuck. Henrene Pastor, MD 12/23/2020 4:52:15 PM This report has been signed electronically.

## 2020-12-23 NOTE — Progress Notes (Signed)
History reviewed today 

## 2020-12-23 NOTE — Progress Notes (Signed)
PT taken to PACU. Monitors in place. VSS. Report given to RN. 

## 2020-12-23 NOTE — Progress Notes (Signed)
Called to room to assist during endoscopic procedure.  Patient ID and intended procedure confirmed with present staff. Received instructions for my participation in the procedure from the performing physician.  

## 2020-12-23 NOTE — Patient Instructions (Signed)
Please read handouts provided. Continue present medications. Await pathology results. Pantoprazole 40 mg twice daily for 3 months. Recommending patient to see an ear, nose , and throat specialist. Gi follow-up with Dr. Henrene Pastor in about 2 months.    YOU HAD AN ENDOSCOPIC PROCEDURE TODAY AT [facility]:   Refer to the procedure report that was given to you for any specific questions about what was found during the examination.  If the procedure report does not answer your questions, please call your gastroenterologist to clarify.  If you requested that your care partner not be given the details of your procedure findings, then the procedure report has been included in a sealed envelope for you to review at your convenience later.  YOU SHOULD EXPECT: Some feelings of bloating in the abdomen. Passage of more gas than usual.  Walking can help get rid of the air that was put into your GI tract during the procedure and reduce the bloating. If you had a lower endoscopy (such as a colonoscopy or flexible sigmoidoscopy) you may notice spotting of blood in your stool or on the toilet paper. If you underwent a bowel prep for your procedure, you may not have a normal bowel movement for a few days.  Please Note:  You might notice some irritation and congestion in your nose or some drainage.  This is from the oxygen used during your procedure.  There is no need for concern and it should clear up in a day or so.  SYMPTOMS TO REPORT IMMEDIATELY:   Following upper endoscopy (EGD)  Vomiting of blood or coffee ground material  New chest pain or pain under the shoulder blades  Painful or persistently difficult swallowing  New shortness of breath  Fever of 100F or higher  Black, tarry-looking stools  For urgent or emergent issues, a gastroenterologist can be reached at any hour by calling [phone]. Do not use MyChart messaging for urgent concerns.    DIET:   Drink plenty of fluids but  you should avoid alcoholic beverages for 24 hours.  ACTIVITY:  You should plan to take it easy for the rest of today and you should NOT DRIVE or use heavy machinery until tomorrow (because of the sedation medicines used during the test).    FOLLOW UP: Our staff will call the number listed on your records 48-72 hours following your procedure to check on you and address any questions or concerns that you may have regarding the information given to you following your procedure. If we do not reach you, we will leave a message.  We will attempt to reach you two times.  During this call, we will ask if you have developed any symptoms of COVID 19. If you develop any symptoms (ie: fever, flu-like symptoms, shortness of breath, cough etc.) before then, please call [phone].  If you test positive for Covid 19 in the 2 weeks post procedure, please call and report this information to [place].    If any biopsies were taken you will be contacted by phone or by letter within the next 1-3 weeks.  Please call us at [phone] if you have not heard about the biopsies in 3 weeks.    SIGNATURES/CONFIDENTIALITY: You and/or your care partner have signed paperwork which will be entered into your electronic medical record.  These signatures attest to the fact that that the information above on your After Visit Summary has been reviewed and is understood.  Full responsibility of the confidentiality  of this discharge information lies with you and/or your care-partner.  

## 2020-12-24 LAB — HELICOBACTER PYLORI SCREEN-BIOPSY: UREASE: NEGATIVE

## 2020-12-27 ENCOUNTER — Telehealth: Payer: Self-pay

## 2020-12-27 DIAGNOSIS — R41840 Attention and concentration deficit: Secondary | ICD-10-CM | POA: Diagnosis not present

## 2020-12-27 DIAGNOSIS — E782 Mixed hyperlipidemia: Secondary | ICD-10-CM | POA: Diagnosis not present

## 2020-12-27 DIAGNOSIS — G2581 Restless legs syndrome: Secondary | ICD-10-CM | POA: Diagnosis not present

## 2020-12-27 DIAGNOSIS — I700 Atherosclerosis of aorta: Secondary | ICD-10-CM | POA: Diagnosis not present

## 2020-12-27 DIAGNOSIS — R7303 Prediabetes: Secondary | ICD-10-CM | POA: Diagnosis not present

## 2020-12-27 DIAGNOSIS — G319 Degenerative disease of nervous system, unspecified: Secondary | ICD-10-CM | POA: Diagnosis not present

## 2020-12-27 DIAGNOSIS — J449 Chronic obstructive pulmonary disease, unspecified: Secondary | ICD-10-CM | POA: Diagnosis not present

## 2020-12-27 DIAGNOSIS — R69 Illness, unspecified: Secondary | ICD-10-CM | POA: Diagnosis not present

## 2020-12-27 DIAGNOSIS — M8580 Other specified disorders of bone density and structure, unspecified site: Secondary | ICD-10-CM | POA: Diagnosis not present

## 2020-12-27 NOTE — Telephone Encounter (Signed)
  Follow up Call-  Call back number 12/23/2020 12/22/2020  Post procedure Call Back phone  # [phone] cell  Permission to leave phone message Yes Yes  Some recent data might be hidden     Patient questions:  Do you have a fever, pain , or abdominal swelling? No. Pain Score  0 *  Have you tolerated food without any problems? Yes.    Have you been able to return to your normal activities? Yes.    Do you have any questions about your discharge instructions: Diet   No. Medications  No. Follow up visit  No.  Do you have questions or concerns about your Care? No.  Actions: * If pain score is 4 or above: No action needed, pain <4.  Have you developed a fever since your procedure? no  2.   Have you had an respiratory symptoms (SOB or cough) since your procedure? no  3.   Have you tested positive for COVID 19 since your procedure no  4.   Have you had any family members/close contacts diagnosed with the COVID 19 since your procedure?  no   If yes to any of these questions please route to Joylene John, RN and Joella Prince, RN

## 2020-12-29 ENCOUNTER — Other Ambulatory Visit: Payer: Self-pay | Admitting: Podiatry

## 2020-12-29 ENCOUNTER — Encounter: Payer: Self-pay | Admitting: Podiatry

## 2020-12-29 DIAGNOSIS — M85871 Other specified disorders of bone density and structure, right ankle and foot: Secondary | ICD-10-CM | POA: Diagnosis not present

## 2020-12-29 DIAGNOSIS — M7742 Metatarsalgia, left foot: Secondary | ICD-10-CM | POA: Diagnosis not present

## 2020-12-29 DIAGNOSIS — M19079 Primary osteoarthritis, unspecified ankle and foot: Secondary | ICD-10-CM | POA: Diagnosis not present

## 2020-12-29 DIAGNOSIS — M61561 Other ossification of muscle, right lower leg: Secondary | ICD-10-CM | POA: Diagnosis not present

## 2020-12-29 DIAGNOSIS — M61562 Other ossification of muscle, left lower leg: Secondary | ICD-10-CM

## 2020-12-29 DIAGNOSIS — M2041 Other hammer toe(s) (acquired), right foot: Secondary | ICD-10-CM | POA: Diagnosis not present

## 2020-12-29 DIAGNOSIS — D2121 Benign neoplasm of connective and other soft tissue of right lower limb, including hip: Secondary | ICD-10-CM | POA: Diagnosis not present

## 2020-12-29 DIAGNOSIS — M19071 Primary osteoarthritis, right ankle and foot: Secondary | ICD-10-CM | POA: Diagnosis not present

## 2020-12-29 DIAGNOSIS — M25571 Pain in right ankle and joints of right foot: Secondary | ICD-10-CM | POA: Diagnosis not present

## 2020-12-29 DIAGNOSIS — M21541 Acquired clubfoot, right foot: Secondary | ICD-10-CM | POA: Diagnosis not present

## 2020-12-29 DIAGNOSIS — M216X1 Other acquired deformities of right foot: Secondary | ICD-10-CM | POA: Diagnosis not present

## 2020-12-29 MED ORDER — CEPHALEXIN 500 MG PO CAPS: ORAL_CAPSULE | ORAL | 0 refills | Status: DC

## 2020-12-29 MED ORDER — OXYCODONE-ACETAMINOPHEN 5-325 MG PO TABS: 1.0000 | ORAL_TABLET | ORAL | 0 refills | Status: DC | PRN

## 2020-12-29 MED ORDER — ONDANSETRON HCL 4 MG PO TABS: 4.0000 mg | ORAL_TABLET | Freq: Three times a day (TID) | ORAL | 0 refills | Status: DC | PRN

## 2020-12-29 NOTE — Progress Notes (Signed)
Rx sent to pharmacy for outpatient surgery. °

## 2020-12-30 ENCOUNTER — Ambulatory Visit: Payer: Medicare HMO

## 2020-12-30 ENCOUNTER — Telehealth: Payer: Self-pay | Admitting: Podiatry

## 2020-12-30 NOTE — Telephone Encounter (Signed)
Pt's husband would like to know how many hours or times a day can she stand on her foot? Please advise.

## 2020-12-30 NOTE — Telephone Encounter (Signed)
Called and spoke to patient and advised them. Per patient toes are looking with good color, pain controlled, does feel like the dressing is a little tight. I allowed her to loosen the ACE bandage and re-wrap

## 2021-01-04 ENCOUNTER — Other Ambulatory Visit: Payer: Self-pay

## 2021-01-04 ENCOUNTER — Telehealth: Payer: Self-pay | Admitting: Pulmonary Disease

## 2021-01-04 ENCOUNTER — Ambulatory Visit (INDEPENDENT_AMBULATORY_CARE_PROVIDER_SITE_OTHER): Payer: Medicare HMO

## 2021-01-04 ENCOUNTER — Ambulatory Visit (INDEPENDENT_AMBULATORY_CARE_PROVIDER_SITE_OTHER): Payer: Medicare HMO | Admitting: Podiatry

## 2021-01-04 DIAGNOSIS — M2041 Other hammer toe(s) (acquired), right foot: Secondary | ICD-10-CM

## 2021-01-04 NOTE — Progress Notes (Signed)
  Subjective:  Patient ID: Lindsay Porter, female    DOB: 1940-08-24,  MRN: [id number]  Chief Complaint  Patient presents with   Routine Post Op    POV #1 DOS 12/29/2020 RT FOOT STRAIGHTENING OSTEOTOMY 1ST TOE, 2ND HAMMERTOE CORRECTION, 2ND METATARSAL SHORTENING, MIDFOOT EXOSTECTOMY MEDICAL & LATERAL FOOT, JOINTS,LEG REMOVAL OF OSSICLE  Denies f/c/n/v. Manageable pain lvls.    DOS: 12/29/20 Procedure: Right foot Akin, 2nd metatarsal osteotomy, 2nd hammertoe correction, removal of mass of leg, midfoot exostectomy x2  80 y.o. female presents with the above complaint. History confirmed with patient.   Objective:  Physical Exam: tenderness at the surgical site, local edema noted, and calf supple, nontender. Incision: healing well, no significant drainage, no dehiscence, no significant erythema  No images are attached to the encounter.  Radiographs: X-ray of the right foot: consistent with post-op state no apparent hardware failure or complications   Assessment:   1. Hammer toe of right foot    Plan:  Patient was evaluated and treated and all questions answered.  Post-operative State -XR reviewed with patient. Great toe appears slight varus on XR, secondary to bandaging and small shift of osteotomy. Should reduce now so that toe is straight. -Discussed importance of wearing her boot even for trips to the bathroom. She has not been wearing it for short distances such as this. Ok to sleep with the boot off so long as the she replaces it for putting any weight on the foot even to the bathroom. -Dressing applied consisting of adaptic, sterile gauze, kerlix, and ACE bandage -WBAT in CAM boot -XRs needed at follow-up: none  Return in about 1 week (around 01/11/2021) for suture removal.

## 2021-01-04 NOTE — Telephone Encounter (Signed)
Received a surgical clearance for patient from [facility]. Patient was last seen by JD on 08/11/20. Patient is scheduled for a follow on with JD on 01/27/21. Surgical clearance can be addressed at that appointment.   I have left a message with the surgical center to see exactly what type of procedure the patient will be having.

## 2021-01-11 ENCOUNTER — Other Ambulatory Visit: Payer: Self-pay

## 2021-01-11 ENCOUNTER — Ambulatory Visit
Admission: RE | Admit: 2021-01-11 | Discharge: 2021-01-11 | Disposition: A | Discharge status: Home / Self Care | Payer: Medicare HMO | Source: Ambulatory Visit | Attending: Pulmonary Disease | Admitting: Pulmonary Disease

## 2021-01-11 ENCOUNTER — Ambulatory Visit (INDEPENDENT_AMBULATORY_CARE_PROVIDER_SITE_OTHER): Payer: Medicare HMO | Admitting: Podiatry

## 2021-01-11 DIAGNOSIS — Z9889 Other specified postprocedural states: Secondary | ICD-10-CM

## 2021-01-11 DIAGNOSIS — I700 Atherosclerosis of aorta: Secondary | ICD-10-CM | POA: Diagnosis not present

## 2021-01-11 DIAGNOSIS — M2041 Other hammer toe(s) (acquired), right foot: Secondary | ICD-10-CM

## 2021-01-11 DIAGNOSIS — I2510 Atherosclerotic heart disease of native coronary artery without angina pectoris: Secondary | ICD-10-CM | POA: Diagnosis not present

## 2021-01-11 DIAGNOSIS — K449 Diaphragmatic hernia without obstruction or gangrene: Secondary | ICD-10-CM | POA: Diagnosis not present

## 2021-01-11 DIAGNOSIS — R059 Cough, unspecified: Secondary | ICD-10-CM

## 2021-01-11 DIAGNOSIS — D239 Other benign neoplasm of skin, unspecified: Secondary | ICD-10-CM

## 2021-01-11 DIAGNOSIS — J984 Other disorders of lung: Secondary | ICD-10-CM | POA: Diagnosis not present

## 2021-01-11 NOTE — Progress Notes (Signed)
  Subjective:  Patient ID: Lindsay Porter, female    DOB: 1940/12/07,  MRN: [id number]  No chief complaint on file.  DOS: 12/29/20 Procedure: Right foot Akin, 2nd metatarsal osteotomy, 2nd hammertoe correction, removal of mass of leg, midfoot exostectomy x2  80 y.o. female presents with the above complaint. History confirmed with patient. Pain well controlled. Denies new post-op issues. Wearing boot as directed.  Objective:  Physical Exam: tenderness at the surgical site, local edema noted, and calf supple, nontender. Incision: healing well, no significant drainage, no dehiscence, no significant erythema    Assessment:   1. Hammer toe of right foot   2. Post-operative state   3. Leiomyoma of the skin    Plan:  Patient was evaluated and treated and all questions answered.  Post-operative State -Sutures removed -Steri-strips applied to the incision -Pathology results reviewed with patient - leiomyoma -Dressing applied consisting of sterile gauze, kerlix, and ACE bandage -WBAT in CAM boot -XRs needed at follow-up: 3 view Foot -Plan for pin removal and transition to surgical shoe.  No follow-ups on file.

## 2021-01-18 ENCOUNTER — Encounter: Payer: Medicare HMO | Admitting: Podiatry

## 2021-01-20 DIAGNOSIS — M2669 Other specified disorders of temporomandibular joint: Secondary | ICD-10-CM | POA: Diagnosis not present

## 2021-01-20 DIAGNOSIS — J342 Deviated nasal septum: Secondary | ICD-10-CM | POA: Diagnosis not present

## 2021-01-20 DIAGNOSIS — K219 Gastro-esophageal reflux disease without esophagitis: Secondary | ICD-10-CM | POA: Diagnosis not present

## 2021-01-20 DIAGNOSIS — J343 Hypertrophy of nasal turbinates: Secondary | ICD-10-CM | POA: Diagnosis not present

## 2021-01-20 DIAGNOSIS — J3489 Other specified disorders of nose and nasal sinuses: Secondary | ICD-10-CM | POA: Diagnosis not present

## 2021-01-25 ENCOUNTER — Ambulatory Visit (INDEPENDENT_AMBULATORY_CARE_PROVIDER_SITE_OTHER): Payer: Medicare HMO

## 2021-01-25 ENCOUNTER — Other Ambulatory Visit: Payer: Self-pay

## 2021-01-25 ENCOUNTER — Ambulatory Visit (INDEPENDENT_AMBULATORY_CARE_PROVIDER_SITE_OTHER): Payer: Medicare HMO | Admitting: Podiatry

## 2021-01-25 DIAGNOSIS — Z9889 Other specified postprocedural states: Secondary | ICD-10-CM

## 2021-01-25 DIAGNOSIS — M2041 Other hammer toe(s) (acquired), right foot: Secondary | ICD-10-CM

## 2021-01-25 NOTE — Progress Notes (Signed)
  Subjective:  Patient ID: Lindsay Porter, female    DOB: 1940-11-08,  MRN: [id number]  Chief Complaint  Patient presents with   Routine Post Op    POV #2 DOS 12/29/2020 RT FOOT STRAIGHTENING OSTEOTOMY 1ST TOE, 2ND HAMMERTOE CORRECTION, 2ND METATARSAL SHORTENING, MIDFOOT EXOSTECTOMY MEDICAL & LATERAL FOOT, JOINTS,LEG REMOVAL OF OSSICLE. Pt states she is doing well.    DOS: 12/29/20 Procedure: Right foot Akin, 2nd metatarsal osteotomy, 2nd hammertoe correction, removal of mass of leg, midfoot exostectomy x2  80 y.o. female presents with the above complaint. History confirmed with patient. Denies pain or discomfort.  Objective:  Physical Exam: tenderness at the surgical site, local edema noted, and calf supple, nontender. Incision: Well healed incisions pin sites without sign of infection.   Assessment:   1. Post-operative state   2. Hammer toe of right foot    Plan:  Patient was evaluated and treated and all questions answered.  Post-operative State -Xrs taken and reviewed. Good alignment with healing of osteotomies noted.  -Pin pulled 2nd toe -Ok to shower but not soak. -Ok to transition to surgical shoe. Dispensed today. Advised to transition slowly. -XRs needed at follow-up: 3 view Foot   Return in about 2 weeks (around 02/08/2021) for Post-Op (with XRs).

## 2021-01-27 ENCOUNTER — Ambulatory Visit: Payer: Medicare HMO | Admitting: Pulmonary Disease

## 2021-01-27 ENCOUNTER — Other Ambulatory Visit: Payer: Self-pay

## 2021-01-27 ENCOUNTER — Encounter: Payer: Self-pay | Admitting: Pulmonary Disease

## 2021-01-27 VITALS — BP 116/74 | HR 72 | Ht 66.0 in | Wt 168.6 lb

## 2021-01-27 DIAGNOSIS — J449 Chronic obstructive pulmonary disease, unspecified: Secondary | ICD-10-CM

## 2021-01-27 DIAGNOSIS — Z87891 Personal history of nicotine dependence: Secondary | ICD-10-CM

## 2021-01-27 MED ORDER — INCRUSE ELLIPTA 62.5 MCG/INH IN AEPB
1.0000 | INHALATION_SPRAY | Freq: Every day | RESPIRATORY_TRACT | 6 refills | Status: DC
Start: 2021-01-27 — End: 2021-04-28

## 2021-01-27 NOTE — Patient Instructions (Addendum)
Hold symbicort inhaler use  Start incruse inhaler 1 inhalation daily  Use albuterol inhaler as needed  We will refer you to our lung cancer screening program for a CT chest scan next year.

## 2021-01-27 NOTE — Progress Notes (Signed)
Synopsis: Referred in January 2022 for abnormal chest radiograph  Subjective:   PATIENT ID: Lindsay Porter GENDER: female DOB: 03/07/1941, MRN: [id number]   HPI  Chief Complaint  Patient presents with   Follow-up    5 mo f/u for COPD and discuss CT scan results from 07/12. States her breathing has been stable since last visit.    Lindsay Porter is a 80 year old woman, former smoker with COPD who returns to pulmonary clinic for follow up.  CT Chest 01/11/21 did not show any changes of the chronic scarring of the bilateral bases. There is thickening of the distal esophagus. She is currently following with GI and had EGD done 12/2020. She was also evaluated by ENT 01/20/21 with concern for chronic clearing of her throat. Flexible laryngoscopy with moderate interarytenoid edema and erythema consistent with laryngopharyngeal reflux. Normal vocal cord mobility.   She denies issues of shortness of breath and wheezing.  OV 08/11/20 She was previously followed by Dr. Lake Bells. She remains on symbicort 80-4.64mg 2 puffs twice daily with no issues reported in her breathing. She does complain of cough, sinus congestion and runny nose.    She complains of dysphagia when eating solid foods where the food gets stuck in her throat.   She had a chest radiograph done 05/31/20 to follow up a left lower lobe and lingular lesion noted on a chest radiograph from 01/08/2019 after a fall. There was no change in the left lower lobe or lingular airspace opacity.   Past Medical History:  Diagnosis Date   Alcohol abuse    Allergy    Anemia, iron deficiency    Arthritis    Carpal tunnel syndrome    COPD (chronic obstructive pulmonary disease) (HCC)    Depression    Diverticulosis    GERD (gastroesophageal reflux disease)    Hiatal hernia    Hyperlipidemia    Insomnia    Osteopenia    Ovarian cyst    Restless leg syndrome    Seasonal allergies    Vitamin D deficiency    Wears contact lenses       Family History  Problem Relation Age of Onset   Alcoholism Father    Rheum arthritis Mother    Asthma Mother    Arthritis Sister    Colon cancer Neg Hx    Colon polyps Neg Hx    Esophageal cancer Neg Hx    Pancreatic cancer Neg Hx    Stomach cancer Neg Hx    Rectal cancer Neg Hx      Social History   Socioeconomic History   Marital status: Married    Spouse name: Not on file   Number of children: Not on file   Years of education: Not on file   Highest education level: Not on file  Occupational History   Occupation: RETIRED  Tobacco Use   Smoking status: Former    Packs/day: 2.00    Years: 45.00    Pack years: 90.00    Types: Cigarettes    Quit date: 10/02/2007    Years since quitting: 13.3   Smokeless tobacco: Never  Vaping Use   Vaping Use: Never used  Substance and Sexual Activity   Alcohol use: Not Currently    Comment: 6 months sober 4-0-2022   Drug use: No   Sexual activity: Not on file  Other Topics Concern   Not on file  Social History Narrative   Not on file   Social Determinants  of Health   Financial Resource Strain: Not on file  Food Insecurity: Not on file  Transportation Needs: Not on file  Physical Activity: Not on file  Stress: Not on file  Social Connections: Not on file  Intimate Partner Violence: Not on file     Allergies  Allergen Reactions   Singulair [Montelukast Sodium] Hives   Sulfa Antibiotics Other (See Comments)    Childhood reaction      Outpatient Medications Prior to Visit  Medication Sig Dispense Refill   acetaminophen (TYLENOL) 500 MG tablet 500 mg every 6 (six) hours as needed.     albuterol (VENTOLIN HFA) 108 (90 Base) MCG/ACT inhaler Inhale 2 puffs into the lungs every 6 (six) hours as needed.     alendronate (FOSAMAX) 70 MG tablet Take 70 mg by mouth once a week.     amphetamine-dextroamphetamine (ADDERALL) 20 MG tablet Take 20 mg by mouth daily.     budesonide-formoterol (SYMBICORT) 160-4.5 MCG/ACT inhaler  Inhale 2 puffs into the lungs 2 (two) times daily.     cetirizine (ZYRTEC) 10 MG tablet Take 1 tablet (10 mg total) by mouth daily. 30 tablet 0   Cholecalciferol (VITAMIN D) 50 MCG (2000 UT) tablet Take 2,000 Units by mouth daily.     ferrous sulfate 325 (65 FE) MG tablet Take 325 mg by mouth daily at 2 PM.     gabapentin (NEURONTIN) 300 MG capsule Take 2 capsules by mouth at bedtime.     oxyCODONE-acetaminophen (PERCOCET) 5-325 MG tablet Take 1 tablet by mouth every 4 (four) hours as needed for severe pain. 20 tablet 0   pantoprazole (PROTONIX) 40 MG tablet Take 1 tablet (40 mg total) by mouth 2 (two) times daily before a meal. Take pantoprazole 40 mg twice daily for 3 months. 60 tablet 3   pramipexole (MIRAPEX) 0.5 MG tablet Take 1 tablet by mouth daily.     sertraline (ZOLOFT) 100 MG tablet Take 1 tablet by mouth daily.     cephALEXin (KEFLEX) 500 MG capsule Take 1 tablet by mouth tonight, then one tomorrow AM and one in PM 3 capsule 0   EPINEPHrine (EPI-PEN) 0.3 mg/0.3 mL DEVI Inject 0.3 mg into the muscle as needed (for allergic reaction).  (Patient not taking: No sig reported)     meloxicam (MOBIC) 15 MG tablet daily.     omeprazole (PRILOSEC) 20 MG capsule Take 1 tablet by mouth daily. Takes 2 tablets daily (Patient not taking: No sig reported)     ondansetron (ZOFRAN) 4 MG tablet Take 1 tablet (4 mg total) by mouth every 8 (eight) hours as needed for nausea or vomiting. 20 tablet 0   rosuvastatin (CRESTOR) 10 MG tablet Take 10 mg by mouth at bedtime.     Spacer/Aero-Holding Chambers (AEROCHAMBER MV) inhaler Use as instructed (Patient not taking: Reported on 12/23/2020) 1 each 0   VENTOLIN HFA 108 (90 Base) MCG/ACT inhaler INHALE 2 PUFFS INTO THE LUNGS EVERY 6 HOURS AS NEEDED FOR WHEEZING OR SHORTNESS OF BREATH (Patient not taking: Reported on 12/23/2020) 18 Inhaler 5   No facility-administered medications prior to visit.    Review of Systems  Constitutional:  Negative for chills, fever,  malaise/fatigue and weight loss.  HENT:  Positive for congestion. Negative for sinus pain and sore throat.   Eyes: Negative.   Respiratory:  Positive for cough. Negative for hemoptysis, sputum production, shortness of breath and wheezing.   Cardiovascular:  Negative for chest pain, palpitations, orthopnea, claudication and leg swelling.  Gastrointestinal:  Negative for abdominal pain, heartburn, nausea and vomiting.  Genitourinary: Negative.   Musculoskeletal:  Negative for joint pain and myalgias.  Skin:  Negative for rash.  Neurological:  Negative for weakness.  Endo/Heme/Allergies: Negative.   Psychiatric/Behavioral: Negative.     Objective:   Vitals:   01/27/21 1209  BP: 116/74  Pulse: 72  SpO2: 97%  Weight: 168 lb 9.6 oz (76.5 kg)  Height: '5\' 6"'$  (1.676 m)     Physical Exam Constitutional:      General: She is not in acute distress.    Appearance: Normal appearance. She is not ill-appearing.  HENT:     Head: Normocephalic and atraumatic.  Eyes:     General: No scleral icterus.    Conjunctiva/sclera: Conjunctivae normal.     Pupils: Pupils are equal, round, and reactive to light.  Cardiovascular:     Rate and Rhythm: Normal rate and regular rhythm.     Pulses: Normal pulses.     Heart sounds: Normal heart sounds. No murmur heard. Pulmonary:     Effort: Pulmonary effort is normal.     Breath sounds: Normal breath sounds. No wheezing, rhonchi or rales.  Abdominal:     General: Bowel sounds are normal.     Palpations: Abdomen is soft.  Musculoskeletal:     Right lower leg: No edema.     Left lower leg: No edema.  Skin:    General: Skin is warm and dry.  Neurological:     General: No focal deficit present.     Mental Status: She is alert.    CBC    Component Value Date/Time   WBC 7.0 01/09/2019 0424   RBC 4.50 01/09/2019 0424   HGB 13.8 01/09/2019 0424   HCT 43.3 01/09/2019 0424   PLT 184 01/09/2019 0424   MCV 96.2 01/09/2019 0424   MCH 30.7 01/09/2019  0424   MCHC 31.9 01/09/2019 0424   RDW 13.3 01/09/2019 0424   LYMPHSABS 1,624 10/28/2015 1234   MONOABS 336 10/28/2015 1234   EOSABS 56 10/28/2015 1234   BASOSABS 56 10/28/2015 1234   Chest imaging: CT Chest 01/11/21 Mild scarring at the lung bases.   Moderate hiatal hernia with esophageal wall thickening. Present previously and may reflect reflux esophagitis.   Coronary artery and thoracic aortic calcification.  CXR 05/31/20 1. Chronic airspace opacity along the left hemidiaphragm, favoring lingular and left lower lobe scarring. Appearance not appreciably changed from 01/08/2019. 2. Degenerative glenohumeral arthropathy, left greater than right. 3. Aortic Atherosclerosis (ICD10-I70.0).  CT Chest 01/09/2019 Mediastinum/Nodes: Moderate hiatal hernia. No adenopathy. Thyroid gland and trachea appear normal.   Lungs/Pleura: There is linear atelectasis at both lung bases primarily posteriorly. No mass lesions or infiltrates or effusions.  PFT: No flowsheet data found.   MBS 08/17/20 Patient presents with a grossly functional/minimal oropharyngeal  dysphagia and suspected primary esophageal dysphagia. Pt with some lingual pumping of oral boluses and required multiple swallows to clear oral cavity. Airway protection was excellent without laryngeal penetration or tracheal aspiration. Mild vallecular and pyriform sinus residuals noted with thin liquids that cleared with second reflexive swallows. Pt with cricopharyngeal prominence. Upon esopahgeal sweep, barium retention noted . Pt with report of globus sensation in throat of tablet despite barium tablet clearing pharynx and cervical esophagus (suspect referred sensation). Recommend GI consult with esophageal workup for globus sensation, progressive hoarseness and faily unremarkable MBSS. Continue regular thin liquid diet.  Assessment & Plan:   Chronic obstructive pulmonary disease, unspecified COPD  type [facility])  Discussion: Tiaunna Stockwell is a 80 year old woman, former smoker with COPD who returns to pulmonary clinic for follow up.   Her COPD continues to be well controlled at this time. We will transition from symbicort to incruse inhaler to see if removing the ICS will help with her throat clearing issues.   She is to continue PPI therapy for reflux per GI.   No issues noted on her speech evaluation with modified barium swallow.  Based on her smoking history, she does qualify for lung cancer screening. We will place referral at next visit as her recent scan does not show concerning nodules or lesions.   Follow up in 1 year.  Freda Jackson, MD [facility] Office: [phone]    Current Outpatient Medications:    acetaminophen (TYLENOL) 500 MG tablet, 500 mg every 6 (six) hours as needed., Disp: , Rfl:    albuterol (VENTOLIN HFA) 108 (90 Base) MCG/ACT inhaler, Inhale 2 puffs into the lungs every 6 (six) hours as needed., Disp: , Rfl:    alendronate (FOSAMAX) 70 MG tablet, Take 70 mg by mouth once a week., Disp: , Rfl:    amphetamine-dextroamphetamine (ADDERALL) 20 MG tablet, Take 20 mg by mouth daily., Disp: , Rfl:    budesonide-formoterol (SYMBICORT) 160-4.5 MCG/ACT inhaler, Inhale 2 puffs into the lungs 2 (two) times daily., Disp: , Rfl:    cetirizine (ZYRTEC) 10 MG tablet, Take 1 tablet (10 mg total) by mouth daily., Disp: 30 tablet, Rfl: 0   Cholecalciferol (VITAMIN D) 50 MCG (2000 UT) tablet, Take 2,000 Units by mouth daily., Disp: , Rfl:    ferrous sulfate 325 (65 FE) MG tablet, Take 325 mg by mouth daily at 2 PM., Disp: , Rfl:    gabapentin (NEURONTIN) 300 MG capsule, Take 2 capsules by mouth at bedtime., Disp: , Rfl:    oxyCODONE-acetaminophen (PERCOCET) 5-325 MG tablet, Take 1 tablet by mouth every 4 (four) hours as needed for severe pain., Disp: 20 tablet, Rfl: 0   pantoprazole (PROTONIX) 40 MG tablet, Take 1 tablet (40 mg total) by mouth 2 (two) times daily before a meal. Take  pantoprazole 40 mg twice daily for 3 months., Disp: 60 tablet, Rfl: 3   pramipexole (MIRAPEX) 0.5 MG tablet, Take 1 tablet by mouth daily., Disp: , Rfl:    sertraline (ZOLOFT) 100 MG tablet, Take 1 tablet by mouth daily., Disp: , Rfl:

## 2021-02-08 ENCOUNTER — Other Ambulatory Visit: Payer: Self-pay

## 2021-02-08 ENCOUNTER — Ambulatory Visit (INDEPENDENT_AMBULATORY_CARE_PROVIDER_SITE_OTHER): Payer: Medicare HMO

## 2021-02-08 ENCOUNTER — Ambulatory Visit (INDEPENDENT_AMBULATORY_CARE_PROVIDER_SITE_OTHER): Payer: Medicare HMO | Admitting: Podiatry

## 2021-02-08 DIAGNOSIS — R52 Pain, unspecified: Secondary | ICD-10-CM | POA: Insufficient documentation

## 2021-02-08 DIAGNOSIS — Z9889 Other specified postprocedural states: Secondary | ICD-10-CM

## 2021-02-08 DIAGNOSIS — M61562 Other ossification of muscle, left lower leg: Secondary | ICD-10-CM

## 2021-02-08 DIAGNOSIS — D239 Other benign neoplasm of skin, unspecified: Secondary | ICD-10-CM

## 2021-02-08 DIAGNOSIS — M19079 Primary osteoarthritis, unspecified ankle and foot: Secondary | ICD-10-CM

## 2021-02-08 DIAGNOSIS — M2041 Other hammer toe(s) (acquired), right foot: Secondary | ICD-10-CM

## 2021-02-08 NOTE — Progress Notes (Signed)
  Subjective:  Patient ID: Lindsay Porter, female    DOB: 1941/03/04,  MRN: [id number]  Chief Complaint  Patient presents with   Routine Post Op    POV #3 DOS 12/29/2020 RT FOOT STRAIGHTENING OSTEOTOMY 1ST TOE, 2ND HAMMERTOE CORRECTION, 2ND METATARSAL SHORTENING, MIDFOOT EXOSTECTOMY MEDICAL & LATERAL FOOT, JOINTS,LEG REMOVAL OF OSSICLE. Pt states she has been doing well.    DOS: 12/29/20 Procedure: Right foot Akin, 2nd metatarsal osteotomy, 2nd hammertoe correction, removal of mass of leg, midfoot exostectomy x2  80 y.o. female presents with the above complaint. History confirmed with patient. Objective:  Physical Exam: no tenderness at the surgical site, local edema noted, and calf supple, nontender. Good 1st MPJ ROM. Mild edema 2nd toe. Incision: Well healed incisions Assessment:   1. Post-operative state   2. Hammer toe of right foot   3. Leiomyoma of the skin   4. Primary osteoarthritis of talonavicular joint   5. Other ossification of muscle, left lower leg    Plan:  Patient was evaluated and treated and all questions answered.  Post-operative State -XR taken and reviewed. Healed osteotomy noted 1st MPJ.  -Good digital alignment noted. -At this point can transition to normal shoegear. Start 2-3 hours daily, then double each day. -XRs needed at follow-up: none   Return in about 6 weeks (around 03/22/2021) for Post-Op (No XRs).

## 2021-03-03 ENCOUNTER — Telehealth: Payer: Self-pay | Admitting: *Deleted

## 2021-03-03 NOTE — Telephone Encounter (Signed)
Patient is calling after falling and injuring her surgery foot 2 days ago,is black and blue,icing but not helping.Please advise.  Returned call to patient and has scheduled her to come in to be reevaluated on 03/04/21 by Dr Paulla Dolly.

## 2021-03-04 ENCOUNTER — Ambulatory Visit (INDEPENDENT_AMBULATORY_CARE_PROVIDER_SITE_OTHER): Payer: Medicare HMO

## 2021-03-04 ENCOUNTER — Other Ambulatory Visit: Payer: Self-pay | Admitting: Podiatry

## 2021-03-04 ENCOUNTER — Other Ambulatory Visit: Payer: Self-pay

## 2021-03-04 ENCOUNTER — Ambulatory Visit: Payer: Medicare HMO | Admitting: Podiatry

## 2021-03-04 DIAGNOSIS — S90121A Contusion of right lesser toe(s) without damage to nail, initial encounter: Secondary | ICD-10-CM

## 2021-03-04 DIAGNOSIS — M2041 Other hammer toe(s) (acquired), right foot: Secondary | ICD-10-CM

## 2021-03-04 NOTE — Telephone Encounter (Signed)
Patient was seen today.

## 2021-03-14 NOTE — Progress Notes (Signed)
  Subjective:  Patient ID: Lindsay Porter, female    DOB: 1941/07/02,  MRN: [id number]  Chief Complaint  Patient presents with   Toe Injury    Right 2/3/4/5 injured in fall on Tuesday August 30th. Pt states bruising pain and swelling in these digits.   DOS: 12/29/20 Procedure: Right foot Akin, 2nd metatarsal osteotomy, 2nd hammertoe correction, removal of mass of leg, midfoot exostectomy x2  80 y.o. female presents with the above complaint. History confirmed with patient. Objective:  Physical Exam: no tenderness at the surgical site, local edema noted, and calf supple, nontender. Good 1st MPJ ROM.  Bruising contusions noted about the distal foot including second third fourth toes Incision: Well healed incisions Assessment:   1. Contusion of lesser toe of right foot without damage to nail, initial encounter    Plan:  Patient was evaluated and treated and all questions answered.  Post-operative State -Repeat x-rays with previous healing of osteotomies without any new fractures. -Discussed with patient that there is no new injury and she likely bruised the foot and has a contusion.  Continue to rest as needed and we will further check Schedule follow-up  No follow-ups on file.

## 2021-03-18 ENCOUNTER — Other Ambulatory Visit: Payer: Self-pay

## 2021-03-18 ENCOUNTER — Ambulatory Visit (INDEPENDENT_AMBULATORY_CARE_PROVIDER_SITE_OTHER): Payer: Medicare HMO | Admitting: Podiatry

## 2021-03-18 ENCOUNTER — Ambulatory Visit (INDEPENDENT_AMBULATORY_CARE_PROVIDER_SITE_OTHER): Payer: Medicare HMO

## 2021-03-18 DIAGNOSIS — M2041 Other hammer toe(s) (acquired), right foot: Secondary | ICD-10-CM | POA: Diagnosis not present

## 2021-03-18 DIAGNOSIS — Z9889 Other specified postprocedural states: Secondary | ICD-10-CM

## 2021-03-18 NOTE — Progress Notes (Signed)
  Subjective:  Patient ID: Lindsay Porter, female    DOB: [birth year],  MRN: [id number]  Chief Complaint  Patient presents with   Routine Post Op    POV#4 DOS 6.29.2022 Pt states some occasional throbbing pain in right lateral foot but it is minimal and transient.   DOS: 12/29/20 Procedure: Right foot Akin, 2nd metatarsal osteotomy, 2nd hammertoe correction, removal of mass of leg, midfoot exostectomy x2  80 y.o. female presents with the above complaint. History confirmed with patient. Objective:  Physical Exam: no tenderness at the surgical site, local edema noted, and calf supple, nontender. Good 1st MPJ ROM.    Incision: Well healed incisions Assessment:   1. Hammer toe of right foot   2. Post-operative state    Plan:  Patient was evaluated and treated and all questions answered.  Post-operative State -Repeat XR without acute changes - stable midfoot arthritic changes.  -Local edema of the 1st/2nd toes without pain - patient given coban for compression. -Discussed she may have occasional arthritic pains but she is not experiencing the severe pains she did pre-op. -Will discharge with f/u as needed.  No follow-ups on file.

## 2021-03-29 DIAGNOSIS — Z23 Encounter for immunization: Secondary | ICD-10-CM | POA: Diagnosis not present

## 2021-03-29 DIAGNOSIS — R41840 Attention and concentration deficit: Secondary | ICD-10-CM | POA: Diagnosis not present

## 2021-03-29 DIAGNOSIS — R69 Illness, unspecified: Secondary | ICD-10-CM | POA: Diagnosis not present

## 2021-04-25 DIAGNOSIS — R490 Dysphonia: Secondary | ICD-10-CM | POA: Diagnosis not present

## 2021-04-25 DIAGNOSIS — M26609 Unspecified temporomandibular joint disorder, unspecified side: Secondary | ICD-10-CM | POA: Diagnosis not present

## 2021-04-25 DIAGNOSIS — K219 Gastro-esophageal reflux disease without esophagitis: Secondary | ICD-10-CM | POA: Diagnosis not present

## 2021-04-25 DIAGNOSIS — J309 Allergic rhinitis, unspecified: Secondary | ICD-10-CM | POA: Diagnosis not present

## 2021-04-27 ENCOUNTER — Telehealth: Payer: Self-pay | Admitting: Pulmonary Disease

## 2021-04-28 MED ORDER — BUDESONIDE-FORMOTEROL FUMARATE 160-4.5 MCG/ACT IN AERO: 2.0000 | INHALATION_SPRAY | Freq: Two times a day (BID) | RESPIRATORY_TRACT | 6 refills | Status: DC

## 2021-04-28 NOTE — Telephone Encounter (Signed)
I called the patient back and gave her the recommendations of Dr. Lavell Anchors and she voices understanding. Nothing further needed. She is aware that her prescription would be sent to the pharmacy. Nothing further needed.

## 2021-04-28 NOTE — Telephone Encounter (Signed)
I called the patient and she does not feel that the incruse is helping with her breathing. She feels that she was doing better on the Symbicort. Please advise on change.   Since Dr. Erin Fulling is off can you please advise on the change?

## 2021-04-28 NOTE — Telephone Encounter (Signed)
Ok to change to [employer]

## 2021-05-16 ENCOUNTER — Other Ambulatory Visit: Payer: Self-pay | Admitting: Internal Medicine

## 2021-05-31 DIAGNOSIS — M170 Bilateral primary osteoarthritis of knee: Secondary | ICD-10-CM | POA: Diagnosis not present

## 2021-05-31 DIAGNOSIS — M21062 Valgus deformity, not elsewhere classified, left knee: Secondary | ICD-10-CM | POA: Diagnosis not present

## 2021-06-14 DIAGNOSIS — J383 Other diseases of vocal cords: Secondary | ICD-10-CM | POA: Diagnosis not present

## 2021-06-14 DIAGNOSIS — R490 Dysphonia: Secondary | ICD-10-CM | POA: Diagnosis not present

## 2021-06-14 DIAGNOSIS — J385 Laryngeal spasm: Secondary | ICD-10-CM | POA: Diagnosis not present

## 2021-06-14 DIAGNOSIS — R1310 Dysphagia, unspecified: Secondary | ICD-10-CM | POA: Diagnosis not present

## 2021-06-14 DIAGNOSIS — R059 Cough, unspecified: Secondary | ICD-10-CM | POA: Diagnosis not present

## 2021-06-14 DIAGNOSIS — J384 Edema of larynx: Secondary | ICD-10-CM | POA: Diagnosis not present

## 2021-06-14 DIAGNOSIS — J3489 Other specified disorders of nose and nasal sinuses: Secondary | ICD-10-CM | POA: Diagnosis not present

## 2021-06-14 DIAGNOSIS — Z87891 Personal history of nicotine dependence: Secondary | ICD-10-CM | POA: Diagnosis not present

## 2021-10-13 ENCOUNTER — Other Ambulatory Visit: Payer: Self-pay | Admitting: Family Medicine

## 2021-10-13 DIAGNOSIS — Z1231 Encounter for screening mammogram for malignant neoplasm of breast: Secondary | ICD-10-CM

## 2021-10-25 ENCOUNTER — Encounter (HOSPITAL_BASED_OUTPATIENT_CLINIC_OR_DEPARTMENT_OTHER): Payer: Self-pay

## 2021-10-25 ENCOUNTER — Other Ambulatory Visit: Payer: Self-pay

## 2021-10-25 ENCOUNTER — Emergency Department (HOSPITAL_BASED_OUTPATIENT_CLINIC_OR_DEPARTMENT_OTHER): Payer: Medicare Other | Admitting: Radiology

## 2021-10-25 ENCOUNTER — Emergency Department (HOSPITAL_BASED_OUTPATIENT_CLINIC_OR_DEPARTMENT_OTHER)
Admission: EM | Admit: 2021-10-25 | Discharge: 2021-10-25 | Disposition: A | Discharge status: Home / Self Care | Payer: Medicare Other | Attending: Emergency Medicine | Admitting: Emergency Medicine

## 2021-10-25 DIAGNOSIS — S2231XA Fracture of one rib, right side, initial encounter for closed fracture: Secondary | ICD-10-CM | POA: Insufficient documentation

## 2021-10-25 DIAGNOSIS — R0781 Pleurodynia: Secondary | ICD-10-CM | POA: Diagnosis not present

## 2021-10-25 DIAGNOSIS — W19XXXA Unspecified fall, initial encounter: Secondary | ICD-10-CM | POA: Insufficient documentation

## 2021-10-25 DIAGNOSIS — S299XXA Unspecified injury of thorax, initial encounter: Secondary | ICD-10-CM | POA: Diagnosis present

## 2021-10-25 MED ORDER — LIDOCAINE 5 % EX PTCH
1.0000 | MEDICATED_PATCH | CUTANEOUS | Status: DC
Start: 2021-10-25 — End: 2021-10-25
  Administered 2021-10-25: 1 via TRANSDERMAL
  Filled 2021-10-25: qty 1

## 2021-10-25 MED ORDER — OXYCODONE HCL 5 MG PO TABS: 5.0000 mg | ORAL_TABLET | Freq: Three times a day (TID) | ORAL | 0 refills | Status: AC | PRN

## 2021-10-25 MED ORDER — CYCLOBENZAPRINE HCL 5 MG PO TABS: 5.0000 mg | ORAL_TABLET | Freq: Two times a day (BID) | ORAL | 0 refills | Status: AC | PRN

## 2021-10-25 NOTE — Discharge Instructions (Addendum)
X-ray did show you have 1 rib fracture on the right.  This should heal well with time and pain management.  I have also given you an incentive spirometer to help avoid infection.  Please use this every several hours while awake. ? ?Please return if you develop worsening pain, fever greater than 101. ? ?Recommend 1000 mg of Tylenol every 6 hours as needed for pain.  I have written you for both a narcotic pain medicine and muscle relaxant.  These medications are sedating.  Do not use with alcohol or drugs or other dangerous activities including driving.  Recommend lidocaine patch over-the-counter as well for pain. ? ?

## 2021-10-25 NOTE — ED Provider Notes (Signed)
?[facility] ?Provider Note ? ? ?CSN: [id number] ?Arrival date & time: 10/25/21  1351 ? ?  ? ?History ? ?Chief Complaint  ?Patient presents with  ? Fall  ? ? ?Lindsay Porter is a 81 y.o. female. ? ?Patient was helping to move a table yesterday.  She felt the table over to help get a folded up and then hit the right side of her ribs on it.  Felt originally pretty sore but woke up this morning with some increased discomfort.  Mostly when she twists.  She has not noticed any bruising.  She not on any blood thinners.  She not fall and hit her head.  This occurred about 24 hours ago. ? ?The history is provided by the patient.  ?Fall ?This is a new problem. The current episode started yesterday. The problem has not changed since onset.Associated symptoms include chest pain (right anterior rib wall pain). Pertinent negatives include no abdominal pain, no headaches and no shortness of breath. Nothing aggravates the symptoms. Nothing relieves the symptoms. She has tried nothing for the symptoms. The treatment provided no relief.  ? ?  ? ?Home Medications ?Prior to Admission medications   ?Medication Sig Start Date End Date Taking? Authorizing Provider  ?cyclobenzaprine (FLEXERIL) 5 MG tablet Take 1 tablet (5 mg total) by mouth 2 (two) times daily as needed for up to 20 days for muscle spasms. 10/25/21 11/14/21 Yes Myer Bohlman, DO  ?oxyCODONE (ROXICODONE) 5 MG immediate release tablet Take 1 tablet (5 mg total) by mouth every 8 (eight) hours as needed for up to 10 days for breakthrough pain. 10/25/21 11/04/21 Yes Frazer Rainville, DO  ?acetaminophen (TYLENOL) 500 MG tablet 500 mg every 6 (six) hours as needed.    [provider]  ?albuterol (VENTOLIN HFA) 108 (90 Base) MCG/ACT inhaler Inhale 2 puffs into the lungs every 6 (six) hours as needed.    [provider]  ?alendronate (FOSAMAX) 70 MG tablet Take 70 mg by mouth once a week. 05/07/19   [provider]   ?amphetamine-dextroamphetamine (ADDERALL) 20 MG tablet Take 20 mg by mouth daily.    [provider]  ?budesonide-formoterol (SYMBICORT) 160-4.5 MCG/ACT inhaler Inhale 2 puffs into the lungs 2 (two) times daily. 04/28/21   Freddi Starr, MD  ?cetirizine (ZYRTEC) 10 MG tablet Take 1 tablet (10 mg total) by mouth daily. 12/03/18   Orvan July, NP  ?Cholecalciferol (VITAMIN D) 50 MCG (2000 UT) tablet Take 2,000 Units by mouth daily. 09/10/19   [provider]  ?ferrous sulfate 325 (65 FE) MG tablet Take 325 mg by mouth daily at 2 PM. 09/10/19   [provider]  ?fluticasone (FLONASE) 50 MCG/ACT nasal spray Place 2 sprays into both nostrils daily. 01/20/21   [provider]  ?gabapentin (NEURONTIN) 300 MG capsule Take 2 capsules by mouth at bedtime.    [provider]  ?meloxicam (MOBIC) 15 MG tablet Mobic 15 mg tablet ? 1 tablet daily 12/28/20   [provider]  ?oxyCODONE-acetaminophen (PERCOCET) 5-325 MG tablet Take 1 tablet by mouth every 4 (four) hours as needed for severe pain. 12/29/20   Evelina Bucy, DPM  ?pantoprazole (PROTONIX) 40 MG tablet TAKE 1 TABLET TWICE A DAY BEFORE MEALS 05/16/21   Irene Shipper, MD  ?pramipexole (MIRAPEX) 0.5 MG tablet Take 1 tablet by mouth daily.    [provider]  ?sertraline (ZOLOFT) 100 MG tablet Take 1 tablet by mouth daily. 08/27/20   [provider]  ?   ? ?Allergies    ?Singulair [montelukast sodium] and Sulfa antibiotics   ? ?Review of Systems   ?Review of Systems  ?Respiratory:  Negative for shortness of breath.   ?Cardiovascular:  Positive for chest pain (right anterior rib wall pain).  ?Gastrointestinal:  Negative for abdominal pain.  ?Neurological:  Negative for headaches.  ? ?Physical Exam ?Updated Vital Signs ?BP 134/74 (BP Location: Right Arm)   Pulse 70   Temp 98.3 ?F (36.8 ?C) (Oral)   Resp 16   Ht '5\' 6"'$  (1.676 m)   Wt 76.5 kg   SpO2 96%   BMI 27.22 kg/m?  ?Physical Exam ?Vitals and  nursing note reviewed.  ?Constitutional:   ?   General: She is not in acute distress. ?   Appearance: She is well-developed. She is not ill-appearing.  ?HENT:  ?   Head: Normocephalic and atraumatic.  ?   Nose: Nose normal.  ?   Mouth/Throat:  ?   Mouth: Mucous membranes are moist.  ?   Pharynx: Oropharynx is clear.  ?Eyes:  ?   Extraocular Movements: Extraocular movements intact.  ?   Conjunctiva/sclera: Conjunctivae normal.  ?   Pupils: Pupils are equal, round, and reactive to light.  ?Cardiovascular:  ?   Rate and Rhythm: Normal rate and regular rhythm.  ?   Pulses: Normal pulses.  ?   Heart sounds: Normal heart sounds. No murmur heard. ?Pulmonary:  ?   Effort: Pulmonary effort is normal. No respiratory distress.  ?   Breath sounds: Normal breath sounds.  ?Abdominal:  ?   General: Abdomen is flat. There is no distension.  ?   Palpations: Abdomen is soft. There is no mass.  ?   Tenderness: There is no abdominal tenderness. There is no guarding or rebound.  ?   Hernia: No hernia is present.  ?Musculoskeletal:     ?   General: Tenderness present. No swelling.  ?   Cervical back: Normal range of motion and neck supple.  ?   Comments: No midline spinal tenderness, tenderness to the right anterior rib  ?Skin: ?   General: Skin is warm and dry.  ?   Capillary Refill: Capillary refill takes less than 2 seconds.  ?Neurological:  ?   General: No focal deficit present.  ?   Mental Status: She is alert and oriented to person, place, and time.  ?   Cranial Nerves: No cranial nerve deficit.  ?   Sensory: No sensory deficit.  ?   Motor: No weakness.  ?   Coordination: Coordination normal.  ?Psychiatric:     ?   Mood and Affect: Mood normal.  ? ? ?ED Results / Procedures / Treatments   ?Labs ?(all labs ordered are listed, but only abnormal results are displayed) ?Labs Reviewed - No data to display ? ?EKG ?None ? ?Radiology ?DG Ribs Unilateral W/Chest Right ? ?Result Date: 10/25/2021 ?CLINICAL DATA:  Right anterior rib pain after  falling onto with table yesterday. EXAM: RIGHT RIBS AND CHEST - 3+ VIEW COMPARISON:  None. FINDINGS: Nondisplaced fracture is noted involving the distal tip of the right anterior tenth rib. There are no additional fractures identified. Stable cardiomediastinal contours. Moderate hiatal hernia is again noted. Low lung volumes scar versus subsegmental atelectasis is identified within both lung bases. IMPRESSION: Nondisplaced fracture of the distal tip of the right anterior tenth rib. Lung volumes are low with bibasilar scar versus atelectasis. Moderate hiatal hernia. Electronically Signed  By: Kerby Moors M.D.   On: 10/25/2021 14:33   ? ?Procedures ?Procedures  ? ? ?Medications Ordered in ED ?Medications  ?lidocaine (LIDODERM) 5 % 1 patch (has no administration in time range)  ? ? ?ED Course/ Medical Decision Making/ A&P ?  ?                        ?Medical Decision Making ?Amount and/or Complexity of Data Reviewed ?Radiology: ordered. ? ?Risk ?Prescription drug management. ? ? ?Lindsay Porter is here with chest wall pain after injury yesterday.  Patient was helping to put away some tables when she hit the right side of her ribs on the edge of a table.  She did not hit her head or lose consciousness.  She is on blood thinners.  This occurred about 24 hours ago.  She denies any abdominal pain, bruising, nausea, vomiting.  Vital signs are normal.  Very well-appearing.  She is tender to the anterior portion of the right lower ribs.  Suspect clear breath sounds bilaterally.  There is no significant bruising.  I have no concern for intra-abdominal injury.  We will get an x-ray to evaluate for rib fracture versus contusion.  Will give lidocaine patch. ? ?X-ray per my review and interpretation shows rib fracture.  Radiology report states that this is the right anterior 10th rib.  There is no pneumothorax.  Overall patient appears very well.  Incentive spirometer given.  Recommend Tylenol, lidocaine patch, Flexeril,  Roxicodone.  Understands return precautions.  Discharged in good condition. ? ?This chart was dictated using voice recognition software.  Despite best efforts to proofread,  errors can occur which can change the documen

## 2021-10-25 NOTE — ED Triage Notes (Signed)
Patient here POV from Home. ? ?Endorses Falling yesterday PM while Moving a Table. Endorses Pain to Right/Mid Rib Cage/Torso. ? ?No Head Injury. No Neck Pain. No LOC. No Anticoagulants.  ? ?NAD Noted during Triage. A&Ox4. GCS 15. Ambulatory ?

## 2021-11-10 ENCOUNTER — Ambulatory Visit: Admission: RE | Admit: 2021-11-10 | Payer: Medicare Other | Source: Ambulatory Visit

## 2021-12-29 ENCOUNTER — Ambulatory Visit
Admission: RE | Admit: 2021-12-29 | Discharge: 2021-12-29 | Disposition: A | Discharge status: Home / Self Care | Payer: Medicare Other | Source: Ambulatory Visit | Attending: Family Medicine | Admitting: Family Medicine

## 2021-12-29 DIAGNOSIS — Z1231 Encounter for screening mammogram for malignant neoplasm of breast: Secondary | ICD-10-CM

## 2022-02-08 ENCOUNTER — Other Ambulatory Visit: Payer: Self-pay | Admitting: Pulmonary Disease

## 2022-03-13 ENCOUNTER — Other Ambulatory Visit: Payer: Self-pay | Admitting: Pulmonary Disease

## 2022-03-17 ENCOUNTER — Other Ambulatory Visit: Payer: Self-pay | Admitting: Pulmonary Disease

## 2022-04-17 ENCOUNTER — Other Ambulatory Visit: Payer: Self-pay | Admitting: *Deleted

## 2022-04-17 DIAGNOSIS — Z122 Encounter for screening for malignant neoplasm of respiratory organs: Secondary | ICD-10-CM

## 2022-04-17 DIAGNOSIS — Z87891 Personal history of nicotine dependence: Secondary | ICD-10-CM

## 2022-04-21 ENCOUNTER — Other Ambulatory Visit: Payer: Self-pay | Admitting: Pulmonary Disease

## 2022-04-21 ENCOUNTER — Other Ambulatory Visit: Payer: Self-pay | Admitting: Internal Medicine

## 2022-04-24 ENCOUNTER — Other Ambulatory Visit: Payer: Self-pay | Admitting: Pulmonary Disease

## 2022-05-16 ENCOUNTER — Encounter: Payer: Self-pay | Admitting: Acute Care

## 2022-05-16 ENCOUNTER — Ambulatory Visit (INDEPENDENT_AMBULATORY_CARE_PROVIDER_SITE_OTHER): Payer: Medicare Other | Admitting: Acute Care

## 2022-05-16 DIAGNOSIS — F17211 Nicotine dependence, cigarettes, in remission: Secondary | ICD-10-CM

## 2022-05-16 DIAGNOSIS — Z87891 Personal history of nicotine dependence: Secondary | ICD-10-CM

## 2022-05-16 NOTE — Progress Notes (Signed)
Virtual Visit via Telephone Note  I connected with Lindsay Porter on 05/16/22 at  2:30 PM EST by telephone and verified that I am speaking with the correct person using two identifiers.  Location: Patient: at home Provider: [address], [place], [place], [address]    I discussed the limitations, risks, security and privacy concerns of performing an evaluation and management service by telephone and the availability of in person appointments. I also discussed with the patient that there may be a patient responsible charge related to this service. The patient expressed understanding and agreed to proceed.   Shared Decision Making Visit Lung Cancer Screening Program [phone])   Eligibility: Age 81 y.o. Pack Years Smoking History Calculation 44 pack years (# packs/per year x # years smoked) Recent History of coughing up blood  no Unexplained weight loss? no ( >Than 15 pounds within the last 6 months ) Prior History Lung / other cancer no (Diagnosis within the last 5 years already requiring surveillance chest CT Scans). Smoking Status Former Smoker Former Smokers: Years since quit: 13 years  Quit Date: 2010  Visit Components: Discussion included one or more decision making aids. yes Discussion included risk/benefits of screening. yes Discussion included potential follow up diagnostic testing for abnormal scans. yes Discussion included meaning and risk of over diagnosis. yes Discussion included meaning and risk of False Positives. yes Discussion included meaning of total radiation exposure. yes  Counseling Included: Importance of adherence to annual lung cancer LDCT screening. yes Impact of comorbidities on ability to participate in the program. yes Ability and willingness to under diagnostic treatment. yes  Smoking Cessation Counseling: Current Smokers:  Discussed importance of smoking cessation. yes Information about tobacco cessation classes and interventions  provided to patient. yes Patient provided with "ticket" for LDCT Scan. yes Symptomatic Patient. no  Counseling NA Diagnosis Code: Tobacco Use Z72.0 Asymptomatic Patient yes  Counseling (Intermediate counseling: > three minutes counseling) C5885 Former Smokers:  Discussed the importance of maintaining cigarette abstinence. yes Diagnosis Code: Personal History of Nicotine Dependence. O27.741 Information about tobacco cessation classes and interventions provided to patient. Yes Patient provided with "ticket" for LDCT Scan. yes Written Order for Lung Cancer Screening with LDCT placed in Epic. Yes (CT Chest Lung Cancer Screening Low Dose W/O CM) [id number] Z12.2-Screening of respiratory organs Z87.891-Personal history of nicotine dependence  I spent 25 minutes of face to face time/virtual visit time  with  Lindsay Porter discussing the risks and benefits of lung cancer screening. We took the time to pause the power point at intervals to allow for questions to be asked and answered to ensure understanding. We discussed that she had taken the single most powerful action possible to decrease her risk of developing lung cancer when she quit smoking. I counseled her to remain smoke free, and to contact me if she ever had the desire to smoke again so that I can provide resources and tools to help support the effort to remain smoke free. We discussed the time and location of the scan, and that either  Lindsay Glassman RN, Joella Prince, RN or I  or I will call / send a letter with the results within  24-72 hours of receiving them. She has the office contact information in the event she needs to speak with me,  she verbalized understanding of all of the above and had no further questions upon leaving the office.     I explained to the patient that there has been a high  incidence of coronary artery disease noted on these exams. I explained that this is a non-gated exam therefore degree or severity cannot be  determined. This patient is  not on statin therapy. I have asked the patient to follow-up with their PCP regarding any incidental finding of coronary artery disease and management with diet or medication as they feel is clinically indicated. The patient verbalized understanding of the above and had no further questions.     Magdalen Spatz, NP 05/16/2022

## 2022-05-16 NOTE — Patient Instructions (Signed)
Thank you for participating in the [facility]. It was our pleasure to meet you today. We will call you with the results of your scan within the next few days. Your scan will be assigned a Lung RADS category score by the physicians reading the scans.  This Lung RADS score determines follow up scanning.  See below for description of categories, and follow up screening recommendations. We will be in touch to schedule your follow up screening annually or based on recommendations of our providers. We will fax a copy of your scan results to your Primary Care Physician, or the physician who referred you to the program, to ensure they have the results. Please call the office if you have any questions or concerns regarding your scanning experience or results.  Our office number is [phone]. Please speak with Denise Phelps, RN. , or  Denise Buckner RN, They are  our Lung Cancer Screening RN.'s If They are unavailable when you call, Please leave a message on the voice mail. We will return your call at our earliest convenience.This voice mail is monitored several times a day.  Remember, if your scan is normal, we will scan you annually as long as you continue to meet the criteria for the program. (Age 55-77, Current smoker or smoker who has quit within the last 15 years). If you are a smoker, remember, quitting is the single most powerful action that you can take to decrease your risk of lung cancer and other pulmonary, breathing related problems. We know quitting is hard, and we are here to help.  Please let us know if there is anything we can do to help you meet your goal of quitting. If you are a former smoker, congratulations. We are proud of you! Remain smoke free! Remember you can refer friends or family members through the number above.  We will screen them to make sure they meet criteria for the program. Thank you for helping us take better care of you by  participating in Lung Screening.  You can receive free nicotine replacement therapy ( patches, gum or mints) by calling 1-800-QUIT NOW. Please call so we can get you on the path to becoming  a non-smoker. I know it is hard, but you can do this!  Lung RADS Categories:  Lung RADS 1: no nodules or definitely non-concerning nodules.  Recommendation is for a repeat annual scan in 12 months.  Lung RADS 2:  nodules that are non-concerning in appearance and behavior with a very low likelihood of becoming an active cancer. Recommendation is for a repeat annual scan in 12 months.  Lung RADS 3: nodules that are probably non-concerning , includes nodules with a low likelihood of becoming an active cancer.  Recommendation is for a 6-month repeat screening scan. Often noted after an upper respiratory illness. We will be in touch to make sure you have no questions, and to schedule your 6-month scan.  Lung RADS 4 A: nodules with concerning findings, recommendation is most often for a follow up scan in 3 months or additional testing based on our provider's assessment of the scan. We will be in touch to make sure you have no questions and to schedule the recommended 3 month follow up scan.  Lung RADS 4 B:  indicates findings that are concerning. We will be in touch with you to schedule additional diagnostic testing based on our provider's  assessment of the scan.  Other options for assistance in smoking cessation (   As covered by your insurance benefits)  Hypnosis for smoking cessation  [employer] [phone]  Acupuncture for smoking cessation  [facility] [phone]   

## 2022-05-19 ENCOUNTER — Other Ambulatory Visit: Payer: Medicare Other

## 2022-05-19 ENCOUNTER — Ambulatory Visit
Admission: RE | Admit: 2022-05-19 | Discharge: 2022-05-19 | Disposition: A | Discharge status: Home / Self Care | Payer: Medicare Other | Source: Ambulatory Visit | Attending: Acute Care | Admitting: Acute Care

## 2022-05-19 DIAGNOSIS — Z122 Encounter for screening for malignant neoplasm of respiratory organs: Secondary | ICD-10-CM

## 2022-05-19 DIAGNOSIS — Z87891 Personal history of nicotine dependence: Secondary | ICD-10-CM

## 2022-05-22 ENCOUNTER — Other Ambulatory Visit: Payer: Self-pay | Admitting: Acute Care

## 2022-05-22 DIAGNOSIS — Z122 Encounter for screening for malignant neoplasm of respiratory organs: Secondary | ICD-10-CM

## 2022-05-22 DIAGNOSIS — Z87891 Personal history of nicotine dependence: Secondary | ICD-10-CM

## 2022-06-25 IMAGING — RF DG SWALLOWING FUNCTION
12 series · 12 of 24 positions shown · non-contrast
Comparison: None.

CLINICAL DATA: For globus sensation.

EXAM:
MODIFIED BARIUM SWALLOW
TECHNIQUE: Different consistencies of barium were administered orally to the
patient by the Speech Pathologist. Imaging of the pharynx was
performed in the lateral projection. The radiologist was present in
the fluoroscopy room for this study, providing personal supervision.
FLUOROSCOPY TIME:  Fluoroscopy Time:  1 minutes 10 seconds
Radiation Exposure Index (if provided by the fluoroscopic device):
14.1 mGy
Number of Acquired Spot Images: 0

[Series 1: run · 1 of 75 frames shown (1 of 12)]
[frame 38/75]
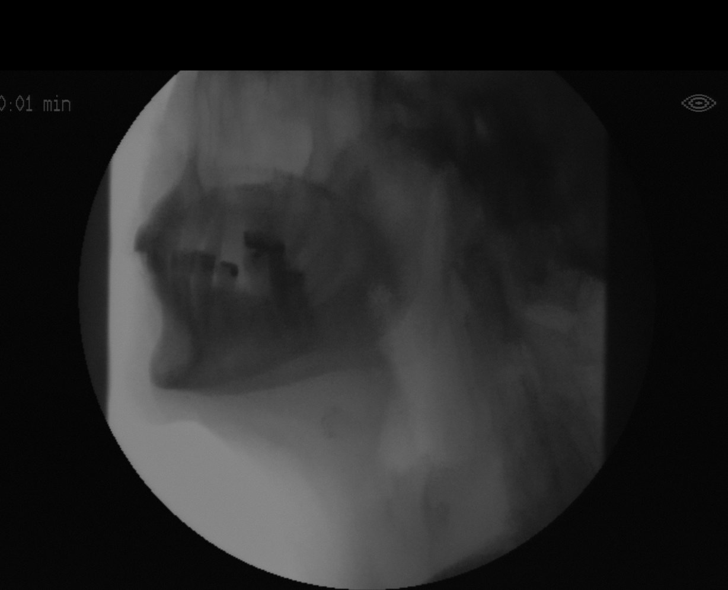

[Series 2: run · 1 of 152 frames shown (2 of 12)]
[frame 100/152]
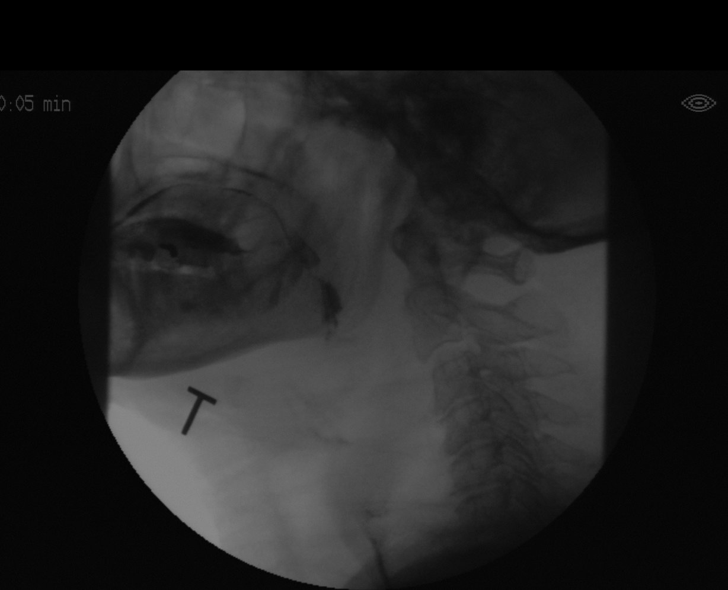

[Series 3: run · 1 of 65 frames shown (3 of 12)]
[frame 56/65]
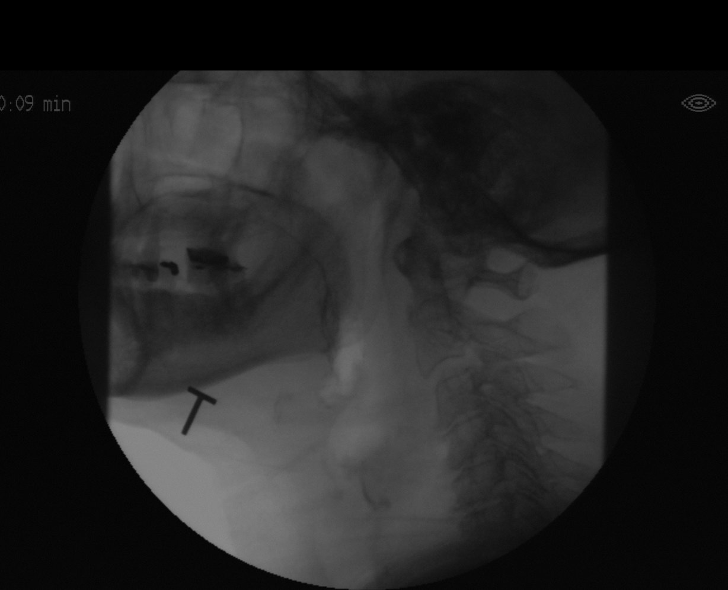

[Series 4: run · 1 of 185 frames shown (4 of 12)]
[frame 158/185]
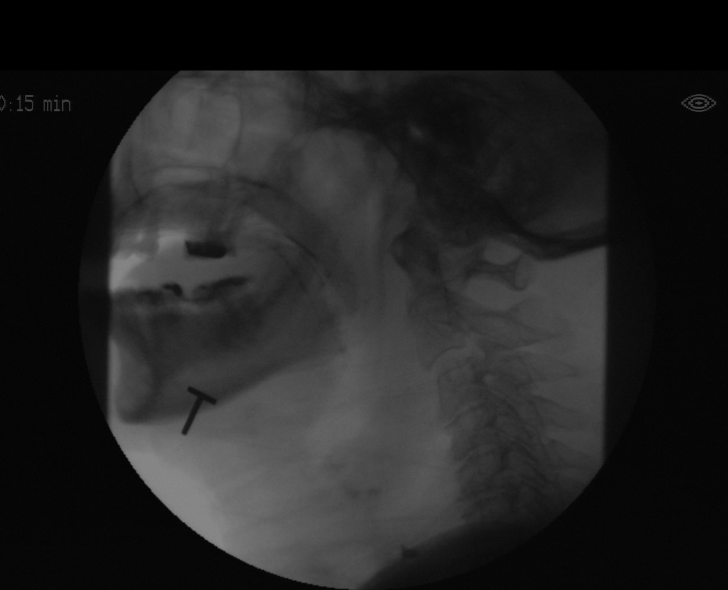

[Series 5: run · 1 of 245 frames shown (5 of 12)]
[frame 209/245]
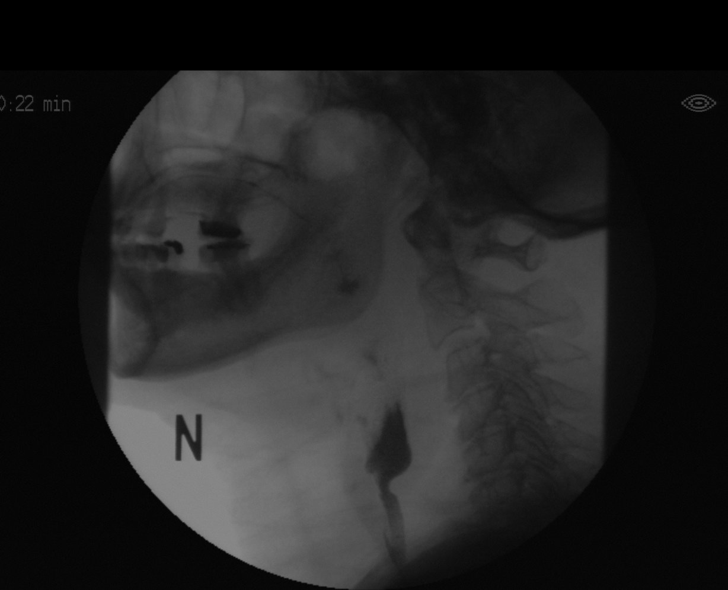

[Series 6: run · 1 of 370 frames shown (6 of 12)]
[frame 317/370]
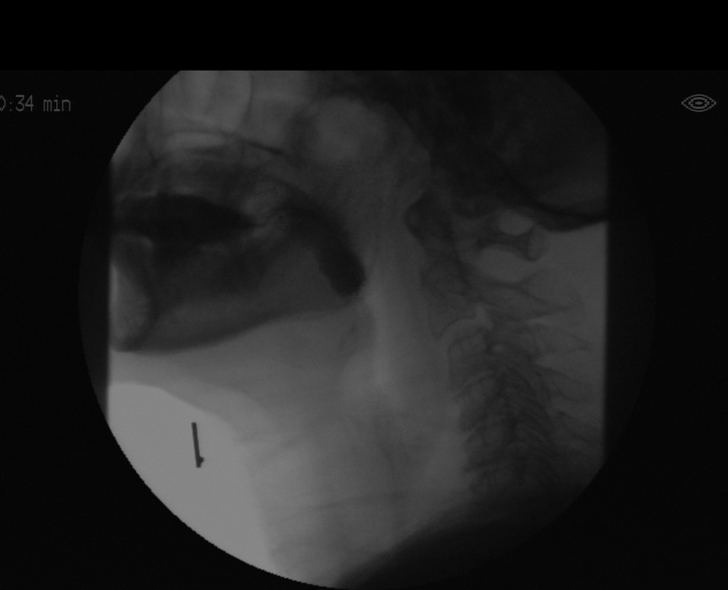

[Series 7: run · 1 of 66 frames shown (7 of 12)]
[frame 57/66]
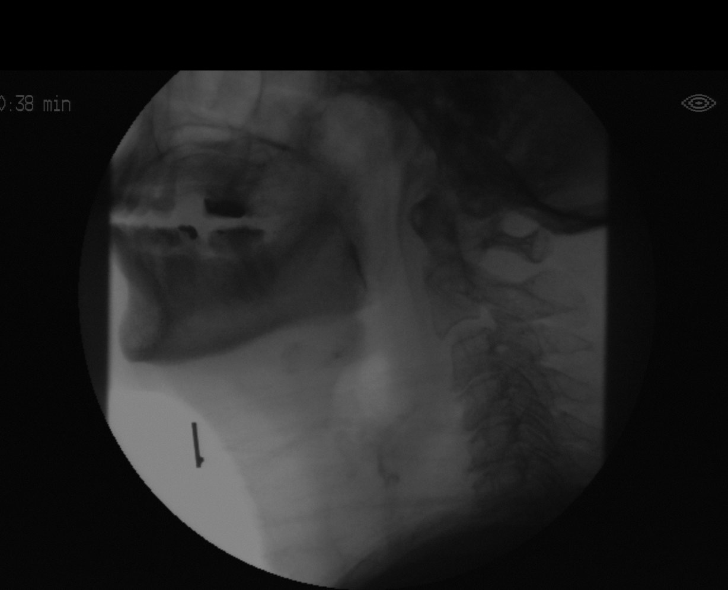

[Series 8: run · 1 of 117 frames shown (8 of 12)]
[frame 114/117]
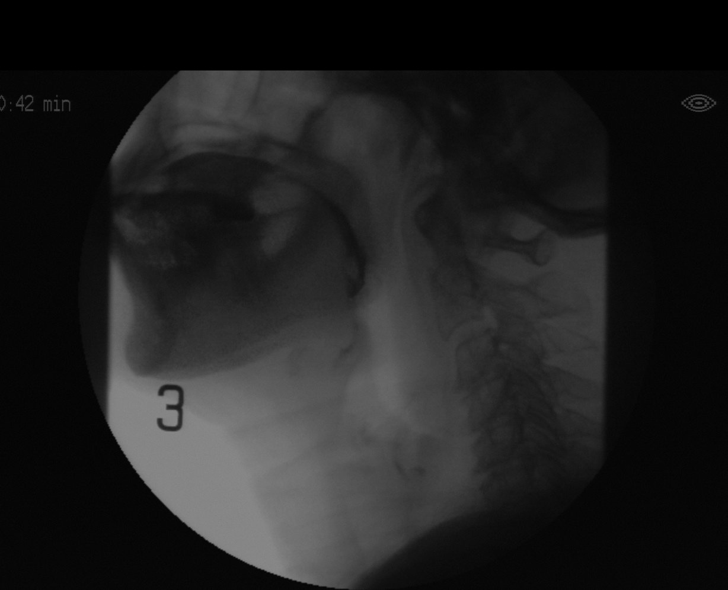

[Series 9: run · 1 of 82 frames shown (9 of 12)]
[frame 82/82]
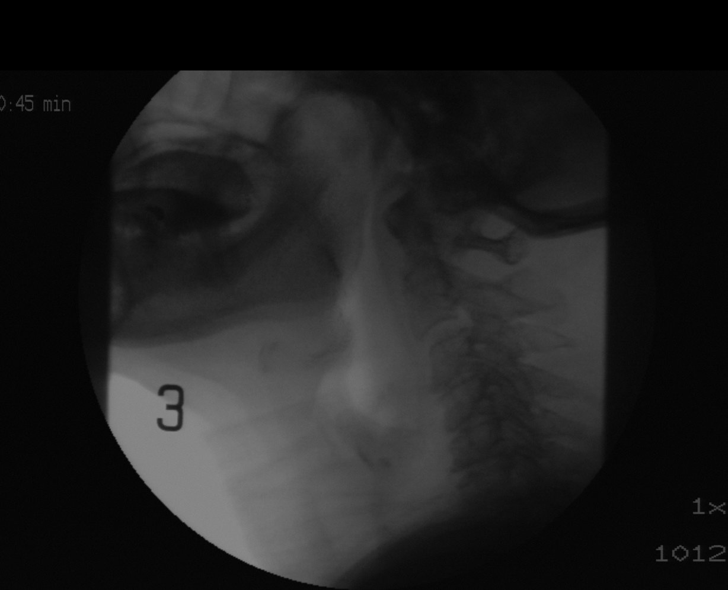

[Series 10: run · 1 of 195 frames shown (10 of 12)]
[frame 166/195]
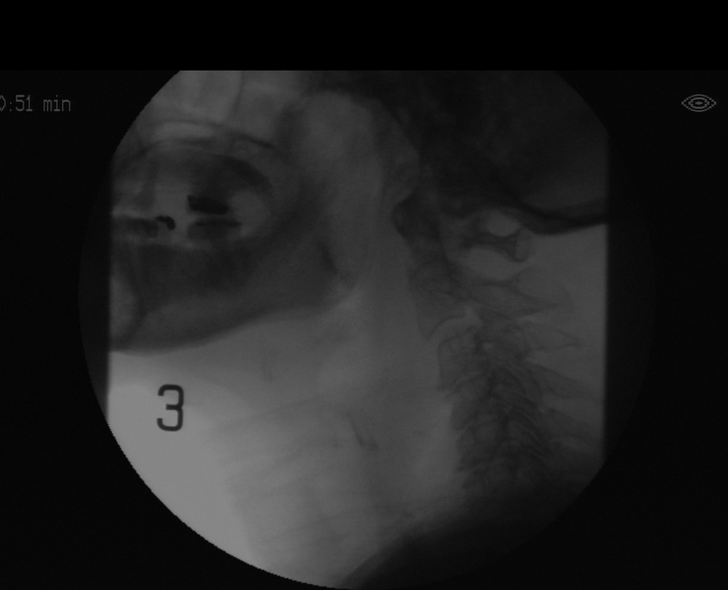

[Series 11: run · 1 of 439 frames shown (11 of 12)]
[frame 374/439]
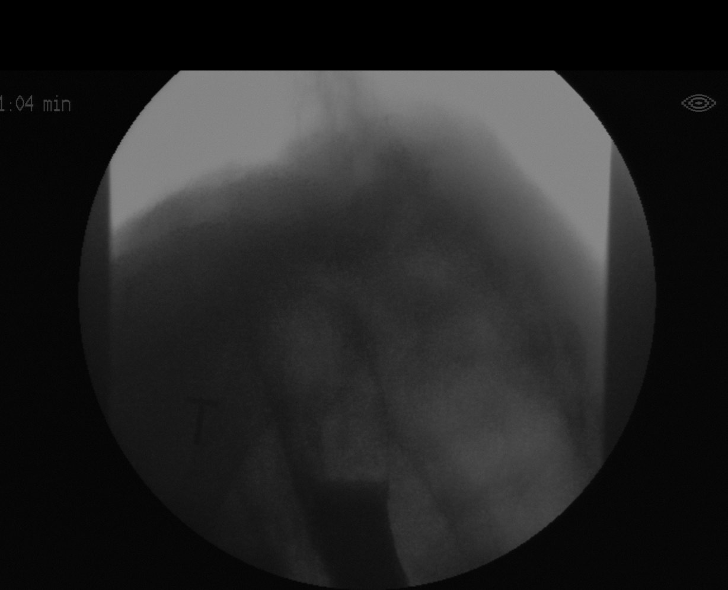

[Series 12: run · 1 of 134 frames shown (12 of 12)]
[frame 114/134]
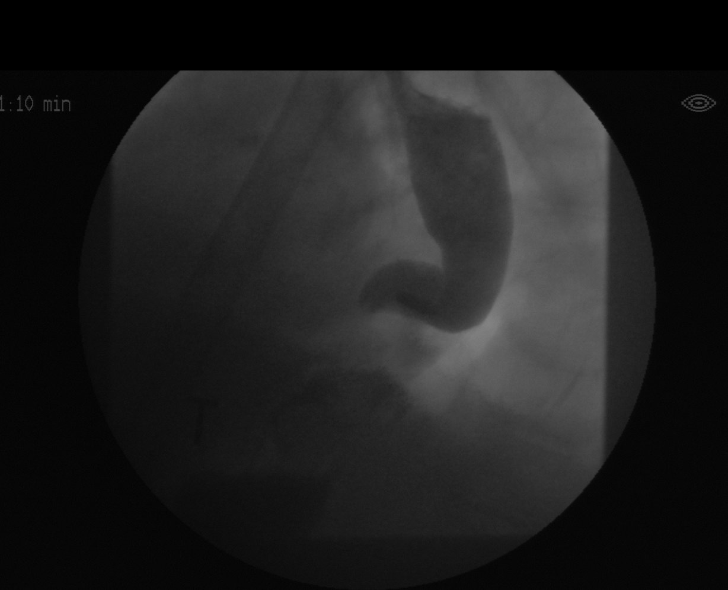

[12 of 24 positions shown; findings below may reference images not displayed]

FINDINGS: With thin liquids, nectar thickness, puree thickness, and cracker
within puree, no evidence of swallow dysfunction. A 13 mm barium
tablet passes the oropharynx. Delayed passage in the distal
esophagus with suboptimally evaluated esophageal dilatation and
suggestion of contrast stasis.
IMPRESSION: 1. No evidence of swallow dysfunction.
2. Findings suggesting esophageal dysmotility, incompletely
evaluated. Consider esophagram.

Please refer to the Speech Pathologists report for complete details
and recommendations.

## 2022-07-03 DIAGNOSIS — J189 Pneumonia, unspecified organism: Secondary | ICD-10-CM

## 2022-07-03 HISTORY — DX: Pneumonia, unspecified organism: J18.9

## 2022-08-25 ENCOUNTER — Ambulatory Visit: Payer: Self-pay | Admitting: Surgery

## 2022-08-25 NOTE — H&P (Signed)
Subjective    Chief Complaint: Inguinal Hernia       History of Present Illness: Lindsay Porter is a 82 y.o. female who is seen today as an office consultation at the request of Dr. Pasty Arch for evaluation of Inguinal Hernia .     This is a 82 year old female who is status post right inguinal hernia repair with mesh on 06/04/2014 for a large direct defect.  Over the last year, the patient has palpated a "egg sized" mass in the left lower quadrant.  This causes no discomfort.  It has not enlarged.  In the last several weeks, the patient has developed a burning sensation as well as a large bulge in her left groin.  This remains reducible.  She denies any change in her bowel habits.   The patient has stable COPD and is no longer seeing pulmonology on a regular basis.  She remains in the lung cancer screening program.     Review of Systems: A complete review of systems was obtained from the patient.  I have reviewed this information and discussed as appropriate with the patient.  See HPI as well for other ROS.   Review of Systems  Constitutional: Negative.   HENT: Negative.    Eyes: Negative.   Respiratory:  Positive for shortness of breath and wheezing.   Cardiovascular: Negative.   Gastrointestinal: Negative.   Genitourinary: Negative.   Musculoskeletal: Negative.   Skin:  Positive for itching.  Neurological: Negative.   Endo/Heme/Allergies: Negative.   Psychiatric/Behavioral:  Positive for memory loss.         Medical History: Past Medical History      Past Medical History:  Diagnosis Date   Anemia     Arthritis     COPD (chronic obstructive pulmonary disease) (CMS-HCC)     GERD (gastroesophageal reflux disease)     Substance abuse (CMS-HCC)             Patient Active Problem List  Diagnosis   Allergic rhinitis   Anemia, iron deficiency   Chronic obstructive pulmonary disease, unspecified (CMS-HCC)   Cough   Depression   Diverticulosis   Dyspnea   Fatigue    Gastroesophageal reflux disease   Hyperlipidemia   Primary osteoarthritis involving multiple joints   Prediabetes      Past Surgical History       Past Surgical History:  Procedure Laterality Date   ENDOSCOPIC CARPAL TUNNEL RELEASE       JOINT REPLACEMENT       REPAIR HIATAL HERNIA       REPAIR INGUINAL HERNIA            Allergies       Allergies  Allergen Reactions   Montelukast Hives   Sulfa (Sulfonamide Antibiotics) Other (See Comments)      As child              Current Outpatient Medications on File Prior to Visit  Medication Sig Dispense Refill   albuterol 90 mcg/actuation inhaler Inhale into the lungs every 6 (six) hours as needed       budesonide-formoteroL (SYMBICORT) 160-4.5 mcg/actuation inhaler INHALE 2 PUFFS BY MOUTH TWICE A DAY 30       fluticasone propionate (FLONASE) 50 mcg/actuation nasal spray Administer 2 sprays in each nostril daily.       meloxicam (MOBIC) 15 MG tablet Take 1 tablet by mouth once daily       pantoprazole (PROTONIX) 40 MG DR tablet  sertraline (ZOLOFT) 100 MG tablet Take 1 tablet by mouth once daily       cyanocobalamin (VITAMIN B12) 1000 MCG tablet Take by mouth       ferrous sulfate 325 (65 FE) MG tablet Take 325 mg by mouth daily with breakfast       gabapentin (NEURONTIN) 300 MG capsule Take 2 capsules by mouth at bedtime       loratadine (CLARITIN) 10 mg tablet Take by mouth       pramipexole (MIRAPEX) 0.5 MG tablet Take 1 tablet by mouth once daily        No current facility-administered medications on file prior to visit.      Family History  History reviewed. No pertinent family history.      Social History        Tobacco Use  Smoking Status Former   Types: Cigarettes  Smokeless Tobacco Never      Social History  Social History         Socioeconomic History   Marital status: Married  Tobacco Use   Smoking status: Former      Types: Cigarettes   Smokeless tobacco: Never  Vaping Use   Vaping Use:  Never used  Substance and Sexual Activity   Alcohol use: Yes   Drug use: Never        Objective:         Vitals:    08/25/22 0917  BP: 122/84  Pulse: 89  Temp: 36.4 C (97.6 F)  SpO2: 92%  Weight: 76.2 kg (168 lb)  Height: 167.6 cm ('5\' 6"'$ )  PainSc: 0-No pain    Body mass index is 27.12 kg/m.   Physical Exam    Constitutional:  WDWN in NAD, conversant, no obvious deformities; lying in bed comfortably Eyes:  Pupils equal, round; sclera anicteric; moist conjunctiva; no lid lag HENT:  Oral mucosa moist; good dentition  Neck:  No masses palpated, trachea midline; no thyromegaly Lungs:  CTA bilaterally; normal respiratory effort Breasts:  symmetric, no nipple changes; no palpable masses or lymphadenopathy on either side CV:  Regular rate and rhythm; no murmurs; extremities well-perfused with no edema Abd:  +bowel sounds, soft, non-tender, no palpable organomegaly; no palpable hernias; left lower quadrant shows a 4 x 2 cm subcutaneous mass in the left lower quadrant.  This does not enlarge with Valsalva maneuver.  It is not reducible.  This likely represents a small subcutaneous lipoma. The right inguinal incision is well-healed.  No sign of right inguinal hernia The patient has a moderate-sized reducible left inguinal hernia.  It is easily palpated when standing with Valsalva maneuver.  It spontaneously reduces when she is supine. Musc:  Slow steady gait; no apparent clubbing or cyanosis in extremities Lymphatic:  No palpable cervical or axillary lymphadenopathy Skin:  Warm, dry; no sign of jaundice Psychiatric - alert and oriented x 4; calm mood and affect     Labs, Imaging and Diagnostic Testing: Retrospective review of a CT scan of the abdomen and pelvis performed in 2020 shows that she likely had a small left inguinal hernia at that time.   Assessment and Plan:  Diagnoses and all orders for this visit:   Left inguinal hernia       We will touch base with pulmonology  to make sure that they do not have any concerns about general anesthesia in this patient.  We will perform her surgery at [facility] with plans to discharge same day.  However if  she has any pulmonary issues, she can be kept overnight and pulmonary will be consulted.   Recommend left inguinal hernia repair with mesh.  The surgical procedure has been discussed with the patient.  Potential risks, benefits, alternative treatments, and expected outcomes have been explained.  All of the patient's questions at this time have been answered.  The likelihood of reaching the patient's treatment goal is good.  The patient understand the proposed surgical procedure and wishes to proceed.       Ambriel Gorelick Jearld Adjutant, MD  08/25/2022 10:03 AM

## 2022-09-01 ENCOUNTER — Telehealth: Payer: Self-pay | Admitting: Pulmonary Disease

## 2022-09-01 NOTE — Telephone Encounter (Signed)
Fax received from Dr. Donnie Mesa with [facility] to perform a [place] on patient.  Patient needs surgery clearance. Surgery is PENDING. Patient was seen on 05/16/2022. Office protocol is a risk assessment can be sent to surgeon if patient has been seen in 60 days or less.   Sending to Dr Erin Fulling for risk assessment or recommendations if patient needs to be seen in office prior to surgical procedure.    Called patient and got her scheduled for an overdue follow up with Dr Erin Fulling on 09/19/2022.

## 2022-09-02 NOTE — Telephone Encounter (Signed)
Thanks.  JD

## 2022-09-19 ENCOUNTER — Encounter: Payer: Self-pay | Admitting: Pulmonary Disease

## 2022-09-19 ENCOUNTER — Ambulatory Visit: Payer: Medicare Other | Admitting: Pulmonary Disease

## 2022-09-19 VITALS — BP 124/72 | HR 69 | Ht 66.0 in | Wt 164.0 lb

## 2022-09-19 DIAGNOSIS — J449 Chronic obstructive pulmonary disease, unspecified: Secondary | ICD-10-CM | POA: Diagnosis not present

## 2022-09-19 NOTE — Progress Notes (Signed)
Synopsis: Referred in January 2022 for abnormal chest radiograph  Subjective:   PATIENT ID: Lindsay Porter GENDER: female DOB: May 26, 1941, MRN: [id number]   HPI  Chief Complaint  Patient presents with   Follow-up    Surgical clearance for hernia repair surgery. States her breathing has been stable since last visit.    Lindsay Porter is an 82 year old woman, former smoker with COPD who returns to pulmonary clinic for follow up.  She is having left inguinal hernia repair 4/10 and needs pre-op respiratory evaluation. She remains on symbicort 160-4.74mcg 2 puffs twice daily. She has not required prednisone in the past year. She was treated for pneumonia in 03/2022 with azithromycin and augmentin. She has no increase in dyspnea, wheezing or cough.  OV 01/27/21 CT Chest 01/11/21 did not show any changes of the chronic scarring of the bilateral bases. There is thickening of the distal esophagus. She is currently following with GI and had EGD done 12/2020. She was also evaluated by ENT 01/20/21 with concern for chronic clearing of her throat. Flexible laryngoscopy with moderate interarytenoid edema and erythema consistent with laryngopharyngeal reflux. Normal vocal cord mobility.   She denies issues of shortness of breath and wheezing.  OV 08/11/20 She was previously followed by Dr. Lake Bells. She remains on symbicort 80-4.24mcg 2 puffs twice daily with no issues reported in her breathing. She does complain of cough, sinus congestion and runny nose.    She complains of dysphagia when eating solid foods where the food gets stuck in her throat.   She had a chest radiograph done 05/31/20 to follow up a left lower lobe and lingular lesion noted on a chest radiograph from 01/08/2019 after a fall. There was no change in the left lower lobe or lingular airspace opacity.   Past Medical History:  Diagnosis Date   Alcohol abuse    Allergy    Anemia, iron deficiency    Arthritis    Carpal tunnel  syndrome    COPD (chronic obstructive pulmonary disease) (HCC)    Depression    Diverticulosis    GERD (gastroesophageal reflux disease)    Hiatal hernia    Hyperlipidemia    Insomnia    Osteopenia    Ovarian cyst    Restless leg syndrome    Seasonal allergies    Vitamin D deficiency    Wears contact lenses      Family History  Problem Relation Age of Onset   Rheum arthritis Mother    Asthma Mother    Alcoholism Father    Arthritis Sister    Colon cancer Neg Hx    Colon polyps Neg Hx    Esophageal cancer Neg Hx    Pancreatic cancer Neg Hx    Stomach cancer Neg Hx    Rectal cancer Neg Hx    Breast cancer Neg Hx      Social History   Socioeconomic History   Marital status: Married    Spouse name: Not on file   Number of children: Not on file   Years of education: Not on file   Highest education level: Not on file  Occupational History   Occupation: RETIRED  Tobacco Use   Smoking status: Former    Packs/day: 2.00    Years: 45.00    Additional pack years: 0.00    Total pack years: 90.00    Types: Cigarettes    Quit date: 10/02/2007    Years since quitting: 14.9   Smokeless tobacco:  Never  Vaping Use   Vaping Use: Never used  Substance and Sexual Activity   Alcohol use: Not Currently    Comment: 6 months sober 4-0-2022   Drug use: No   Sexual activity: Not on file  Other Topics Concern   Not on file  Social History Narrative   Not on file   Social Determinants of Health   Financial Resource Strain: Not on file  Food Insecurity: Not on file  Transportation Needs: Not on file  Physical Activity: Not on file  Stress: Not on file  Social Connections: Not on file  Intimate Partner Violence: Not on file     Allergies  Allergen Reactions   Singulair [Montelukast Sodium] Hives   Sulfa Antibiotics Other (See Comments)    Childhood reaction      Outpatient Medications Prior to Visit  Medication Sig Dispense Refill   acetaminophen (TYLENOL) 500 MG  tablet 500 mg every 6 (six) hours as needed.     albuterol (VENTOLIN HFA) 108 (90 Base) MCG/ACT inhaler Inhale 2 puffs into the lungs every 6 (six) hours as needed.     alendronate (FOSAMAX) 70 MG tablet Take 70 mg by mouth once a week.     budesonide-formoterol (SYMBICORT) 160-4.5 MCG/ACT inhaler INHALE 2 PUFFS INTO THE LUNGS TWICE DAILY 10.2 g 0   cetirizine (ZYRTEC) 10 MG tablet Take 1 tablet (10 mg total) by mouth daily. 30 tablet 0   Cholecalciferol (VITAMIN D) 50 MCG (2000 UT) tablet Take 2,000 Units by mouth daily.     ferrous sulfate 325 (65 FE) MG tablet Take 325 mg by mouth daily at 2 PM.     fluticasone (FLONASE) 50 MCG/ACT nasal spray Place 2 sprays into both nostrils daily.     gabapentin (NEURONTIN) 300 MG capsule Take 2 capsules by mouth at bedtime.     meloxicam (MOBIC) 15 MG tablet Mobic 15 mg tablet  1 tablet daily     pantoprazole (PROTONIX) 40 MG tablet Take 1 tablet (40 mg total) by mouth 2 (two) times daily before a meal. Office visit for further refills 180 tablet 0   pramipexole (MIRAPEX) 0.5 MG tablet Take 1 tablet by mouth daily.     sertraline (ZOLOFT) 100 MG tablet Take 1 tablet by mouth daily.     amphetamine-dextroamphetamine (ADDERALL) 20 MG tablet Take 20 mg by mouth daily.     oxyCODONE-acetaminophen (PERCOCET) 5-325 MG tablet Take 1 tablet by mouth every 4 (four) hours as needed for severe pain. 20 tablet 0   No facility-administered medications prior to visit.    Review of Systems  Constitutional:  Negative for chills, fever, malaise/fatigue and weight loss.  HENT:  Negative for congestion, sinus pain and sore throat.   Eyes: Negative.   Respiratory:  Negative for cough, hemoptysis, sputum production, shortness of breath and wheezing.   Cardiovascular:  Negative for chest pain, palpitations, orthopnea, claudication and leg swelling.  Gastrointestinal:  Negative for abdominal pain, heartburn, nausea and vomiting.  Genitourinary: Negative.    Musculoskeletal:  Negative for joint pain and myalgias.  Skin:  Negative for rash.  Neurological:  Negative for weakness.  Endo/Heme/Allergies: Negative.   Psychiatric/Behavioral: Negative.     Objective:   Vitals:   09/19/22 1355  BP: 124/72  Pulse: 69  SpO2: 96%  Weight: 164 lb (74.4 kg)  Height: 5\' 6"  (1.676 m)   Physical Exam Constitutional:      General: She is not in acute distress.    Appearance:  Normal appearance. She is not ill-appearing.  HENT:     Head: Normocephalic and atraumatic.  Eyes:     General: No scleral icterus.    Conjunctiva/sclera: Conjunctivae normal.  Cardiovascular:     Rate and Rhythm: Normal rate and regular rhythm.     Pulses: Normal pulses.     Heart sounds: Normal heart sounds. No murmur heard. Pulmonary:     Effort: Pulmonary effort is normal.     Breath sounds: Normal breath sounds. No wheezing, rhonchi or rales.  Musculoskeletal:     Right lower leg: No edema.     Left lower leg: No edema.  Skin:    General: Skin is warm and dry.  Neurological:     General: No focal deficit present.     Mental Status: She is alert.    CBC    Component Value Date/Time   WBC 7.0 01/09/2019 0424   RBC 4.50 01/09/2019 0424   HGB 13.8 01/09/2019 0424   HCT 43.3 01/09/2019 0424   PLT 184 01/09/2019 0424   MCV 96.2 01/09/2019 0424   MCH 30.7 01/09/2019 0424   MCHC 31.9 01/09/2019 0424   RDW 13.3 01/09/2019 0424   LYMPHSABS 1,624 10/28/2015 1234   MONOABS 336 10/28/2015 1234   EOSABS 56 10/28/2015 1234   BASOSABS 56 10/28/2015 1234   Chest imaging: CT Chest LCS 05/19/22 1. Lung-RADS 2, benign appearance or behavior. Continue annual screening with low-dose chest CT without contrast in 12 months. 2. Coronary artery calcifications, aortic Atherosclerosis (ICD10-I70.0) and Emphysema (ICD10-J43.9).  CT Chest 01/11/21 Mild scarring at the lung bases.   Moderate hiatal hernia with esophageal wall thickening. Present previously and may reflect  reflux esophagitis.   Coronary artery and thoracic aortic calcification.  CXR 05/31/20 1. Chronic airspace opacity along the left hemidiaphragm, favoring lingular and left lower lobe scarring. Appearance not appreciably changed from 01/08/2019. 2. Degenerative glenohumeral arthropathy, left greater than right. 3. Aortic Atherosclerosis (ICD10-I70.0).  CT Chest 01/09/2019 Mediastinum/Nodes: Moderate hiatal hernia. No adenopathy. Thyroid gland and trachea appear normal.   Lungs/Pleura: There is linear atelectasis at both lung bases primarily posteriorly. No mass lesions or infiltrates or effusions.  PFT:     No data to display           MBS 08/17/20 Patient presents with a grossly functional/minimal oropharyngeal  dysphagia and suspected primary esophageal dysphagia. Pt with some lingual pumping of oral boluses and required multiple swallows to clear oral cavity. Airway protection was excellent without laryngeal penetration or tracheal aspiration. Mild vallecular and pyriform sinus residuals noted with thin liquids that cleared with second reflexive swallows. Pt with cricopharyngeal prominence. Upon esopahgeal sweep, barium retention noted . Pt with report of globus sensation in throat of tablet despite barium tablet clearing pharynx and cervical esophagus (suspect referred sensation). Recommend GI consult with esophageal workup for globus sensation, progressive hoarseness and faily unremarkable MBSS. Continue regular thin liquid diet.  Assessment & Plan:   Chronic obstructive pulmonary disease, unspecified COPD type ([place])  Discussion: Lindsay Porter is a 82 year old woman, former smoker with COPD who returns to pulmonary clinic for follow up.   Her COPD continues to be well controlled at this time. She is to continue symbicort 160-4.51mcg 2 puffs twice daily. After her surgery, we can consider reducing her inhaler dose.   She is low to intermediate risk for post-operative  respiratory complications based on ARISCAT risk index, 1.6% to 13.3% risk of in-hospital post-op pulmonary complications (composite including respiratory  failure, respiratory infection, pleural effusion, atelectasis, pneumothorax, bronchospasm, aspiration pneumonitis).   Follow up in 1 year.  Freda Jackson, MD [facility] Office: [phone]    Current Outpatient Medications:    acetaminophen (TYLENOL) 500 MG tablet, 500 mg every 6 (six) hours as needed., Disp: , Rfl:    albuterol (VENTOLIN HFA) 108 (90 Base) MCG/ACT inhaler, Inhale 2 puffs into the lungs every 6 (six) hours as needed., Disp: , Rfl:    alendronate (FOSAMAX) 70 MG tablet, Take 70 mg by mouth once a week., Disp: , Rfl:    budesonide-formoterol (SYMBICORT) 160-4.5 MCG/ACT inhaler, INHALE 2 PUFFS INTO THE LUNGS TWICE DAILY, Disp: 10.2 g, Rfl: 0   cetirizine (ZYRTEC) 10 MG tablet, Take 1 tablet (10 mg total) by mouth daily., Disp: 30 tablet, Rfl: 0   Cholecalciferol (VITAMIN D) 50 MCG (2000 UT) tablet, Take 2,000 Units by mouth daily., Disp: , Rfl:    ferrous sulfate 325 (65 FE) MG tablet, Take 325 mg by mouth daily at 2 PM., Disp: , Rfl:    fluticasone (FLONASE) 50 MCG/ACT nasal spray, Place 2 sprays into both nostrils daily., Disp: , Rfl:    gabapentin (NEURONTIN) 300 MG capsule, Take 2 capsules by mouth at bedtime., Disp: , Rfl:    meloxicam (MOBIC) 15 MG tablet, Mobic 15 mg tablet  1 tablet daily, Disp: , Rfl:    pantoprazole (PROTONIX) 40 MG tablet, Take 1 tablet (40 mg total) by mouth 2 (two) times daily before a meal. Office visit for further refills, Disp: 180 tablet, Rfl: 0   pramipexole (MIRAPEX) 0.5 MG tablet, Take 1 tablet by mouth daily., Disp: , Rfl:    sertraline (ZOLOFT) 100 MG tablet, Take 1 tablet by mouth daily., Disp: , Rfl:

## 2022-09-19 NOTE — Patient Instructions (Signed)
Continue symbicort inhaler 2 puffs twice daily - rinse mouth out after each use  Let us know after your surgery when you are ready to transition to the lower dose symbicort inhaler and we will send in updated script  Use albuterol inhaler as needed  Follow up in 1 year

## 2022-09-20 NOTE — Telephone Encounter (Signed)
OV notes and clearance form have been faxed back to [facility]. Nothing further needed at this time.

## 2022-10-02 NOTE — Pre-Procedure Instructions (Signed)
Surgical Instructions    Your procedure is scheduled on October 11, 2022.  Report to [facility] Main Entrance "A" at 6:30 A.M., then check in with the Admitting office.  Call this number if you have problems the morning of surgery:  [phone]  If you have any questions prior to your surgery date call [phone]: Open Monday-Friday 8am-4pm If you experience any cold or flu symptoms such as cough, fever, chills, shortness of breath, etc. between now and your scheduled surgery, please notify us at the above number.     Remember:  Do not eat after midnight the night before your surgery  You may drink clear liquids until 5:30 AM the morning of your surgery.   Clear liquids allowed are: Water, Non-Citrus Juices (without pulp), Carbonated Beverages, Clear Tea, Black Coffee Only (NO MILK, CREAM OR POWDERED CREAMER of any kind), and Gatorade.  Patient Instructions  The night before surgery:  No food after midnight. ONLY clear liquids after midnight  The day of surgery (if you do NOT have diabetes):  Drink ONE (1) Pre-Surgery Clear Ensure by 5:30 AM the morning of surgery. Drink in one sitting. Do not sip.  This drink was given to you during your hospital  pre-op appointment visit.  Nothing else to drink after completing the  Pre-Surgery Clear Ensure.         If you have questions, please contact your surgeon's office.     Take these medicines the morning of surgery with A SIP OF WATER:  budesonide-formoterol (SYMBICORT) inhaler   cetirizine (ZYRTEC)   fluticasone (FLONASE) nasal spray   pantoprazole (PROTONIX)   pramipexole (MIRAPEX)   sertraline (ZOLOFT)    May take these medicines IF NEEDED:  acetaminophen (TYLENOL)   albuterol (VENTOLIN HFA) inhaler    As of today, STOP taking any Aspirin (unless otherwise instructed by your surgeon) Aleve, Naproxen, Ibuprofen, Motrin, Advil, Goody's, BC's, all herbal medications, fish oil, and all vitamins. This includes your medication:  meloxicam (MOBIC)                      Do NOT Smoke (Tobacco/Vaping) for 24 hours prior to your procedure.  If you use a CPAP at night, you may bring your mask/headgear for your overnight stay.   Contacts, glasses, piercing's, hearing aid's, dentures or partials may not be worn into surgery, please bring cases for these belongings.    For patients admitted to the hospital, discharge time will be determined by your treatment team.   Patients discharged the day of surgery will not be allowed to drive home, and someone needs to stay with them for 24 hours.  SURGICAL WAITING ROOM VISITATION Patients having surgery or a procedure may have no more than 2 support people in the waiting area - these visitors may rotate.   Children under the age of 40 must have an adult with them who is not the patient. If the patient needs to stay at the hospital during part of their recovery, the visitor guidelines for inpatient rooms apply. Pre-op nurse will coordinate an appropriate time for 1 support person to accompany patient in pre-op.  This support person may not rotate.   Please refer to the [facility] website for the visitor guidelines for Inpatients (after your surgery is over and you are in a regular room).    Special instructions:   [place]- Preparing For Surgery  Before surgery, you can play an important role. Because skin is not sterile, your skin  needs to be as free of germs as possible. You can reduce the number of germs on your skin by washing with CHG (chlorahexidine gluconate) Soap before surgery.  CHG is an antiseptic cleaner which kills germs and bonds with the skin to continue killing germs even after washing.    Oral Hygiene is also important to reduce your risk of infection.  Remember - BRUSH YOUR TEETH THE MORNING OF SURGERY WITH YOUR REGULAR TOOTHPASTE  Please do not use if you have an allergy to CHG or antibacterial soaps. If your skin becomes reddened/irritated stop using the CHG.   Do not shave (including legs and underarms) for at least 48 hours prior to first CHG shower. It is OK to shave your face.  Please follow these instructions carefully.   Shower the NIGHT BEFORE SURGERY and the MORNING OF SURGERY  If you chose to wash your hair, wash your hair first as usual with your normal shampoo.  After you shampoo, rinse your hair and body thoroughly to remove the shampoo.  Use CHG Soap as you would any other liquid soap. You can apply CHG directly to the skin and wash gently with a scrungie or a clean washcloth.   Apply the CHG Soap to your body ONLY FROM THE NECK DOWN.  Do not use on open wounds or open sores. Avoid contact with your eyes, ears, mouth and genitals (private parts). Wash Face and genitals (private parts)  with your normal soap.   Wash thoroughly, paying special attention to the area where your surgery will be performed.  Thoroughly rinse your body with warm water from the neck down.  DO NOT shower/wash with your normal soap after using and rinsing off the CHG Soap.  Pat yourself dry with a CLEAN TOWEL.  Wear CLEAN PAJAMAS to bed the night before surgery  Place CLEAN SHEETS on your bed the night before your surgery  DO NOT SLEEP WITH PETS.   Day of Surgery: Take a shower with CHG soap. Do not wear jewelry or makeup Do not wear lotions, powders, perfumes/colognes, or deodorant. Do not shave 48 hours prior to surgery.  Men may shave face and neck. Do not bring valuables to the hospital.  [facility] is not responsible for any belongings or valuables. Do not wear nail polish, gel polish, artificial nails, or any other type of covering on natural nails (fingers and toes) If you have artificial nails or gel coating that need to be removed by a nail salon, please have this removed prior to surgery. Artificial nails or gel coating may interfere with anesthesia's ability to adequately monitor your vital signs.  Wear Clean/Comfortable clothing the  morning of surgery Remember to brush your teeth WITH YOUR REGULAR TOOTHPASTE.   Please read over the following fact sheets that you were given.    If you received a COVID test during your pre-op visit  it is requested that you wear a mask when out in public, stay away from anyone that may not be feeling well and notify your surgeon if you develop symptoms. If you have been in contact with anyone that has tested positive in the last 10 days please notify you surgeon.

## 2022-10-03 ENCOUNTER — Other Ambulatory Visit: Payer: Self-pay

## 2022-10-03 ENCOUNTER — Encounter (HOSPITAL_COMMUNITY)
Admission: RE | Admit: 2022-10-03 | Discharge: 2022-10-03 | Disposition: A | Discharge status: Home / Self Care | Payer: Medicare Other | Source: Ambulatory Visit | Attending: Surgery | Admitting: Surgery

## 2022-10-03 ENCOUNTER — Encounter (HOSPITAL_COMMUNITY): Payer: Self-pay

## 2022-10-03 VITALS — BP 110/64 | HR 84 | Temp 98.6°F | Resp 18 | Ht 66.0 in | Wt 166.8 lb

## 2022-10-03 DIAGNOSIS — Z789 Other specified health status: Secondary | ICD-10-CM | POA: Insufficient documentation

## 2022-10-03 DIAGNOSIS — Z01812 Encounter for preprocedural laboratory examination: Secondary | ICD-10-CM | POA: Insufficient documentation

## 2022-10-03 HISTORY — DX: Prediabetes: R73.03

## 2022-10-03 HISTORY — DX: Dyspnea, unspecified: R06.00

## 2022-10-03 LAB — COMPREHENSIVE METABOLIC PANEL
ALT: 16 U/L (ref 0–44)
AST: 19 U/L (ref 15–41)
Albumin: 3.6 g/dL (ref 3.5–5.0)
Alkaline Phosphatase: 42 U/L (ref 38–126)
Anion gap: 7 (ref 5–15)
BUN: 9 mg/dL (ref 8–23)
CO2: 28 mmol/L (ref 22–32)
Calcium: 8.9 mg/dL (ref 8.9–10.3)
Chloride: 107 mmol/L (ref 98–111)
Creatinine, Ser: 0.68 mg/dL (ref 0.44–1.00)
GFR, Estimated: >60 mL/min (ref >60)
Glucose, Bld: 100 mg/dL — ABNORMAL HIGH (ref 70–99)
Potassium: 4 mmol/L (ref 3.5–5.1)
Sodium: 142 mmol/L (ref 135–145)
Total Bilirubin: 0.6 mg/dL (ref 0.3–1.2)
Total Protein: 5.8 g/dL — ABNORMAL LOW (ref 6.5–8.1)

## 2022-10-03 LAB — CBC
HCT: 39.9 % (ref 36.0–46.0)
Hemoglobin: 13.2 g/dL (ref 12.0–15.0)
MCH: 31.2 pg (ref 26.0–34.0)
MCHC: 33.1 g/dL (ref 30.0–36.0)
MCV: 94.3 fL (ref 80.0–100.0)
Platelets: 189 10*3/uL (ref 150–400)
RBC: 4.23 MIL/uL (ref 3.87–5.11)
RDW: 14.5 % (ref 11.5–15.5)
WBC: 4.6 10*3/uL (ref 4.0–10.5)
nRBC: 0 % (ref 0.0–0.2)

## 2022-10-03 NOTE — Progress Notes (Signed)
PCP - Delrae Alfred, DO Cardiologist - Denies  PPM/ICD - Denies Device Orders - n/a Rep Notified - n/a  Chest x-ray - n/a EKG - Per pt, many years ago - result normal Stress Test - Denies ECHO - Denies Cardiac Cath - Denies  Sleep Study - Denies CPAP - n/a  No DM  Last dose of GLP1 agonist- n/a GLP1 instructions: n/a  Blood Thinner Instructions: n/a Aspirin Instructions: n/a  ERAS Protcol - Clear liquids until 0530 morning of surgery PRE-SURGERY Ensure or G2- n/a  COVID TEST- n/a   Anesthesia review: No.   Patient denies shortness of breath, fever, cough and chest pain at PAT appointment. Pt denies any respiratory illness/infection in the last two months.   All instructions explained to the patient, with a verbal understanding of the material. Patient agrees to go over the instructions while at home for a better understanding. Patient also instructed to self quarantine after being tested for COVID-19. The opportunity to ask questions was provided.

## 2022-10-10 NOTE — Anesthesia Preprocedure Evaluation (Signed)
Anesthesia Evaluation  Patient identified by MRN, date of birth, ID band Patient awake    Reviewed: Allergy & Precautions, H&P , NPO status , Patient's Chart, lab work & pertinent test results  Airway Mallampati: II  TM Distance: >3 FB Neck ROM: Full    Dental no notable dental hx. (+) Teeth Intact, Dental Advisory Given   Pulmonary asthma , COPD,  COPD inhaler, former smoker   Pulmonary exam normal breath sounds clear to auscultation       Cardiovascular Exercise Tolerance: Good negative cardio ROS  Rhythm:Regular Rate:Normal     Neuro/Psych    Depression    negative neurological ROS     GI/Hepatic hiatal hernia,GERD  Medicated,,  Endo/Other  negative endocrine ROS    Renal/GU negative Renal ROS  negative genitourinary   Musculoskeletal  (+) Arthritis , Osteoarthritis,    Abdominal   Peds  Hematology  (+) Blood dyscrasia, anemia   Anesthesia Other Findings   Reproductive/Obstetrics negative OB ROS                             Anesthesia Physical Anesthesia Plan  ASA: 2  Anesthesia Plan: General   Post-op Pain Management: Tylenol PO (pre-op)* and Regional block*   Induction: Intravenous  PONV Risk Score and Plan: 4 or greater and Ondansetron, Dexamethasone and Treatment may vary due to age or medical condition  Airway Management Planned: Oral ETT and LMA  Additional Equipment:   Intra-op Plan:   Post-operative Plan: Extubation in OR  Informed Consent: I have reviewed the patients History and Physical, chart, labs and discussed the procedure including the risks, benefits and alternatives for the proposed anesthesia with the patient or authorized representative who has indicated his/her understanding and acceptance.     Dental advisory given  Plan Discussed with: CRNA  Anesthesia Plan Comments:        Anesthesia Quick Evaluation

## 2022-10-11 ENCOUNTER — Other Ambulatory Visit: Payer: Self-pay

## 2022-10-11 ENCOUNTER — Ambulatory Visit (HOSPITAL_COMMUNITY): Payer: Medicare Other | Admitting: Anesthesiology

## 2022-10-11 ENCOUNTER — Ambulatory Visit (HOSPITAL_COMMUNITY)
Admission: RE | Admit: 2022-10-11 | Discharge: 2022-10-11 | Disposition: A | Discharge status: Home / Self Care | Payer: Medicare Other | Source: Ambulatory Visit | Attending: Surgery | Admitting: Surgery

## 2022-10-11 ENCOUNTER — Encounter (HOSPITAL_COMMUNITY): Payer: Self-pay | Admitting: Surgery

## 2022-10-11 ENCOUNTER — Encounter (HOSPITAL_COMMUNITY)
Admission: RE | Disposition: A | Discharge status: Home / Self Care | Payer: Self-pay | Source: Ambulatory Visit | Attending: Surgery

## 2022-10-11 ENCOUNTER — Ambulatory Visit (HOSPITAL_BASED_OUTPATIENT_CLINIC_OR_DEPARTMENT_OTHER): Payer: Medicare Other | Admitting: Anesthesiology

## 2022-10-11 DIAGNOSIS — K4090 Unilateral inguinal hernia, without obstruction or gangrene, not specified as recurrent: Secondary | ICD-10-CM | POA: Diagnosis present

## 2022-10-11 DIAGNOSIS — Z79899 Other long term (current) drug therapy: Secondary | ICD-10-CM | POA: Diagnosis not present

## 2022-10-11 DIAGNOSIS — M1990 Unspecified osteoarthritis, unspecified site: Secondary | ICD-10-CM | POA: Diagnosis not present

## 2022-10-11 DIAGNOSIS — K219 Gastro-esophageal reflux disease without esophagitis: Secondary | ICD-10-CM | POA: Diagnosis not present

## 2022-10-11 DIAGNOSIS — J449 Chronic obstructive pulmonary disease, unspecified: Secondary | ICD-10-CM | POA: Insufficient documentation

## 2022-10-11 DIAGNOSIS — Z09 Encounter for follow-up examination after completed treatment for conditions other than malignant neoplasm: Secondary | ICD-10-CM | POA: Diagnosis not present

## 2022-10-11 DIAGNOSIS — Z87891 Personal history of nicotine dependence: Secondary | ICD-10-CM | POA: Diagnosis not present

## 2022-10-11 HISTORY — PX: INSERTION OF MESH: SHX5868

## 2022-10-11 HISTORY — PX: INGUINAL HERNIA REPAIR: SHX194

## 2022-10-11 SURGERY — REPAIR, HERNIA, INGUINAL, ADULT
Anesthesia: General | Site: Groin | Laterality: Left

## 2022-10-11 MED ORDER — LACTATED RINGERS IV SOLN: INTRAVENOUS | Status: DC

## 2022-10-11 MED ORDER — ONDANSETRON HCL 4 MG/2ML IJ SOLN
INTRAMUSCULAR | Status: DC | PRN
  Administered 2022-10-11: 4 mg via INTRAVENOUS

## 2022-10-11 MED ORDER — CEFAZOLIN SODIUM-DEXTROSE 2-4 GM/100ML-% IV SOLN
2.0000 g | INTRAVENOUS | Status: AC
  Administered 2022-10-11: 2 g via INTRAVENOUS

## 2022-10-11 MED ORDER — BUPIVACAINE LIPOSOME 1.3 % IJ SUSP
INTRAMUSCULAR | Status: DC | PRN
  Administered 2022-10-11: 10 mL via PERINEURAL

## 2022-10-11 MED ORDER — ORAL CARE MOUTH RINSE: 15.0000 mL | Freq: Once | OROMUCOSAL | Status: AC

## 2022-10-11 MED ORDER — ONDANSETRON HCL 4 MG/2ML IJ SOLN
INTRAMUSCULAR | Status: AC
  Filled 2022-10-11: qty 2

## 2022-10-11 MED ORDER — LIDOCAINE 2% (20 MG/ML) 5 ML SYRINGE
INTRAMUSCULAR | Status: DC | PRN
  Administered 2022-10-11: 40 mg via INTRAVENOUS

## 2022-10-11 MED ORDER — PHENYLEPHRINE HCL-NACL 20-0.9 MG/250ML-% IV SOLN
INTRAVENOUS | Status: DC | PRN
  Administered 2022-10-11: 50 ug/min via INTRAVENOUS

## 2022-10-11 MED ORDER — DEXAMETHASONE SODIUM PHOSPHATE 10 MG/ML IJ SOLN
INTRAMUSCULAR | Status: AC
  Filled 2022-10-11: qty 1

## 2022-10-11 MED ORDER — DEXAMETHASONE SODIUM PHOSPHATE 10 MG/ML IJ SOLN
INTRAMUSCULAR | Status: DC | PRN
  Administered 2022-10-11: 10 mg via INTRAVENOUS

## 2022-10-11 MED ORDER — BUPIVACAINE-EPINEPHRINE (PF) 0.5% -1:200000 IJ SOLN
INTRAMUSCULAR | Status: DC | PRN
  Administered 2022-10-11: 20 mL via PERINEURAL

## 2022-10-11 MED ORDER — CEFAZOLIN SODIUM-DEXTROSE 2-4 GM/100ML-% IV SOLN
INTRAVENOUS | Status: AC
  Filled 2022-10-11: qty 100

## 2022-10-11 MED ORDER — FENTANYL CITRATE (PF) 250 MCG/5ML IJ SOLN
INTRAMUSCULAR | Status: AC
  Filled 2022-10-11: qty 5

## 2022-10-11 MED ORDER — FENTANYL CITRATE (PF) 100 MCG/2ML IJ SOLN: 25.0000 ug | INTRAMUSCULAR | Status: DC | PRN

## 2022-10-11 MED ORDER — PROPOFOL 10 MG/ML IV BOLUS
INTRAVENOUS | Status: AC
  Filled 2022-10-11: qty 20

## 2022-10-11 MED ORDER — ACETAMINOPHEN 500 MG PO TABS
ORAL_TABLET | ORAL | Status: AC
  Filled 2022-10-11: qty 2

## 2022-10-11 MED ORDER — CHLORHEXIDINE GLUCONATE CLOTH 2 % EX PADS: 6.0000 | MEDICATED_PAD | Freq: Once | CUTANEOUS | Status: DC

## 2022-10-11 MED ORDER — FENTANYL CITRATE (PF) 250 MCG/5ML IJ SOLN
INTRAMUSCULAR | Status: DC | PRN
  Administered 2022-10-11: 25 ug via INTRAVENOUS
  Administered 2022-10-11: 50 ug via INTRAVENOUS
  Administered 2022-10-11: 25 ug via INTRAVENOUS

## 2022-10-11 MED ORDER — BUPIVACAINE HCL (PF) 0.25 % IJ SOLN
INTRAMUSCULAR | Status: AC
  Filled 2022-10-11: qty 30

## 2022-10-11 MED ORDER — LIDOCAINE 2% (20 MG/ML) 5 ML SYRINGE
INTRAMUSCULAR | Status: AC
  Filled 2022-10-11: qty 5

## 2022-10-11 MED ORDER — ACETAMINOPHEN 500 MG PO TABS
1000.0000 mg | ORAL_TABLET | ORAL | Status: AC
  Administered 2022-10-11: 1000 mg via ORAL

## 2022-10-11 MED ORDER — ACETAMINOPHEN 500 MG PO TABS: 1000.0000 mg | ORAL_TABLET | Freq: Once | ORAL | Status: DC

## 2022-10-11 MED ORDER — PROPOFOL 10 MG/ML IV BOLUS
INTRAVENOUS | Status: DC | PRN
  Administered 2022-10-11: 20 mg via INTRAVENOUS
  Administered 2022-10-11: 100 mg via INTRAVENOUS

## 2022-10-11 MED ORDER — CHLORHEXIDINE GLUCONATE 0.12 % MT SOLN
OROMUCOSAL | Status: AC
  Filled 2022-10-11: qty 15

## 2022-10-11 MED ORDER — 0.9 % SODIUM CHLORIDE (POUR BTL) OPTIME
TOPICAL | Status: DC | PRN
  Administered 2022-10-11: 1000 mL

## 2022-10-11 MED ORDER — TRAMADOL HCL 50 MG PO TABS: 50.0000 mg | ORAL_TABLET | Freq: Four times a day (QID) | ORAL | 0 refills | Status: AC | PRN

## 2022-10-11 MED ORDER — BUPIVACAINE HCL 0.25 % IJ SOLN
INTRAMUSCULAR | Status: DC | PRN
  Administered 2022-10-11: 10 mL

## 2022-10-11 MED ORDER — CHLORHEXIDINE GLUCONATE 0.12 % MT SOLN
15.0000 mL | Freq: Once | OROMUCOSAL | Status: AC
  Administered 2022-10-11: 15 mL via OROMUCOSAL

## 2022-10-11 SURGICAL SUPPLY — 40 items
APL PRP STRL LF DISP 70% ISPRP (MISCELLANEOUS) ×1
APL SKNCLS STERI-STRIP NONHPOA (GAUZE/BANDAGES/DRESSINGS) ×1
BAG COUNTER SPONGE SURGICOUNT (BAG) ×1 IMPLANT
BAG SPNG CNTER NS LX DISP (BAG) ×1
BENZOIN TINCTURE PRP APPL 2/3 (GAUZE/BANDAGES/DRESSINGS) ×1 IMPLANT
CHLORAPREP W/TINT 26 (MISCELLANEOUS) ×1 IMPLANT
COVER SURGICAL LIGHT HANDLE (MISCELLANEOUS) ×1 IMPLANT
DRAPE LAPAROTOMY TRNSV 102X78 (DRAPES) IMPLANT
DRSG TEGADERM 4X4.75 (GAUZE/BANDAGES/DRESSINGS) ×1 IMPLANT
ELECT CAUTERY BLADE 6.4 (BLADE) IMPLANT
ELECT REM PT RETURN 9FT ADLT (ELECTROSURGICAL) ×1
ELECTRODE REM PT RTRN 9FT ADLT (ELECTROSURGICAL) ×1 IMPLANT
GAUZE 4X4 16PLY ~~LOC~~+RFID DBL (SPONGE) ×1 IMPLANT
GAUZE SPONGE 4X4 12PLY STRL (GAUZE/BANDAGES/DRESSINGS) ×1 IMPLANT
GLOVE BIO SURGEON STRL SZ7 (GLOVE) ×1 IMPLANT
GLOVE BIOGEL PI IND STRL 6.5 (GLOVE) IMPLANT
GLOVE BIOGEL PI IND STRL 7.5 (GLOVE) ×1 IMPLANT
GLOVE SURG SS PI 6.5 STRL IVOR (GLOVE) IMPLANT
GOWN STRL REUS W/ TWL LRG LVL3 (GOWN DISPOSABLE) ×2 IMPLANT
GOWN STRL REUS W/TWL LRG LVL3 (GOWN DISPOSABLE) ×2
KIT BASIN OR (CUSTOM PROCEDURE TRAY) ×1 IMPLANT
KIT TURNOVER KIT B (KITS) ×1 IMPLANT
MESH ULTRAPRO 3X6 7.6X15CM (Mesh General) IMPLANT
NDL HYPO 25GX1X1/2 BEV (NEEDLE) ×1 IMPLANT
NEEDLE HYPO 25GX1X1/2 BEV (NEEDLE) ×1 IMPLANT
NS IRRIG 1000ML POUR BTL (IV SOLUTION) ×1 IMPLANT
PACK GENERAL/GYN (CUSTOM PROCEDURE TRAY) ×1 IMPLANT
PAD ARMBOARD 7.5X6 YLW CONV (MISCELLANEOUS) ×1 IMPLANT
SLEEVE SCD COMPRESS KNEE MED (STOCKING) IMPLANT
SPONGE INTESTINAL PEANUT (DISPOSABLE) ×1 IMPLANT
STRIP CLOSURE SKIN 1/2X4 (GAUZE/BANDAGES/DRESSINGS) ×1 IMPLANT
SUT MNCRL AB 4-0 PS2 18 (SUTURE) ×1 IMPLANT
SUT VIC AB 0 CT2 27 (SUTURE) IMPLANT
SUT VIC AB 2-0 SH 27 (SUTURE) ×1
SUT VIC AB 2-0 SH 27X BRD (SUTURE) IMPLANT
SUT VIC AB 3-0 SH 27 (SUTURE) ×1
SUT VIC AB 3-0 SH 27XBRD (SUTURE) ×1 IMPLANT
SYR CONTROL 10ML LL (SYRINGE) ×1 IMPLANT
TOWEL GREEN STERILE FF (TOWEL DISPOSABLE) ×1 IMPLANT
YANKAUER SUCT BULB TIP NO VENT (SUCTIONS) IMPLANT

## 2022-10-11 NOTE — Op Note (Signed)
Left inguinal hernia repair with mesh Procedure Note  Indications: This is a 82 year old female who is status post right inguinal hernia repair with mesh on 06/04/2014 for a large direct defect.  Over the last year, the patient has palpated a "egg sized" mass in the left lower quadrant.  This causes no discomfort.  It has not enlarged.  In the last several weeks, the patient has developed a burning sensation as well as a large bulge in her left groin.  This remains reducible.  She denies any change in her bowel habits.   Pre-operative Diagnosis: left reducible inguinal hernia Post-operative Diagnosis: same  Surgeon: Wynona Luna   Assistants: none  Anesthesia: General LMA anesthesia  ASA Class: 2  Procedure Details  The patient was seen again in the Holding Room. The risks, benefits, complications, treatment options, and expected outcomes were discussed with the patient. The possibilities of reaction to medication, pulmonary aspiration, perforation of viscus, bleeding, recurrent infection, the need for additional procedures, and development of a complication requiring transfusion or further operation were discussed with the patient and/or family. The likelihood of success in repairing the hernia and returning the patient to their previous functional status is good.  There was concurrence with the proposed plan, and informed consent was obtained. The site of surgery was properly noted/marked. The patient was taken to the Operating Room, identified as Lindsay Porter, and the procedure verified as left inguinal hernia repair. A Time Out was held and the above information confirmed.  The patient was placed in the supine position and underwent induction of anesthesia. The lower abdomen and groin was prepped with Chloraprep and draped in the standard fashion, and 0.25% Marcaine with epinephrine was used to anesthetize the skin over the mid-portion of the inguinal canal. An oblique incision was  made. Dissection was carried down through the subcutaneous tissue with cautery to the external oblique fascia.  We opened the external oblique fascia along the direction of its fibers to the left edge of the pubic tubercle.  The floor of the inguinal canal was inspected and revealed a large hernia defect.  The round ligament was divided and we dissected around the direct hernia sac.  We reduced the hernia sac completely and closed the floor of the canal with 0 Vicryl. We used an Ultrapro mesh cut into an oval which was inserted and deployed across the floor of the inguinal canal. The mesh was tucked underneath the external oblique fascia laterally.   The mesh was secured to the pubic tubercle with 0 Vicryl.  Additional stay sutures of 0 Vicryl were used to attach the mesh to the shelving edge inferiorly and the internal oblique muscle superiorly.  The external oblique fascia was reapproximated with 2-0 Vicryl.  3-0 Vicryl was used to close the subcutaneous tissues and 4-0 Monocryl was used to close the skin in subcuticular fashion.  Benzoin and steri-strips were used to seal the incision.  A clean dressing was applied.  The patient was then extubated and brought to the recovery room in stable condition.  All sponge, instrument, and needle counts were correct prior to closure and at the conclusion of the case.   Estimated Blood Loss: Minimal                 Complications: None; patient tolerated the procedure well.         Disposition: PACU - hemodynamically stable.         Condition: stable  [name],  MD, [facility]  General Surgery   10/11/2022 9:42 AM

## 2022-10-11 NOTE — Anesthesia Procedure Notes (Signed)
Anesthesia Regional Block: TAP block   Pre-Anesthetic Checklist: , timeout performed,  Correct Patient, Correct Site, Correct Laterality,  Correct Procedure, Correct Position, site marked,  Risks and benefits discussed,  Pre-op evaluation,  At surgeon's request and post-op pain management  Laterality: Left  Prep: Maximum Sterile Barrier Precautions used, chloraprep       Needles:  Injection technique: Single-shot  Needle Type: Echogenic Stimulator Needle     Needle Length: 9cm  Needle Gauge: 21     Additional Needles:   Procedures:,,,, ultrasound used (permanent image in chart),,    Narrative:  Start time: 10/11/2022 7:56 AM End time: 10/11/2022 8:06 AM Injection made incrementally with aspirations every 5 mL.  Performed by: Personally  Anesthesiologist: Gaynelle Adu, MD

## 2022-10-11 NOTE — Anesthesia Procedure Notes (Signed)
Procedure Name: LMA Insertion Date/Time: 10/11/2022 8:31 AM  Performed by: Quentin Ore, CRNAPre-anesthesia Checklist: Patient identified, Emergency Drugs available, Suction available and Patient being monitored Patient Re-evaluated:Patient Re-evaluated prior to induction Oxygen Delivery Method: Circle system utilized Preoxygenation: Pre-oxygenation with 100% oxygen Induction Type: IV induction LMA Size: 4.0 Number of attempts: 1 Placement Confirmation: positive ETCO2 and breath sounds checked- equal and bilateral Tube secured with: Tape Dental Injury: Teeth and Oropharynx as per pre-operative assessment

## 2022-10-11 NOTE — Transfer of Care (Signed)
Immediate Anesthesia Transfer of Care Note  Patient: Lindsay Porter  Procedure(s) Performed: LEFT HERNIA REPAIR INGUINAL ADULT WITH MESH (Left: Groin) INSERTION OF MESH (Left: Groin)  Patient Location: PACU  Anesthesia Type:GA combined with regional for post-op pain  Level of Consciousness: awake, alert , and oriented  Airway & Oxygen Therapy: Patient Spontanous Breathing and Patient connected to nasal cannula oxygen  Post-op Assessment: Report given to RN, Post -op Vital signs reviewed and stable, and Patient moving all extremities  Post vital signs: Reviewed and stable  Last Vitals:  Vitals Value Taken Time  BP 115/72 10/11/22 0945  Temp    Pulse 75 10/11/22 0947  Resp 25 10/11/22 0947  SpO2 95 % 10/11/22 0947  Vitals shown include unvalidated device data.  Last Pain:  Vitals:   10/11/22 0710  TempSrc:   PainSc: 0-No pain      Patients Stated Pain Goal: 0 (10/11/22 0710)  Complications: No notable events documented.

## 2022-10-11 NOTE — H&P (Signed)
Chief Complaint: Inguinal Hernia       History of Present Illness: Lindsay Porter is a 82 y.o. female who is seen today as an office consultation at the request of Dr. Seymour Bars for evaluation of Inguinal Hernia .     This is a 82 year old female who is status post right inguinal hernia repair with mesh on 06/04/2014 for a large direct defect.  Over the last year, the patient has palpated a "egg sized" mass in the left lower quadrant.  This causes no discomfort.  It has not enlarged.  In the last several weeks, the patient has developed a burning sensation as well as a large bulge in her left groin.  This remains reducible.  She denies any change in her bowel habits.   The patient has stable COPD and is no longer seeing pulmonology on a regular basis.  She remains in the lung cancer screening program.     Review of Systems: A complete review of systems was obtained from the patient.  I have reviewed this information and discussed as appropriate with the patient.  See HPI as well for other ROS.   Review of Systems  Constitutional: Negative.   HENT: Negative.    Eyes: Negative.   Respiratory:  Positive for shortness of breath and wheezing.   Cardiovascular: Negative.   Gastrointestinal: Negative.   Genitourinary: Negative.   Musculoskeletal: Negative.   Skin:  Positive for itching.  Neurological: Negative.   Endo/Heme/Allergies: Negative.   Psychiatric/Behavioral:  Positive for memory loss.         Medical History: Past Medical History         Past Medical History:  Diagnosis Date   Anemia     Arthritis     COPD (chronic obstructive pulmonary disease) (CMS-HCC)     GERD (gastroesophageal reflux disease)     Substance abuse (CMS-HCC)               Patient Active Problem List  Diagnosis   Allergic rhinitis   Anemia, iron deficiency   Chronic obstructive pulmonary disease, unspecified (CMS-HCC)   Cough   Depression   Diverticulosis   Dyspnea   Fatigue   Gastroesophageal  reflux disease   Hyperlipidemia   Primary osteoarthritis involving multiple joints   Prediabetes      Past Surgical History           Past Surgical History:  Procedure Laterality Date   ENDOSCOPIC CARPAL TUNNEL RELEASE       JOINT REPLACEMENT       REPAIR HIATAL HERNIA       REPAIR INGUINAL HERNIA            Allergies           Allergies  Allergen Reactions   Montelukast Hives   Sulfa (Sulfonamide Antibiotics) Other (See Comments)      As child                   Current Outpatient Medications on File Prior to Visit  Medication Sig Dispense Refill   albuterol 90 mcg/actuation inhaler Inhale into the lungs every 6 (six) hours as needed       budesonide-formoteroL (SYMBICORT) 160-4.5 mcg/actuation inhaler INHALE 2 PUFFS BY MOUTH TWICE A DAY 30       fluticasone propionate (FLONASE) 50 mcg/actuation nasal spray Administer 2 sprays in each nostril daily.       meloxicam (MOBIC) 15 MG tablet Take 1 tablet by mouth once daily  pantoprazole (PROTONIX) 40 MG DR tablet         sertraline (ZOLOFT) 100 MG tablet Take 1 tablet by mouth once daily       cyanocobalamin (VITAMIN B12) 1000 MCG tablet Take by mouth       ferrous sulfate 325 (65 FE) MG tablet Take 325 mg by mouth daily with breakfast       gabapentin (NEURONTIN) 300 MG capsule Take 2 capsules by mouth at bedtime       loratadine (CLARITIN) 10 mg tablet Take by mouth       pramipexole (MIRAPEX) 0.5 MG tablet Take 1 tablet by mouth once daily        No current facility-administered medications on file prior to visit.      Family History  History reviewed. No pertinent family history.      Social History           Tobacco Use  Smoking Status Former   Types: Cigarettes  Smokeless Tobacco Never      Social History  Social History             Socioeconomic History   Marital status: Married  Tobacco Use   Smoking status: Former      Types: Cigarettes   Smokeless tobacco: Never  Vaping Use   Vaping Use:  Never used  Substance and Sexual Activity   Alcohol use: Yes   Drug use: Never        Objective:       Body mass index is 27.12 kg/m.   Physical Exam    Constitutional:  WDWN in NAD, conversant, no obvious deformities; lying in bed comfortably Eyes:  Pupils equal, round; sclera anicteric; moist conjunctiva; no lid lag HENT:  Oral mucosa moist; good dentition  Neck:  No masses palpated, trachea midline; no thyromegaly Lungs:  CTA bilaterally; normal respiratory effort Breasts:  symmetric, no nipple changes; no palpable masses or lymphadenopathy on either side CV:  Regular rate and rhythm; no murmurs; extremities well-perfused with no edema Abd:  +bowel sounds, soft, non-tender, no palpable organomegaly; no palpable hernias; left lower quadrant shows a 4 x 2 cm subcutaneous mass in the left lower quadrant.  This does not enlarge with Valsalva maneuver.  It is not reducible.  This likely represents a small subcutaneous lipoma. The right inguinal incision is well-healed.  No sign of right inguinal hernia The patient has a moderate-sized reducible left inguinal hernia.  It is easily palpated when standing with Valsalva maneuver.  It spontaneously reduces when she is supine. Musc:  Slow steady gait; no apparent clubbing or cyanosis in extremities Lymphatic:  No palpable cervical or axillary lymphadenopathy Skin:  Warm, dry; no sign of jaundice Psychiatric - alert and oriented x 4; calm mood and affect     Labs, Imaging and Diagnostic Testing: Retrospective review of a CT scan of the abdomen and pelvis performed in 2020 shows that she likely had a small left inguinal hernia at that time.   Assessment and Plan:  Diagnoses and all orders for this visit:   Left inguinal hernia     Recommend left inguinal hernia repair with mesh.  The surgical procedure has been discussed with the patient.  Potential risks, benefits, alternative treatments, and expected outcomes have been explained.   All of the patient's questions at this time have been answered.  The likelihood of reaching the patient's treatment goal is good.  The patient understand the proposed surgical procedure and wishes to  proceed.    Wilmon ArmsMatthew K. Corliss Skainssuei, MD, [facility]  General Surgery   10/11/2022 7:16 AM

## 2022-10-11 NOTE — Anesthesia Postprocedure Evaluation (Signed)
Anesthesia Post Note  Patient: Lindsay Porter  Procedure(s) Performed: LEFT HERNIA REPAIR INGUINAL ADULT WITH MESH (Left: Groin) INSERTION OF MESH (Left: Groin)     Patient location during evaluation: PACU Anesthesia Type: General and Regional Level of consciousness: awake and alert Pain management: pain level controlled Vital Signs Assessment: post-procedure vital signs reviewed and stable Respiratory status: spontaneous breathing, nonlabored ventilation and respiratory function stable Cardiovascular status: blood pressure returned to baseline and stable Postop Assessment: no apparent nausea or vomiting Anesthetic complications: no  No notable events documented.  Last Vitals:  Vitals:   10/11/22 1000 10/11/22 1015  BP: 131/83 133/85  Pulse: 69 69  Resp: 10 15  Temp:  36.6 C  SpO2: 96% 96%    Last Pain:  Vitals:   10/11/22 0945  TempSrc:   PainSc: 0-No pain                 Shauni Henner,W. EDMOND

## 2022-10-11 NOTE — Discharge Instructions (Signed)
[facility] _______[facility]   INGUINAL HERNIA REPAIR: POST OP INSTRUCTIONS  Always review your discharge instruction sheet given to you by the facility where your surgery was performed. IF YOU HAVE DISABILITY OR FAMILY LEAVE FORMS, YOU MUST BRING THEM TO THE OFFICE FOR PROCESSING.   DO NOT GIVE THEM TO YOUR DOCTOR.  1. A  prescription for pain medication may be given to you upon discharge.  Take your pain medication as prescribed, if needed.  If narcotic pain medicine is not needed, then you may take acetaminophen (Tylenol) or ibuprofen (Advil) as needed. 2. Take your usually prescribed medications unless otherwise directed. If you need a refill on your pain medication, please contact your pharmacy.  They will contact our office to request authorization. Prescriptions will not be filled after 5 pm or on week-ends. 3. You should follow a light diet the first 24 hours after arrival home, such as soup and crackers, etc.  Be sure to include lots of fluids daily.  Resume your normal diet the day after surgery. 4.Most patients will experience some swelling and bruising around the umbilicus or in the groin and scrotum.  Ice packs and reclining will help.  Swelling and bruising can take several days to resolve.  6. It is common to experience some constipation if taking pain medication after surgery.  Increasing fluid intake and taking a stool softener (such as Colace) will usually help or prevent this problem from occurring.  A mild laxative (Milk of Magnesia or Miralax) should be taken according to package directions if there are no bowel movements after 48 hours. 7. Unless discharge instructions indicate otherwise, you may remove your bandages 24-48 hours after surgery, and you may shower at that time.  You may have steri-strips (small skin tapes) in place directly over the incision.  These strips should be left on the skin for 7-10 days.  If your surgeon used skin glue on the incision, you may  shower in 24 hours.  The glue will flake off over the next 2-3 weeks.  Any sutures or staples will be removed at the office during your follow-up visit. 8. ACTIVITIES:  You may resume regular (light) daily activities beginning the next day--such as daily self-care, walking, climbing stairs--gradually increasing activities as tolerated.  You may have sexual intercourse when it is comfortable.  Refrain from any heavy lifting or straining until approved by your doctor.  a.You may drive when you are no longer taking prescription pain medication, you can comfortably wear a seatbelt, and you can safely maneuver your car and apply brakes. b.RETURN TO WORK:   _____________________________________________  9.You should see your doctor in the office for a follow-up appointment approximately 2-3 weeks after your surgery.  Make sure that you call for this appointment within a day or two after you arrive home to insure a convenient appointment time. 10.OTHER INSTRUCTIONS: _________________________    _____________________________________  WHEN TO CALL YOUR DOCTOR: Fever over 101.0 Inability to urinate Nausea and/or vomiting Extreme swelling or bruising Continued bleeding from incision. Increased pain, redness, or drainage from the incision  The clinic staff is available to answer your questions during regular business hours.  Please don't hesitate to call and ask to speak to one of the nurses for clinical concerns.  If you have a medical emergency, go to the nearest emergency room or call 911.  A surgeon from [facility] is always on call at the hospital   [address], [address], [place],   [place]  [address] ?  [address] [address], [place], [place]   [address] [phone] ? [phone] ? FAX [phone] Web site: [email or web address]  

## 2022-10-12 ENCOUNTER — Encounter (HOSPITAL_COMMUNITY): Payer: Self-pay | Admitting: Surgery

## 2022-10-15 ENCOUNTER — Other Ambulatory Visit: Payer: Self-pay | Admitting: Pulmonary Disease

## 2022-10-19 ENCOUNTER — Telehealth: Payer: Self-pay | Admitting: Pulmonary Disease

## 2022-10-19 NOTE — Telephone Encounter (Signed)
Needs a refill for albuterol (VENTOLIN HFA) 108 (90 Base) MCG/ACT inhaler [[id number]]    [facility] [address] [employer]

## 2022-10-20 ENCOUNTER — Other Ambulatory Visit: Payer: Self-pay

## 2022-10-20 MED ORDER — ALBUTEROL SULFATE HFA 108 (90 BASE) MCG/ACT IN AERS: 2.0000 | INHALATION_SPRAY | Freq: Four times a day (QID) | RESPIRATORY_TRACT | 6 refills | Status: DC | PRN

## 2022-10-20 NOTE — Telephone Encounter (Signed)
Yes ok to refill  Thanks, JD

## 2022-10-20 NOTE — Telephone Encounter (Signed)
Pt is requesting refill on albuterol inhaler. Since this has never been filled here, are you okay with me sending this in? Please advise Dr. Francine Graven

## 2022-10-20 NOTE — Telephone Encounter (Signed)
ATC X1 LVM for patient to call the office back. Please advise albuterol inhaler has been sent to pharmacy

## 2022-10-23 NOTE — Telephone Encounter (Signed)
Pt's inhaler was refilled. Closing encounter.

## 2023-01-13 ENCOUNTER — Other Ambulatory Visit: Payer: Self-pay | Admitting: Pulmonary Disease

## 2023-08-14 ENCOUNTER — Encounter: Payer: Self-pay | Admitting: Internal Medicine

## 2023-10-22 ENCOUNTER — Ambulatory Visit: Payer: Medicare Other | Admitting: Internal Medicine

## 2023-10-22 ENCOUNTER — Encounter: Payer: Self-pay | Admitting: Internal Medicine

## 2023-10-22 VITALS — BP 118/60 | HR 68 | Ht 66.0 in | Wt 165.0 lb

## 2023-10-22 DIAGNOSIS — K219 Gastro-esophageal reflux disease without esophagitis: Secondary | ICD-10-CM | POA: Diagnosis not present

## 2023-10-22 DIAGNOSIS — K222 Esophageal obstruction: Secondary | ICD-10-CM | POA: Diagnosis not present

## 2023-10-22 DIAGNOSIS — R1310 Dysphagia, unspecified: Secondary | ICD-10-CM | POA: Diagnosis not present

## 2023-10-22 DIAGNOSIS — R1319 Other dysphagia: Secondary | ICD-10-CM

## 2023-10-22 MED ORDER — PANTOPRAZOLE SODIUM 40 MG PO TBEC
40.0000 mg | DELAYED_RELEASE_TABLET | Freq: Two times a day (BID) | ORAL | 11 refills | Status: AC
Start: 2023-10-22 — End: ?

## 2023-10-22 NOTE — Patient Instructions (Addendum)
 We have sent the following medications to your pharmacy for you to pick up at your convenience: Pantoprazole  40 mg twice daily.  _______________________________________________________  If your blood pressure at your visit was 140/90 or greater, please contact your primary care physician to follow up on this.  _______________________________________________________  If you are age 81 or older, your body mass index should be between 23-30. Your Body mass index is 26.63 kg/m. If this is out of the aforementioned range listed, please consider follow up with your Primary Care Provider.  If you are age 54 or younger, your body mass index should be between 19-25. Your Body mass index is 26.63 kg/m. If this is out of the aformentioned range listed, please consider follow up with your Primary Care Provider.   ________________________________________________________  The [facility] providers would like to encourage you to use MYCHART to communicate with providers for non-urgent requests or questions.  Due to long hold times on the telephone, sending your provider a message by [facility] may be a faster and more efficient way to get a response.  Please allow 48 business hours for a response.  Please remember that this is for non-urgent requests.  _______________________________________________________

## 2023-10-22 NOTE — Progress Notes (Signed)
 HISTORY OF PRESENT ILLNESS:  Lindsay Porter is a 83 y.o. female with past medical history as listed below.  Previous patient of [facility].  Was seen here 1 time October 20, 2020 regarding raspy voice and difficulty swallowing.  See that dictation for details.  She is accompanied by her husband Lindsay Porter  At the time of that visit she was prescribed pantoprazole  40 mg daily.  Barium swallow with tablet was performed.  Prominent cricopharyngeus, moderate hiatal hernia with spontaneous reflux, and esophageal dysmotility noted.  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  Testing was negative.  The distal stricture was dilated.  She was told to increase the pantoprazole  to 40 mg twice daily and follow-up in 2 months.  She did not.  She presents today with her husband.  It does not appear that she was compliant with pantoprazole  therapy.  She cannot recall whether dilation was beneficial.  She has a number of complaints today that are similar to those of 3 years ago including raspy voice and a sensation of food stopping in the side of her neck.  Prior evaluation for Zenker's was negative.  She describes herself as an alcoholic.  She reports that lemon juice seems to aggravate reflux symptoms.  She does get regurgitation at times.  No weight loss.  GI review of systems is otherwise negative  REVIEW OF SYSTEMS:  All non-GI ROS negative unless otherwise stated in the HPI except for arthritis, cough, fatigue, muscle cramps, shortness of breath, sore throat  Past Medical History:  Diagnosis Date   Alcohol abuse    Allergy    Anemia, iron deficiency    Arthritis    Carpal tunnel syndrome    COPD (chronic obstructive pulmonary disease) (HCC)    Depression    Diverticulosis    Dyspnea    GERD (gastroesophageal reflux disease)    Hiatal hernia    Hyperlipidemia    Insomnia    Osteopenia    Ovarian cyst    Pneumonia 07/2022    Pre-diabetes    Restless leg syndrome    Seasonal allergies    Vitamin D deficiency    Wears contact lenses     Past Surgical History:  Procedure Laterality Date   ANKLE SURGERY  07/07/2009   left   APPENDECTOMY     BREAST LUMPECTOMY Left    BUNIONECTOMY  2006   left   CARPAL TUNNEL RELEASE Right    COLONOSCOPY     HIATAL HERNIA REPAIR  04/19/2012   Procedure: LAPAROSCOPIC REPAIR OF HIATAL HERNIA;  Surgeon: Quitman Bucy, MD;  Location: [facility];  Service: General;  Laterality: N/A;   INGUINAL HERNIA REPAIR Right 06/04/2014   Procedure: HERNIA REPAIR INGUINAL ADULT;  Surgeon: Dareen Ebbing, MD;  Location: [facility];  Service: General;  Laterality: Right;   INGUINAL HERNIA REPAIR Left 10/11/2022   Procedure: LEFT HERNIA REPAIR INGUINAL ADULT WITH MESH;  Surgeon: Dareen Ebbing, MD;  Location: [facility];  Service: General;  Laterality: Left;  TAP BLOCK   INSERTION OF MESH Right 06/04/2014   Procedure: INSERTION OF MESH;  Surgeon: Dareen Ebbing, MD;  Location: [facility];  Service: General;  Laterality: Right;   INSERTION OF MESH Left 10/11/2022   Procedure: INSERTION OF MESH;  Surgeon: Dareen Ebbing, MD;  Location: [facility];  Service: General;  Laterality: Left;   KNEE ARTHROPLASTY Left    KNEE SURGERY  2005   lt   nose surgery  12/2010   SHOULDER SURGERY  1950   right   TONSILLECTOMY     UPPER GASTROINTESTINAL ENDOSCOPY     UPPER GI ENDOSCOPY      Social History Lindsay Porter  reports that she quit smoking about 16 years ago. Her smoking use included cigarettes. She started smoking about 61 years ago. She has a 90 pack-year smoking history. She has never used smokeless tobacco. She reports current alcohol use of about 21.0 standard drinks of alcohol per week. She reports that she does not use drugs.  family history includes Alcoholism in her father; Arthritis in her sister; Asthma in her mother; Rheum arthritis in her mother.  Allergies   Allergen Reactions   Singulair [Montelukast Sodium] Hives   Sulfa Antibiotics Other (See Comments)    Childhood reaction        PHYSICAL EXAMINATION: Vital signs: BP 118/60   Pulse 68   Ht 5\' 6"  (1.676 m)   Wt 165 lb (74.8 kg)   BMI 26.63 kg/m   Constitutional: generally well-appearing, no acute distress Psychiatric: alert and oriented x3, cooperative Eyes: extraocular movements intact, anicteric, conjunctiva pink Mouth: oral pharynx moist, no lesions Neck: supple no lymphadenopathy Cardiovascular: heart regular rate and rhythm, no murmur Lungs: clear to auscultation bilaterally Abdomen: soft, nontender, nondistended, no obvious ascites, no peritoneal signs, normal bowel sounds, no organomegaly Rectal: Omitted Extremities: no clubbing, cyanosis, or lower extremity edema bilaterally Skin: no lesions on visible extremities Neuro: No focal deficits.  Cranial nerves intact  ASSESSMENT:  1.  GERD.  Significant active symptoms 2.  Pharyngeal symptoms.  Possibly related to GERD 3.  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  Complex hiatal hernia s/p repair with recurrence. 5.  General Medical problems   PLAN:  1.  Reflux precautions.  Strict adherence 2.  Prescribe pantoprazole  40 mg p.o. twice daily.  Medication risks reviewed 3.  Office follow-up in 3 months to see if medical therapy has impacted her symptom complex.  Contact the office in the interim for any questions or problems. Total time of 45 minutes was spent preparing to see the patient, reviewing a myriad data, obtaining comprehensive history, performing medically appropriate physical exam, counseling and educating the patient and her husband regarding the above listed issues, prescribing medication, defining follow-up interval, and documenting clinical information in the health record I have been in my

## 2023-11-05 ENCOUNTER — Encounter: Payer: Self-pay | Admitting: Podiatry

## 2023-11-05 ENCOUNTER — Ambulatory Visit (INDEPENDENT_AMBULATORY_CARE_PROVIDER_SITE_OTHER)

## 2023-11-05 ENCOUNTER — Ambulatory Visit: Admitting: Podiatry

## 2023-11-05 VITALS — Ht 66.0 in | Wt 165.0 lb

## 2023-11-05 DIAGNOSIS — M1990 Unspecified osteoarthritis, unspecified site: Secondary | ICD-10-CM | POA: Diagnosis not present

## 2023-11-05 DIAGNOSIS — M19072 Primary osteoarthritis, left ankle and foot: Secondary | ICD-10-CM

## 2023-11-05 NOTE — Patient Instructions (Addendum)
  VISIT SUMMARY: Today, we discussed the pain you are experiencing in your left foot, particularly in the arch area. We reviewed your history of foot surgeries and your preference for non-pharmacological treatments. We also talked about your family history of arthritis and your cautious approach to adding new medications.  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 For your left foot, we recommend using supportive shoes with good arch support, such as Hoka, Altra, or [employer]. You may also benefit from orthotics or insoles, and a brace for additional support. For pain relief, you can use Voltaren gel up to three or four times a day. We advise limiting steroid injections and encourage exercises like toe raises and walking to maintain joint mobility. Swimming is also a good low-impact exercise option.   INSTRUCTIONS: Please follow up with us  if your pain becomes debilitating for a steroid injection or if you have any concerns about your treatment plan. Continue with the recommended exercises and use the supportive footwear and orthotics as discussed.                      Contains text generated by [employer].                                 Contains text generated by [employer].

## 2023-11-08 NOTE — Progress Notes (Signed)
 Subjective:  Patient ID: Lindsay Porter, female    DOB: 08-23-1940,  MRN: [id number]  Chief Complaint  Patient presents with   Arthritis   Foot Pain    Pt is here for left foot pain states the pain has been there for months, has had previous surgeries in left foot and ankle, not in just one spot, states she is getting worst, hurts mostly in arch and on the top of her foot.    Discussed the use of AI scribe software for clinical note transcription with the patient, who gave verbal consent to proceed.  History of Present Illness Lindsay Porter is an 83 year old female with a history of foot surgeries who presents with left foot pain.  She has undergone multiple surgeries on her left foot, with the most recent surgery occurring a couple of years ago. She experiences pain primarily in the arch of her left foot, more on the inside than the outside, describing it as 'achy'. The pain is not consistent but occurs daily and can wake her up at night. No pain in other areas of the foot when pressed.  She has not received any injections for the pain and has not been using any specific treatments for it. The pain can be quite severe after periods of inactivity, such as after driving or sleeping, but improves with movement.  Her family history includes arthritis, as her mother was born with the condition. She is cautious about adding medications or chemicals to her regimen and prefers to manage her condition with non-pharmacological methods.      Objective:    Physical Exam VASCULAR: DP and PT pulse palpable. Foot is warm and well-perfused. Capillary fill time is brisk. DERMATOLOGIC: Normal skin turgor, texture, and temperature. No open lesions, rashes, or ulcerations. NEUROLOGIC: Normal sensation to light touch and pressure. No paresthesias. 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 Pain on palpation of dorsal and lateral midarch and midfoot.  Smooth pain-free range of motion of all examined joints. No ecchymosis or bruising. No gross deformity.   No images are attached to the encounter.    Results RADIOLOGY Left foot radiographs: Previous bunion correction with internal fixation, osteotomy with internal fixation, ORIF of ankle with severe collapse of longitudinal arch, severe osteoarthritis of the navicular cuneiforms and tarsometatarsal joints, moderate osteopenia (11/05/2023)   Assessment:   1. Arthritis of left midfoot      Plan:  Patient was evaluated and treated and all questions answered.  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 The condition is severe, with absent cartilage in several joints, leading to pain and stiffness, particularly in the arch and medial side of the foot. The arthritis causes arch collapse, resulting in increased pressure and pain. Surgical intervention, such as joint fusion, is high risk due to age and potential complications with bone healing. Conservative management is preferred unless pain becomes debilitating. Risks of surgery include prolonged immobilization and increased difficulty with bone healing due to age-related factors. Conservative management includes supportive footwear, orthotics, and topical NSAIDs. - Recommend supportive shoes with good arch support and stiffness, such as Hoka, Altra, or [employer]. - Consider orthotics or insoles for additional support. - Suggest a brace for the ankle and foot if needed. - Advise on the use of Voltaren gel, a topical NSAID, up to three  or four times a day for pain relief. - Limit the use of steroid injections to manage acute pain and inflammation. - Encourage exercises like toe raises and walking to maintain joint mobility. - Recommend swimming as a low-impact exercise  option.  Moderate osteopenia Moderate osteopenia may affect bone density and healing, particularly in the context of potential surgical interventions for osteoarthritis.      Return if symptoms worsen or fail to improve.

## 2023-12-22 ENCOUNTER — Other Ambulatory Visit: Payer: Self-pay | Admitting: Pulmonary Disease

## 2023-12-25 ENCOUNTER — Emergency Department (HOSPITAL_BASED_OUTPATIENT_CLINIC_OR_DEPARTMENT_OTHER)

## 2023-12-25 ENCOUNTER — Other Ambulatory Visit: Payer: Self-pay

## 2023-12-25 ENCOUNTER — Encounter (HOSPITAL_BASED_OUTPATIENT_CLINIC_OR_DEPARTMENT_OTHER): Payer: Self-pay | Admitting: Emergency Medicine

## 2023-12-25 ENCOUNTER — Inpatient Hospital Stay (HOSPITAL_BASED_OUTPATIENT_CLINIC_OR_DEPARTMENT_OTHER)
Admission: EM | Admit: 2023-12-25 | Discharge: 2023-12-28 | DRG: 190 | Disposition: A | Discharge status: Home / Self Care | Attending: Internal Medicine | Admitting: Internal Medicine

## 2023-12-25 DIAGNOSIS — J441 Chronic obstructive pulmonary disease with (acute) exacerbation: Secondary | ICD-10-CM | POA: Diagnosis not present

## 2023-12-25 DIAGNOSIS — D649 Anemia, unspecified: Secondary | ICD-10-CM | POA: Diagnosis present

## 2023-12-25 DIAGNOSIS — Z882 Allergy status to sulfonamides status: Secondary | ICD-10-CM

## 2023-12-25 DIAGNOSIS — J208 Acute bronchitis due to other specified organisms: Secondary | ICD-10-CM | POA: Diagnosis present

## 2023-12-25 DIAGNOSIS — F32A Depression, unspecified: Secondary | ICD-10-CM | POA: Diagnosis present

## 2023-12-25 DIAGNOSIS — R0902 Hypoxemia: Secondary | ICD-10-CM | POA: Diagnosis not present

## 2023-12-25 DIAGNOSIS — Z1152 Encounter for screening for COVID-19: Secondary | ICD-10-CM

## 2023-12-25 DIAGNOSIS — R7303 Prediabetes: Secondary | ICD-10-CM | POA: Diagnosis present

## 2023-12-25 DIAGNOSIS — E785 Hyperlipidemia, unspecified: Secondary | ICD-10-CM | POA: Diagnosis present

## 2023-12-25 DIAGNOSIS — K219 Gastro-esophageal reflux disease without esophagitis: Secondary | ICD-10-CM | POA: Diagnosis present

## 2023-12-25 DIAGNOSIS — G2581 Restless legs syndrome: Secondary | ICD-10-CM | POA: Diagnosis present

## 2023-12-25 DIAGNOSIS — I272 Pulmonary hypertension, unspecified: Secondary | ICD-10-CM | POA: Diagnosis present

## 2023-12-25 DIAGNOSIS — J9601 Acute respiratory failure with hypoxia: Secondary | ICD-10-CM | POA: Diagnosis not present

## 2023-12-25 DIAGNOSIS — Z825 Family history of asthma and other chronic lower respiratory diseases: Secondary | ICD-10-CM

## 2023-12-25 DIAGNOSIS — F1020 Alcohol dependence, uncomplicated: Secondary | ICD-10-CM | POA: Diagnosis present

## 2023-12-25 DIAGNOSIS — Z7951 Long term (current) use of inhaled steroids: Secondary | ICD-10-CM

## 2023-12-25 DIAGNOSIS — Z87891 Personal history of nicotine dependence: Secondary | ICD-10-CM

## 2023-12-25 DIAGNOSIS — Z96652 Presence of left artificial knee joint: Secondary | ICD-10-CM | POA: Diagnosis present

## 2023-12-25 DIAGNOSIS — Z888 Allergy status to other drugs, medicaments and biological substances status: Secondary | ICD-10-CM

## 2023-12-25 DIAGNOSIS — Z8261 Family history of arthritis: Secondary | ICD-10-CM

## 2023-12-25 DIAGNOSIS — Z811 Family history of alcohol abuse and dependence: Secondary | ICD-10-CM

## 2023-12-25 DIAGNOSIS — J440 Chronic obstructive pulmonary disease with acute lower respiratory infection: Secondary | ICD-10-CM | POA: Diagnosis present

## 2023-12-25 DIAGNOSIS — Z791 Long term (current) use of non-steroidal anti-inflammatories (NSAID): Secondary | ICD-10-CM

## 2023-12-25 DIAGNOSIS — Z79899 Other long term (current) drug therapy: Secondary | ICD-10-CM

## 2023-12-25 LAB — CBC
HCT: 37.9 % (ref 36.0–46.0)
Hemoglobin: 12.2 g/dL (ref 12.0–15.0)
MCH: 31.4 pg (ref 26.0–34.0)
MCHC: 32.2 g/dL (ref 30.0–36.0)
MCV: 97.4 fL (ref 80.0–100.0)
Platelets: 178 10*3/uL (ref 150–400)
RBC: 3.89 MIL/uL (ref 3.87–5.11)
RDW: 13.6 % (ref 11.5–15.5)
WBC: 4.3 10*3/uL (ref 4.0–10.5)
nRBC: 0 % (ref 0.0–0.2)

## 2023-12-25 LAB — BASIC METABOLIC PANEL WITH GFR
Anion gap: 11 (ref 5–15)
BUN: 10 mg/dL (ref 8–23)
CO2: 28 mmol/L (ref 22–32)
Calcium: 9.2 mg/dL (ref 8.9–10.3)
Chloride: 96 mmol/L — ABNORMAL LOW (ref 98–111)
Creatinine, Ser: 0.53 mg/dL (ref 0.44–1.00)
GFR, Estimated: >60 mL/min (ref >60)
Glucose, Bld: 139 mg/dL — ABNORMAL HIGH (ref 70–99)
Potassium: 4 mmol/L (ref 3.5–5.1)
Sodium: 135 mmol/L (ref 135–145)

## 2023-12-25 LAB — CBC WITH DIFFERENTIAL/PLATELET
Abs Immature Granulocytes: 0.02 10*3/uL (ref 0.00–0.07)
Basophils Absolute: 0 10*3/uL (ref 0.0–0.1)
Basophils Relative: 0 %
Eosinophils Absolute: 0.2 10*3/uL (ref 0.0–0.5)
Eosinophils Relative: 3 %
HCT: 34.7 % — ABNORMAL LOW (ref 36.0–46.0)
Hemoglobin: 11.5 g/dL — ABNORMAL LOW (ref 12.0–15.0)
Immature Granulocytes: 0 %
Lymphocytes Relative: 12 %
Lymphs Abs: 0.6 10*3/uL — ABNORMAL LOW (ref 0.7–4.0)
MCH: 31.3 pg (ref 26.0–34.0)
MCHC: 33.1 g/dL (ref 30.0–36.0)
MCV: 94.3 fL (ref 80.0–100.0)
Monocytes Absolute: 0.6 10*3/uL (ref 0.1–1.0)
Monocytes Relative: 12 %
Neutro Abs: 3.4 10*3/uL (ref 1.7–7.7)
Neutrophils Relative %: 73 %
Platelets: 187 10*3/uL (ref 150–400)
RBC: 3.68 MIL/uL — ABNORMAL LOW (ref 3.87–5.11)
RDW: 13.4 % (ref 11.5–15.5)
WBC: 4.7 10*3/uL (ref 4.0–10.5)
nRBC: 0 % (ref 0.0–0.2)

## 2023-12-25 LAB — RESPIRATORY PANEL BY PCR

## 2023-12-25 LAB — SARS CORONAVIRUS 2 BY RT PCR: SARS Coronavirus 2 by RT PCR: NEGATIVE

## 2023-12-25 LAB — RESP PANEL BY RT-PCR (RSV, FLU A&B, COVID)  RVPGX2
Influenza A by PCR: NEGATIVE
Influenza B by PCR: NEGATIVE
Resp Syncytial Virus by PCR: NEGATIVE
SARS Coronavirus 2 by RT PCR: NEGATIVE

## 2023-12-25 LAB — CREATININE, SERUM
Creatinine, Ser: 0.58 mg/dL (ref 0.44–1.00)
GFR, Estimated: >60 mL/min (ref >60)

## 2023-12-25 MED ORDER — VITAMIN B-12 1000 MCG PO TABS
1000.0000 ug | ORAL_TABLET | Freq: Every day | ORAL | Status: DC
  Administered 2023-12-26 – 2023-12-28 (×3): 1000 ug via ORAL
  Filled 2023-12-25 (×3): qty 1

## 2023-12-25 MED ORDER — METHYLPREDNISOLONE SODIUM SUCC 40 MG IJ SOLR
40.0000 mg | Freq: Two times a day (BID) | INTRAMUSCULAR | Status: AC
  Administered 2023-12-25 – 2023-12-26 (×2): 40 mg via INTRAVENOUS
  Filled 2023-12-25 (×2): qty 1

## 2023-12-25 MED ORDER — PRAMIPEXOLE DIHYDROCHLORIDE 0.25 MG PO TABS
0.5000 mg | ORAL_TABLET | Freq: Every day | ORAL | Status: DC
  Administered 2023-12-25: 0.5 mg via ORAL
  Filled 2023-12-25: qty 2

## 2023-12-25 MED ORDER — IPRATROPIUM-ALBUTEROL 0.5-2.5 (3) MG/3ML IN SOLN
3.0000 mL | Freq: Once | RESPIRATORY_TRACT | Status: AC
  Administered 2023-12-25: 3 mL via RESPIRATORY_TRACT
  Filled 2023-12-25: qty 3

## 2023-12-25 MED ORDER — PANTOPRAZOLE SODIUM 40 MG PO TBEC
40.0000 mg | DELAYED_RELEASE_TABLET | Freq: Two times a day (BID) | ORAL | Status: DC
  Administered 2023-12-25 – 2023-12-28 (×6): 40 mg via ORAL
  Filled 2023-12-25 (×6): qty 1

## 2023-12-25 MED ORDER — PREDNISONE 50 MG PO TABS
60.0000 mg | ORAL_TABLET | Freq: Once | ORAL | Status: AC
  Administered 2023-12-25: 60 mg via ORAL
  Filled 2023-12-25: qty 1

## 2023-12-25 MED ORDER — FERROUS SULFATE 325 (65 FE) MG PO TABS
325.0000 mg | ORAL_TABLET | Freq: Every day | ORAL | Status: DC
  Administered 2023-12-26 – 2023-12-28 (×3): 325 mg via ORAL
  Filled 2023-12-25 (×3): qty 1

## 2023-12-25 MED ORDER — ALBUTEROL SULFATE (2.5 MG/3ML) 0.083% IN NEBU
2.5000 mg | INHALATION_SOLUTION | Freq: Once | RESPIRATORY_TRACT | Status: AC
  Administered 2023-12-25: 2.5 mg via RESPIRATORY_TRACT
  Filled 2023-12-25: qty 3

## 2023-12-25 MED ORDER — GABAPENTIN 300 MG PO CAPS
600.0000 mg | ORAL_CAPSULE | Freq: Every day | ORAL | Status: DC
  Administered 2023-12-25 – 2023-12-27 (×3): 600 mg via ORAL
  Filled 2023-12-25 (×3): qty 2

## 2023-12-25 MED ORDER — ONDANSETRON HCL 4 MG PO TABS: 4.0000 mg | ORAL_TABLET | Freq: Four times a day (QID) | ORAL | Status: DC | PRN

## 2023-12-25 MED ORDER — TRAMADOL HCL 50 MG PO TABS
50.0000 mg | ORAL_TABLET | Freq: Four times a day (QID) | ORAL | Status: DC | PRN
  Filled 2023-12-25: qty 1

## 2023-12-25 MED ORDER — IPRATROPIUM-ALBUTEROL 0.5-2.5 (3) MG/3ML IN SOLN
3.0000 mL | Freq: Four times a day (QID) | RESPIRATORY_TRACT | Status: DC
  Administered 2023-12-25 – 2023-12-27 (×9): 3 mL via RESPIRATORY_TRACT
  Filled 2023-12-25 (×10): qty 3

## 2023-12-25 MED ORDER — FLUTICASONE FUROATE-VILANTEROL 100-25 MCG/ACT IN AEPB: 1.0000 | INHALATION_SPRAY | Freq: Every day | RESPIRATORY_TRACT | Status: DC

## 2023-12-25 MED ORDER — ACETAMINOPHEN 650 MG RE SUPP: 650.0000 mg | Freq: Four times a day (QID) | RECTAL | Status: DC | PRN

## 2023-12-25 MED ORDER — ALBUTEROL SULFATE (2.5 MG/3ML) 0.083% IN NEBU
5.0000 mg | INHALATION_SOLUTION | RESPIRATORY_TRACT | Status: DC | PRN
  Administered 2023-12-25: 5 mg via RESPIRATORY_TRACT
  Filled 2023-12-25: qty 6

## 2023-12-25 MED ORDER — FLUTICASONE FUROATE-VILANTEROL 200-25 MCG/ACT IN AEPB
1.0000 | INHALATION_SPRAY | Freq: Every day | RESPIRATORY_TRACT | Status: DC
  Administered 2023-12-25 – 2023-12-28 (×3): 1 via RESPIRATORY_TRACT
  Filled 2023-12-25: qty 28

## 2023-12-25 MED ORDER — PREDNISONE 20 MG PO TABS: 40.0000 mg | ORAL_TABLET | Freq: Every day | ORAL | Status: DC

## 2023-12-25 MED ORDER — SODIUM CHLORIDE 0.9 % IV SOLN
500.0000 mg | INTRAVENOUS | Status: AC
  Administered 2023-12-25: 500 mg via INTRAVENOUS
  Filled 2023-12-25: qty 5

## 2023-12-25 MED ORDER — ACETAMINOPHEN 325 MG PO TABS: 650.0000 mg | ORAL_TABLET | Freq: Four times a day (QID) | ORAL | Status: DC | PRN

## 2023-12-25 MED ORDER — SERTRALINE HCL 50 MG PO TABS
50.0000 mg | ORAL_TABLET | Freq: Every day | ORAL | Status: DC
  Administered 2023-12-25 – 2023-12-27 (×3): 50 mg via ORAL
  Filled 2023-12-25 (×3): qty 1

## 2023-12-25 MED ORDER — ALBUTEROL SULFATE (2.5 MG/3ML) 0.083% IN NEBU
5.0000 mg | INHALATION_SOLUTION | Freq: Once | RESPIRATORY_TRACT | Status: AC
  Administered 2023-12-25: 5 mg via RESPIRATORY_TRACT
  Filled 2023-12-25: qty 6

## 2023-12-25 MED ORDER — ONDANSETRON HCL 4 MG/2ML IJ SOLN: 4.0000 mg | Freq: Four times a day (QID) | INTRAMUSCULAR | Status: DC | PRN

## 2023-12-25 MED ORDER — SERTRALINE HCL 50 MG PO TABS: 50.0000 mg | ORAL_TABLET | Freq: Every day | ORAL | Status: DC

## 2023-12-25 MED ORDER — IPRATROPIUM BROMIDE 0.02 % IN SOLN
0.5000 mg | Freq: Once | RESPIRATORY_TRACT | Status: AC
  Administered 2023-12-25: 0.5 mg via RESPIRATORY_TRACT
  Filled 2023-12-25: qty 2.5

## 2023-12-25 MED ORDER — AZITHROMYCIN 250 MG PO TABS
500.0000 mg | ORAL_TABLET | Freq: Every day | ORAL | Status: AC
  Administered 2023-12-26 – 2023-12-27 (×2): 500 mg via ORAL
  Filled 2023-12-25 (×2): qty 2

## 2023-12-25 MED ORDER — ENOXAPARIN SODIUM 40 MG/0.4ML IJ SOSY
40.0000 mg | PREFILLED_SYRINGE | INTRAMUSCULAR | Status: DC
  Administered 2023-12-25 – 2023-12-27 (×3): 40 mg via SUBCUTANEOUS
  Filled 2023-12-25 (×3): qty 0.4

## 2023-12-25 NOTE — ED Provider Notes (Signed)
 [facility] AT [facility] Provider Note   CSN: [id number] Arrival date & time: 12/25/23  9095     Patient presents with: Shortness of Breath   Lindsay Porter is a 83 y.o. female.   Patient is an 83 year old female with a history of ongoing alcohol use, GERD, iron deficiency anemia, COPD on daily Symbicort  who is presenting today with several symptoms.  She reports 5 days ago she developed sore throat, nasal congestion, ear pain and a dry cough.  The symptoms have persisted but she has becoming increasingly short of breath especially with exertion.  She is using her albuterol  at home which she feels helps a little bit but symptoms are not improving.  She denies any fever, chest pain, abdominal pain, nausea vomiting or diarrhea.  She reports her daughter has similar symptoms.  She did start taking Sudafed 2 days ago and feels like it has dried up some of her nasal congestion a little bit and helps some with her ear pain.  The history is provided by the patient.  Shortness of Breath      Prior to Admission medications   Medication Sig Start Date End Date Taking? Authorizing Provider  albuterol  (VENTOLIN  HFA) 108 (90 Base) MCG/ACT inhaler INHALE 2 PUFFS INTO THE LUNGS EVERY 6 HOURS AS NEEDED FOR WHEEZING OR SHORTNESS OF BREATH 12/24/23   Kara Dorn NOVAK, MD  budesonide -formoterol  (SYMBICORT ) 160-4.5 MCG/ACT inhaler INHALE 2 PUFFS INTO THE LUNGS TWICE DAILY 04/24/22   Kara Dorn NOVAK, MD  cyanocobalamin (VITAMIN B12) 1000 MCG tablet Take 1,000 mcg by mouth daily. 08/08/22   [provider]  ferrous sulfate 325 (65 FE) MG tablet Take 325 mg by mouth daily with breakfast. 09/10/19   [provider]  fluticasone  (FLONASE ) 50 MCG/ACT nasal spray SHAKE LIQUID AND USE 2 SPRAYS IN EACH NOSTRIL DAILY 01/15/23   Kara Dorn NOVAK, MD  gabapentin  (NEURONTIN ) 300 MG capsule Take 600 mg by mouth at bedtime.    [provider]  loratadine  (CLARITIN) 10 MG tablet Take 20 mg by mouth daily.    [provider]  meloxicam  (MOBIC ) 15 MG tablet Take 15 mg by mouth daily. 12/28/20   [provider]  pantoprazole  (PROTONIX ) 40 MG tablet Take 1 tablet (40 mg total) by mouth 2 (two) times daily. 10/22/23   Abran Norleen SAILOR, MD  pramipexole (MIRAPEX) 0.5 MG tablet Take 0.5 mg by mouth daily.    [provider]  sertraline  (ZOLOFT ) 100 MG tablet Take 100 mg by mouth daily. 08/27/20   [provider]  traMADol  (ULTRAM ) 50 MG tablet Take 1 tablet (50 mg total) by mouth every 6 (six) hours as needed for moderate pain or severe pain. Patient not taking: Reported on 11/05/2023 10/11/22   Belinda Cough, MD    Allergies: Singulair [montelukast sodium] and Sulfa antibiotics    Review of Systems  Respiratory:  Positive for shortness of breath.     Updated Vital Signs BP 134/71   Pulse 88   Temp 98.6 F (37 C) (Oral)   Resp 20   SpO2 90%   Physical Exam Vitals and nursing note reviewed.  Constitutional:      General: She is not in acute distress.    Appearance: She is well-developed.  HENT:     Head: Normocephalic and atraumatic.     Right Ear: A middle ear effusion is present. Tympanic membrane is erythematous.     Left Ear: Tympanic membrane normal.  Nose:     Right Turbinates: Swollen.     Left Turbinates: Swollen.     Mouth/Throat:     Mouth: Mucous membranes are dry.     Pharynx: Oropharynx is clear.   Eyes:     Pupils: Pupils are equal, round, and reactive to light.    Cardiovascular:     Rate and Rhythm: Regular rhythm. Tachycardia present.     Heart sounds: Normal heart sounds. No murmur heard.    No friction rub.  Pulmonary:     Effort: Pulmonary effort is normal. Tachypnea and prolonged expiration present.     Breath sounds: Decreased air movement present. Wheezing present. No rales.  Chest:     Chest wall: No tenderness.  Abdominal:     General: Bowel sounds are normal. There is  no distension.     Palpations: Abdomen is soft.     Tenderness: There is no abdominal tenderness. There is no guarding or rebound.   Musculoskeletal:        General: No tenderness. Normal range of motion.     Cervical back: Neck supple.     Right lower leg: No edema.     Left lower leg: No edema.     Comments: No edema   Skin:    General: Skin is warm and dry.     Findings: No rash.   Neurological:     Mental Status: She is alert and oriented to person, place, and time. Mental status is at baseline.     Cranial Nerves: No cranial nerve deficit.   Psychiatric:        Behavior: Behavior normal.     (all labs ordered are listed, but only abnormal results are displayed) Labs Reviewed  CBC WITH DIFFERENTIAL/PLATELET - Abnormal; Notable for the following components:      Result Value   RBC 3.68 (*)    Hemoglobin 11.5 (*)    HCT 34.7 (*)    Lymphs Abs 0.6 (*)    All other components within normal limits  BASIC METABOLIC PANEL WITH GFR - Abnormal; Notable for the following components:   Chloride 96 (*)    Glucose, Bld 139 (*)    All other components within normal limits  SARS CORONAVIRUS 2 BY RT PCR    EKG: EKG Interpretation Date/Time:  Tuesday December 25 2023 09:11:23 EDT Ventricular Rate:  98 PR Interval:  147 QRS Duration:  83 QT Interval:  333 QTC Calculation: 426 R Axis:   79  Text Interpretation: Sinus rhythm No significant change since last tracing Confirmed by Doretha Folks ([id number]) on 12/25/2023 9:17:47 AM  Radiology: [facility] Chest Port 1 View Result Date: 12/25/2023 CLINICAL DATA:  Shortness of breath. EXAM: PORTABLE CHEST 1 VIEW COMPARISON:  August 13, 2023. FINDINGS: Stable cardiomediastinal silhouette. Minimal bibasilar subsegmental atelectasis or scarring is noted. Degenerative changes are seen involving both glenohumeral joints. IMPRESSION: Minimal bibasilar subsegmental atelectasis or scarring. Electronically Signed   By: Lynwood Landy Raddle M.D.   On:  12/25/2023 10:33     Procedures   Medications Ordered in the ED  albuterol  (PROVENTIL ) (2.5 MG/3ML) 0.083% nebulizer solution 5 mg (has no administration in time range)  ipratropium-albuterol  (DUONEB) 0.5-2.5 (3) MG/3ML nebulizer solution 3 mL (3 mLs Nebulization Given 12/25/23 0925)  albuterol  (PROVENTIL ) (2.5 MG/3ML) 0.083% nebulizer solution 2.5 mg (2.5 mg Nebulization Given 12/25/23 0925)  predniSONE  (DELTASONE ) tablet 60 mg (60 mg Oral Given 12/25/23 0939)  albuterol  (PROVENTIL ) (2.5 MG/3ML) 0.083% nebulizer solution 5 mg (  5 mg Nebulization Given 12/25/23 1050)  ipratropium (ATROVENT) nebulizer solution 0.5 mg (0.5 mg Nebulization Given 12/25/23 1050)                                    Medical Decision Making Amount and/or Complexity of Data Reviewed Independent Historian: spouse External Data Reviewed: notes. Labs: ordered. Decision-making details documented in ED Course. Radiology: ordered and independent interpretation performed. Decision-making details documented in ED Course. ECG/medicine tests: ordered and independent interpretation performed. Decision-making details documented in ED Course.  Risk Prescription drug management.   Pt with multiple medical problems and comorbidities and presenting today with a complaint that caries a high risk for morbidity and mortality.  Here today with shortness of breath.  Patient was satting 75% upon arrival here and was placed on 2 L nasal cannula with improvement of oxygen to 90 to 91%.  Patient does not wear oxygen at home.  She has been having URI symptoms over the last 5 days and does have a history of COPD.  Concern for pneumonia, bronchitis, COPD exacerbation in light of a viral illness, COVID.  Lower suspicion for CHF or cardiac cause or patient's symptoms.  Low suspicion for PE.  DuoNeb, prednisone  given.  12:44 PM I independently interpreted patient's labs and EKG.  EKG was sinus rhythm without acute findings.  CBC with out acute  findings, BMP within normal limits. I have independently visualized and interpreted pt's images today.  Chest x-ray with chronic changes but no evidence of significant infiltrate.  12:44 PM On repeat evaluation patient still wheezing significantly and was given another treatment.  Still currently requiring 2 L of oxygen.  Suspect at this point COPD exacerbation most likely from URI.  12:44 PM Patient still having ongoing wheezing and still requiring oxygen.  Sats between 86 and 88% she was turned up to 3 L.  She subjectively reports feeling better but even with getting up to use the bathroom sats dropped into the low 80s.  Feel that patient will need admission for COPD exacerbation possibly bronchitis.  Will continue treatments here and consult to the hospitalist for admission.  Discussed this with the patient and her husband and they are comfortable with this plan.  CRITICAL CARE Performed by: Jacky Hartung Total critical care time: 45 minutes Critical care time was exclusive of separately billable procedures and treating other patients. Critical care was necessary to treat or prevent imminent or life-threatening deterioration. Critical care was time spent personally by me on the following activities: development of treatment plan with patient and/or surrogate as well as nursing, discussions with consultants, evaluation of patient's response to treatment, examination of patient, obtaining history from patient or surrogate, ordering and performing treatments and interventions, ordering and review of laboratory studies, ordering and review of radiographic studies, pulse oximetry and re-evaluation of patient's condition.       Final diagnoses:  COPD with acute exacerbation [facility])  Hypoxia    ED Discharge Orders     None          Doretha Folks, MD 12/25/23 1244

## 2023-12-25 NOTE — H&P (Signed)
 History and Physical    Lindsay Porter FMW:[id number] DOB: 01-May-1941 DOA: 12/25/2023  PCP: Mathew Bouchard, DO  Patient coming from: Home  I have personally briefly reviewed patient's old medical records in [facility]  Chief Complaint: Shortness of breath  HPI: Lindsay Porter is a 83 y.o. female with medical history significant of alcohol use disorder, COPD, GERD presented to  [facility] ED with the complaint of runny nose, cough and shortness of breath for last 5 days.  No fever documented or noted at home.  Symptoms continued to get worse so she decided to come to the ED.  No treatment has been received by the patient prior to coming to the ED today.  She denies any sick contact.  Denies any chest pain, palpitation, chills, sweating, nausea, vomiting, any problem with urination or with bowel movement.  No sick contacts.  She sees her pulmonologist and saw him last about a month ago.  Per daughter who is at the bedside, she is up-to-date.  She admits to drinking 3 to 5 glasses of wine every day.  Last alcoholic drink was 3 to 4 days ago.  Never had any history of alcohol withdrawal.  ED Course: Upon arrival to ED, patient was tachypneic and hypoxic to 75% requiring 2 L of oxygen.  When she ambulated, she was requiring 3 L of oxygen.  She also developed some tachycardia.  Chest x-ray unremarkable.  Patient was wheezy upon arrival.  Received albuterol  inhaler, DuoNeb as well as prednisone  60 mg p.o. no antibiotics given.  Tested negative for RSV, COVID and influenza.  No leukocytosis.  BMP unremarkable.  Hospitalist were called for admission.  Review of Systems: As per HPI otherwise negative.    Past Medical History:  Diagnosis Date   Alcohol abuse    Allergy    Anemia, iron deficiency    Arthritis    Carpal tunnel syndrome    COPD (chronic obstructive pulmonary disease) (HCC)    Depression    Diverticulosis    Dyspnea    GERD (gastroesophageal reflux disease)    Hiatal hernia     Hyperlipidemia    Insomnia    Osteopenia    Ovarian cyst    Pneumonia 07/2022   Pre-diabetes    Restless leg syndrome    Seasonal allergies    Vitamin D deficiency    Wears contact lenses     Past Surgical History:  Procedure Laterality Date   ANKLE SURGERY  07/07/2009   left   APPENDECTOMY     BREAST LUMPECTOMY Left    BUNIONECTOMY  2006   left   CARPAL TUNNEL RELEASE Right    COLONOSCOPY     HIATAL HERNIA REPAIR  04/19/2012   Procedure: LAPAROSCOPIC REPAIR OF HIATAL HERNIA;  Surgeon: Morene ONEIDA Olives, MD;  Location: [facility];  Service: General;  Laterality: N/A;   INGUINAL HERNIA REPAIR Right 06/04/2014   Procedure: HERNIA REPAIR INGUINAL ADULT;  Surgeon: Donnice Lima, MD;  Location: [facility];  Service: General;  Laterality: Right;   INGUINAL HERNIA REPAIR Left 10/11/2022   Procedure: LEFT HERNIA REPAIR INGUINAL ADULT WITH MESH;  Surgeon: Lima Donnice, MD;  Location: [facility] OR;  Service: General;  Laterality: Left;  TAP BLOCK   INSERTION OF MESH Right 06/04/2014   Procedure: INSERTION OF MESH;  Surgeon: Donnice Lima, MD;  Location: [facility];  Service: General;  Laterality: Right;   INSERTION OF MESH Left 10/11/2022   Procedure: INSERTION OF  MESH;  Surgeon: Belinda Cough, MD;  Location: [facility] OR;  Service: General;  Laterality: Left;   KNEE ARTHROPLASTY Left    KNEE SURGERY  2005   lt   nose surgery  12/2010   SHOULDER SURGERY  1950   right   TONSILLECTOMY     UPPER GASTROINTESTINAL ENDOSCOPY     UPPER GI ENDOSCOPY       reports that she quit smoking about 16 years ago. Her smoking use included cigarettes. She started smoking about 61 years ago. She has a 90 pack-year smoking history. She has never used smokeless tobacco. She reports current alcohol use of about 21.0 standard drinks of alcohol per week. She reports that she does not use drugs.  Allergies  Allergen Reactions   Singulair [Montelukast Sodium] Hives   Sulfa Antibiotics  Other (See Comments)    Childhood reaction     Family History  Problem Relation Age of Onset   Rheum arthritis Mother    Asthma Mother    Alcoholism Father    Arthritis Sister    Colon cancer Neg Hx    Colon polyps Neg Hx    Esophageal cancer Neg Hx    Pancreatic cancer Neg Hx    Stomach cancer Neg Hx    Rectal cancer Neg Hx    Breast cancer Neg Hx     Prior to Admission medications   Medication Sig Start Date End Date Taking? Authorizing Provider  albuterol  (VENTOLIN  HFA) 108 (90 Base) MCG/ACT inhaler INHALE 2 PUFFS INTO THE LUNGS EVERY 6 HOURS AS NEEDED FOR WHEEZING OR SHORTNESS OF BREATH 12/24/23   Kara Dorn NOVAK, MD  budesonide -formoterol  (SYMBICORT ) 160-4.5 MCG/ACT inhaler INHALE 2 PUFFS INTO THE LUNGS TWICE DAILY 04/24/22   Kara Dorn NOVAK, MD  cyanocobalamin (VITAMIN B12) 1000 MCG tablet Take 1,000 mcg by mouth daily. 08/08/22   [provider]  ferrous sulfate 325 (65 FE) MG tablet Take 325 mg by mouth daily with breakfast. 09/10/19   [provider]  fluticasone  (FLONASE ) 50 MCG/ACT nasal spray SHAKE LIQUID AND USE 2 SPRAYS IN EACH NOSTRIL DAILY 01/15/23   Kara Dorn NOVAK, MD  gabapentin  (NEURONTIN ) 300 MG capsule Take 600 mg by mouth at bedtime.    [provider]  loratadine (CLARITIN) 10 MG tablet Take 20 mg by mouth daily.    [provider]  meloxicam  (MOBIC ) 15 MG tablet Take 15 mg by mouth daily. 12/28/20   [provider]  pantoprazole  (PROTONIX ) 40 MG tablet Take 1 tablet (40 mg total) by mouth 2 (two) times daily. 10/22/23   Abran Norleen SAILOR, MD  pramipexole (MIRAPEX) 0.5 MG tablet Take 0.5 mg by mouth daily.    [provider]  sertraline  (ZOLOFT ) 100 MG tablet Take 100 mg by mouth daily. 08/27/20   [provider]  traMADol  (ULTRAM ) 50 MG tablet Take 1 tablet (50 mg total) by mouth every 6 (six) hours as needed for moderate pain or severe pain. 10/11/22   Belinda Cough, MD    Physical Exam: Vitals:    12/25/23 1330 12/25/23 1331 12/25/23 1333 12/25/23 1443  BP: (!) 129/102  (!) 103/56 (!) 127/92  Pulse: (!) 103  94 (!) 116  Resp: (!) 24  (!) 23 16  Temp:  98.4 F (36.9 C)  98.7 F (37.1 C)  TempSrc:  Axillary  Oral  SpO2: 95%  94% 93%  Weight:    74.9 kg    Constitutional: NAD, calm, comfortable Vitals:   12/25/23  1330 12/25/23 1331 12/25/23 1333 12/25/23 1443  BP: (!) 129/102  (!) 103/56 (!) 127/92  Pulse: (!) 103  94 (!) 116  Resp: (!) 24  (!) 23 16  Temp:  98.4 F (36.9 C)  98.7 F (37.1 C)  TempSrc:  Axillary  Oral  SpO2: 95%  94% 93%  Weight:    74.9 kg   Eyes: PERRL, lids and conjunctivae normal ENMT: Mucous membranes are moist. Posterior pharynx clear of any exudate or lesions.Normal dentition.  Neck: normal, supple, no masses, no thyromegaly Respiratory: clear to auscultation bilaterally, no wheezing, no crackles. Normal respiratory effort. No accessory muscle use.  Cardiovascular: Regular rate and rhythm, no murmurs / rubs / gallops. No extremity edema. 2+ pedal pulses. No carotid bruits.  Abdomen: no tenderness, no masses palpated. No hepatosplenomegaly. Bowel sounds positive.  Musculoskeletal: no clubbing / cyanosis. No joint deformity upper and lower extremities. Good ROM, no contractures. Normal muscle tone.  Skin: no rashes, lesions, ulcers. No induration Neurologic: CN 2-12 grossly intact. Sensation intact, DTR normal. Strength 5/5 in all 4.  Psychiatric: Normal judgment and insight. Alert and oriented x 3. Normal mood.    Labs on Admission: I have personally reviewed following labs and imaging studies  CBC: Recent Labs  Lab 12/25/23 0940  WBC 4.7  NEUTROABS 3.4  HGB 11.5*  HCT 34.7*  MCV 94.3  PLT 187   Basic Metabolic Panel: Recent Labs  Lab 12/25/23 0940  NA 135  K 4.0  CL 96*  CO2 28  GLUCOSE 139*  BUN 10  CREATININE 0.53  CALCIUM 9.2   GFR: Estimated Creatinine Clearance: 57 mL/min (by C-G formula based on SCr of 0.53  mg/dL). Liver Function Tests: No results for input(s): AST, ALT, ALKPHOS, BILITOT, PROT, ALBUMIN in the last 168 hours. No results for input(s): LIPASE, AMYLASE in the last 168 hours. No results for input(s): AMMONIA in the last 168 hours. Coagulation Profile: No results for input(s): INR, PROTIME in the last 168 hours. Cardiac Enzymes: No results for input(s): CKTOTAL, CKMB, CKMBINDEX, TROPONINI in the last 168 hours. BNP (last 3 results) No results for input(s): PROBNP in the last 8760 hours. HbA1C: No results for input(s): HGBA1C in the last 72 hours. CBG: No results for input(s): GLUCAP in the last 168 hours. Lipid Profile: No results for input(s): CHOL, HDL, LDLCALC, TRIG, CHOLHDL, LDLDIRECT in the last 72 hours. Thyroid  Function Tests: No results for input(s): TSH, T4TOTAL, FREET4, T3FREE, THYROIDAB in the last 72 hours. Anemia Panel: No results for input(s): VITAMINB12, FOLATE, FERRITIN, TIBC, IRON, RETICCTPCT in the last 72 hours. Urine analysis:    Component Value Date/Time   COLORURINE YELLOW 12/27/2014 2040   APPEARANCEUR CLOUDY (A) 12/27/2014 2040   LABSPEC 1.027 12/27/2014 2040   PHURINE 5.0 12/27/2014 2040   GLUCOSEU NEGATIVE 12/27/2014 2040   HGBUR NEGATIVE 12/27/2014 2040   BILIRUBINUR SMALL (A) 12/27/2014 2040   KETONESUR 40 (A) 12/27/2014 2040   PROTEINUR NEGATIVE 12/27/2014 2040   UROBILINOGEN 0.2 12/27/2014 2040   NITRITE NEGATIVE 12/27/2014 2040   LEUKOCYTESUR NEGATIVE 12/27/2014 2040    Radiological Exams on Admission: DG Chest Port 1 View Result Date: 12/25/2023 CLINICAL DATA:  Shortness of breath. EXAM: PORTABLE CHEST 1 VIEW COMPARISON:  August 13, 2023. FINDINGS: Stable cardiomediastinal silhouette. Minimal bibasilar subsegmental atelectasis or scarring is noted. Degenerative changes are seen involving both glenohumeral joints. IMPRESSION: Minimal bibasilar subsegmental atelectasis  or scarring. Electronically Signed   By: Lynwood Landy Raddle M.D.   On:  12/25/2023 10:33    EKG: Independently reviewed.  Sinus rhythm with no acute ST-T wave changes  Assessment/Plan Active Problems:   Alcohol dependence (HCC)   COPD with acute exacerbation (HCC)   Acute respiratory failure with hypoxia (HCC)   Acute hypoxic respiratory failure secondary to acute COPD exacerbation: Resume home inhalers, start on DuoNeb as needed as well as daily Solu-Medrol and azithromycin.  Wean oxygen as able to.  Incentive spirometry.  Patient is not wheezy as she was in the ED so that indicates that she has already improved.  She is able to speak in full sentences and currently requiring 2 L of oxygen.  She has diminished breath sounds.  Depression: Resume Zoloft .  Ongoing alcohol abuse: 3 to 5 glasses of wine every day but last alcoholic drink was 3 to 4 days ago.  She does not have any withdrawal symptoms at the moment, I doubt she will have any withdrawal.  We will just monitor her for now.  GERD: Resume PPI.  DVT prophylaxis: enoxaparin (LOVENOX) injection 40 mg Start: 12/25/23 1545 Code Status: Full code Family Communication: Husband and daughter present at bedside.  Plan of care discussed with patient in length and he verbalized understanding and agreed with it. Disposition Plan: Potential discharge tomorrow Consults called: None  Fredia Skeeter MD [facility]  *Please note that this is a verbal dictation therefore any spelling or grammatical errors are due to the Dragon [employer] One system interpretation.  Please page via Amion and do not message via secure chat for urgent patient care matters. Secure chat can be used for non urgent patient care matters. 12/25/2023, 2:58 PM  To contact the attending provider between 7A-7P or the covering provider during after hours 7P-7A, please log into the web site [email or web address]

## 2023-12-25 NOTE — ED Notes (Signed)
 RT Note: Upon assessment the patient had to be placed on 2lpm [place] to get her oxygen saturation above 75% on room air. Currently her oxygen saturation is 91% on 2lpm [place]

## 2023-12-25 NOTE — ED Notes (Signed)
 RT Note: Patient handled the breathing treatment well

## 2023-12-25 NOTE — ED Triage Notes (Signed)
 C/o SHOB starting Friday. Hx of COPD. No home oxygen use. Audible wheezing. Sats 83% during triage.   Sore throat, headache, and R ear pain.

## 2023-12-25 NOTE — ED Notes (Signed)
 Pt stood to use the bed side commode, while on 2L Nasal O2. Pt's O2 dropped to the low 80's while up and moving around. Pt visually more short of breath while moving around as well. Pt placed back in bed and continuing Monitor. Instructed to breath in her nose and out her mouth. Kept Pt at 2L Nasal O2. Pt currently back up to 90%.

## 2023-12-26 ENCOUNTER — Observation Stay (HOSPITAL_COMMUNITY)

## 2023-12-26 DIAGNOSIS — J441 Chronic obstructive pulmonary disease with (acute) exacerbation: Secondary | ICD-10-CM | POA: Diagnosis present

## 2023-12-26 DIAGNOSIS — R9431 Abnormal electrocardiogram [ECG] [EKG]: Secondary | ICD-10-CM | POA: Diagnosis not present

## 2023-12-26 DIAGNOSIS — Z1152 Encounter for screening for COVID-19: Secondary | ICD-10-CM | POA: Diagnosis not present

## 2023-12-26 DIAGNOSIS — F32A Depression, unspecified: Secondary | ICD-10-CM | POA: Diagnosis present

## 2023-12-26 DIAGNOSIS — Z87891 Personal history of nicotine dependence: Secondary | ICD-10-CM | POA: Diagnosis not present

## 2023-12-26 DIAGNOSIS — D649 Anemia, unspecified: Secondary | ICD-10-CM | POA: Diagnosis present

## 2023-12-26 DIAGNOSIS — R7303 Prediabetes: Secondary | ICD-10-CM | POA: Diagnosis present

## 2023-12-26 DIAGNOSIS — I272 Pulmonary hypertension, unspecified: Secondary | ICD-10-CM | POA: Diagnosis present

## 2023-12-26 DIAGNOSIS — J440 Chronic obstructive pulmonary disease with acute lower respiratory infection: Secondary | ICD-10-CM | POA: Diagnosis present

## 2023-12-26 DIAGNOSIS — Z79899 Other long term (current) drug therapy: Secondary | ICD-10-CM | POA: Diagnosis not present

## 2023-12-26 DIAGNOSIS — J9601 Acute respiratory failure with hypoxia: Secondary | ICD-10-CM | POA: Diagnosis present

## 2023-12-26 DIAGNOSIS — Z888 Allergy status to other drugs, medicaments and biological substances status: Secondary | ICD-10-CM | POA: Diagnosis not present

## 2023-12-26 DIAGNOSIS — Z8261 Family history of arthritis: Secondary | ICD-10-CM | POA: Diagnosis not present

## 2023-12-26 DIAGNOSIS — Z825 Family history of asthma and other chronic lower respiratory diseases: Secondary | ICD-10-CM | POA: Diagnosis not present

## 2023-12-26 DIAGNOSIS — F1020 Alcohol dependence, uncomplicated: Secondary | ICD-10-CM | POA: Diagnosis present

## 2023-12-26 DIAGNOSIS — G2581 Restless legs syndrome: Secondary | ICD-10-CM | POA: Diagnosis present

## 2023-12-26 DIAGNOSIS — Z791 Long term (current) use of non-steroidal anti-inflammatories (NSAID): Secondary | ICD-10-CM | POA: Diagnosis not present

## 2023-12-26 DIAGNOSIS — Z811 Family history of alcohol abuse and dependence: Secondary | ICD-10-CM | POA: Diagnosis not present

## 2023-12-26 DIAGNOSIS — R0902 Hypoxemia: Secondary | ICD-10-CM | POA: Diagnosis present

## 2023-12-26 DIAGNOSIS — Z882 Allergy status to sulfonamides status: Secondary | ICD-10-CM | POA: Diagnosis not present

## 2023-12-26 DIAGNOSIS — Z96652 Presence of left artificial knee joint: Secondary | ICD-10-CM | POA: Diagnosis present

## 2023-12-26 DIAGNOSIS — E785 Hyperlipidemia, unspecified: Secondary | ICD-10-CM | POA: Diagnosis present

## 2023-12-26 DIAGNOSIS — Z7951 Long term (current) use of inhaled steroids: Secondary | ICD-10-CM | POA: Diagnosis not present

## 2023-12-26 DIAGNOSIS — K219 Gastro-esophageal reflux disease without esophagitis: Secondary | ICD-10-CM | POA: Diagnosis present

## 2023-12-26 DIAGNOSIS — J208 Acute bronchitis due to other specified organisms: Secondary | ICD-10-CM | POA: Diagnosis present

## 2023-12-26 LAB — BASIC METABOLIC PANEL WITH GFR
Anion gap: 10 (ref 5–15)
BUN: 10 mg/dL (ref 8–23)
CO2: 25 mmol/L (ref 22–32)
Calcium: 8.6 mg/dL — ABNORMAL LOW (ref 8.9–10.3)
Chloride: 99 mmol/L (ref 98–111)
Creatinine, Ser: 0.47 mg/dL (ref 0.44–1.00)
GFR, Estimated: >60 mL/min (ref >60)
Glucose, Bld: 158 mg/dL — ABNORMAL HIGH (ref 70–99)
Potassium: 4.2 mmol/L (ref 3.5–5.1)
Sodium: 134 mmol/L — ABNORMAL LOW (ref 135–145)

## 2023-12-26 LAB — ECHOCARDIOGRAM COMPLETE
Area-P 1/2: 4.89 cm2
Height: 60 in
S' Lateral: 3 cm
Weight: 2643.2 [oz_av]

## 2023-12-26 LAB — CBC
HCT: 40 % (ref 36.0–46.0)
Hemoglobin: 12.4 g/dL (ref 12.0–15.0)
MCH: 31.2 pg (ref 26.0–34.0)
MCHC: 31 g/dL (ref 30.0–36.0)
MCV: 100.8 fL — ABNORMAL HIGH (ref 80.0–100.0)
Platelets: 205 10*3/uL (ref 150–400)
RBC: 3.97 MIL/uL (ref 3.87–5.11)
RDW: 13.4 % (ref 11.5–15.5)
WBC: 3.9 10*3/uL — ABNORMAL LOW (ref 4.0–10.5)
nRBC: 0 % (ref 0.0–0.2)

## 2023-12-26 LAB — PROCALCITONIN: Procalcitonin: <0.1 ng/mL

## 2023-12-26 MED ORDER — PRAMIPEXOLE DIHYDROCHLORIDE 1 MG PO TABS
1.0000 mg | ORAL_TABLET | Freq: Every day | ORAL | Status: DC
  Administered 2023-12-26 – 2023-12-27 (×2): 1 mg via ORAL
  Filled 2023-12-26 (×2): qty 1

## 2023-12-26 MED ORDER — GUAIFENESIN ER 600 MG PO TB12
1200.0000 mg | ORAL_TABLET | Freq: Two times a day (BID) | ORAL | Status: DC
  Administered 2023-12-26 – 2023-12-28 (×4): 1200 mg via ORAL
  Filled 2023-12-26 (×4): qty 2

## 2023-12-26 MED ORDER — METHYLPREDNISOLONE SODIUM SUCC 40 MG IJ SOLR
40.0000 mg | Freq: Two times a day (BID) | INTRAMUSCULAR | Status: DC
  Administered 2023-12-26 – 2023-12-28 (×4): 40 mg via INTRAVENOUS
  Filled 2023-12-26 (×4): qty 1

## 2023-12-26 NOTE — Progress Notes (Signed)
 Ambulatory pulse oximetry was not done because patient's saturation already dropped to 81% on 3L of oxygen just by walking a few steps from bed towards the door. Patient was escorted back to bed with walker.

## 2023-12-26 NOTE — Progress Notes (Signed)
 PROGRESS NOTE  Lindsay Porter  DOB: [birth year]  PCP: Mathew Bouchard, DO FMW:[id number]  DOA: 12/25/2023  LOS: 0 days  Hospital Day: 2  Brief narrative: Lindsay Porter is a 83 y.o. female with PMH significant for COPD, remote smoking, GERD. 6/24, patient presented to the ED at [facility] with complaint of runny nose, cough, sore throat and shortness of breath, symptoms progressive for last 5 days.  Prior to that, patient reports for the last few weeks, she was feeling progressive dyspnea on exertion. Denies any cardiac history. At baseline, lives at home with her husband, does not use supplemental oxygen.  Does not use any assistive device. Follows up with [facility] pulmonology, uses inhalers.  Used to smoke in the past but not recently. Drinks 3 to 5 glasses of wine every day, last drink was few days prior to presentation.  Never had any history of alcohol withdrawal.  In the ED, patient was afebrile, hemodynamically stable, oxygen saturation was low at 75% required 2 L oxygen by nasal cannula. Labs showed WBC count 4.7, hemoglobin 11.5, Respiratory virus panel unremarkable Chest x-ray showed minimal bibasilar subsegmental atelectasis or scarring.   Patient was started on IV steroids, antibiotics bronchodilators Admitted to [facility]  Subjective: Patient was seen and examined this morning.  Pleasant elderly Caucasian female.  Propped up in bed.  On 2 L of nasal cannula.  Per RN at bedside, ambulation was attempted earlier this morning and her filtration dropped down to 80s on 3 L.  Not on supplemental oxygen at home. Patient voice sounds very congested. On auscultation, coughs violently on deep breathing.  No wheezing  Assessment and plan: Acute COPD exacerbation Acute hypoxic respiratory failure  Presented with progressive URI symptoms, sore throat, worsening cough and hypoxia Chest x-ray without acute infiltrates Patient probably has viral bronchitis or atypical bacterial  infection WBC count not elevated.  Will obtain procalcitonin level. Agree with IV Rocephin and azithromycin for now Given the severity of her symptoms, I would continue IV Solu-Medrol 40 mg twice daily Continue bronchodilators Reattempt ambulation again today Recent Labs  Lab 12/25/23 0940 12/25/23 1627 12/26/23 0436  WBC 4.7 4.3 3.9*   Dyspnea on exertion patient reports for the last few weeks, she was feeling progressive dyspnea on exertion. Denies any cardiac history. No JVD or pedal edema.  Obtain echo to know baseline LV and RV status  Chronic alcohol use 3 to 5 glasses of wine every day but last alcoholic drink was 3 to 4 days ago.  Denies any withdrawal symptoms in the past. Monitor for withdrawal symptoms.   Chronic anemia Continue iron, vitamin B12 supplement. Recent Labs    12/25/23 0940 12/25/23 1627 12/26/23 0436  HGB 11.5* 12.2 12.4  MCV 94.3 97.4 100.8*   GERD Continue PPI.  Depression Continue Zoloft .  Restless leg Requip    Mobility: Independent at baseline.  Encourage ambulation  Goals of care   Code Status: Full Code     DVT prophylaxis:  enoxaparin (LOVENOX) injection 40 mg Start: 12/25/23 2200   Antimicrobials: Rocephin, IV azithromycin Fluid: None Consultants: None Family Communication: None at bedside  Status: Observation Level of care:  Telemetry   Patient is from: Home Needs to continue in-hospital care: Continues to have significant respiratory distress, hypoxia Anticipated d/c to: Hopefully home in 1 to 2 days   Diet:  Diet Order             Diet Heart Room service appropriate? Yes; Fluid consistency: Thin  Diet effective  now                   Scheduled Meds:  azithromycin  500 mg Oral Daily   cyanocobalamin  1,000 mcg Oral Daily   enoxaparin (LOVENOX) injection  40 mg Subcutaneous Q24H   ferrous sulfate  325 mg Oral Q breakfast   fluticasone  furoate-vilanterol  1 puff Inhalation Daily   gabapentin   600 mg  Oral QHS   ipratropium-albuterol   3 mL Nebulization Q6H   methylPREDNISolone (SOLU-MEDROL) injection  40 mg Intravenous Q12H   pantoprazole   40 mg Oral BID   pramipexole  1 mg Oral QHS   sertraline   50 mg Oral QHS    PRN meds: acetaminophen  **OR** acetaminophen , albuterol , ondansetron  **OR** ondansetron  (ZOFRAN ) IV, traMADol    Infusions:    Antimicrobials: Anti-infectives (From admission, onward)    Start     Dose/Rate Route Frequency Ordered Stop   12/26/23 1600  azithromycin (ZITHROMAX) tablet 500 mg       Placed in Followed by Linked Group   500 mg Oral Daily 12/25/23 1455 12/28/23 0959   12/25/23 1600  azithromycin (ZITHROMAX) 500 mg in sodium chloride  0.9 % 250 mL IVPB       Placed in Followed by Linked Group   500 mg 250 mL/hr over 60 Minutes Intravenous Every 24 hours 12/25/23 1455 12/25/23 2058       Objective: Vitals:   12/26/23 0459 12/26/23 0855  BP: 103/79   Pulse: 85   Resp: 17   Temp: 98.4 F (36.9 C)   SpO2: 91% 93%    Intake/Output Summary (Last 24 hours) at 12/26/2023 1059 Last data filed at 12/25/2023 2300 Gross per 24 hour  Intake 710 ml  Output 500 ml  Net 210 ml   Filed Weights   12/25/23 1443  Weight: 74.9 kg   Weight change:  Body mass index is 32.26 kg/m.   Physical Exam: General exam: Pleasant, elderly Caucasian female Skin: No rashes, lesions or ulcers. HEENT: Atraumatic, normocephalic, no obvious bleeding Lungs: No wheezing but has diffuse bronchial sound and coughs moderately deep breathing CVS: S1, S2, no murmur,   GI/Abd: Soft, nontender, nondistended, bowel sound present,   CNS: Alert, awake, oriented x 3 Psychiatry: Mood appropriate,  Extremities: No pedal edema, no calf tenderness,   Data Review: I have personally reviewed the laboratory data and studies available.  F/u labs ordered Unresulted Labs (From admission, onward)     Start     Ordered   01/01/24 0500  Creatinine, serum  (enoxaparin (LOVENOX)    CrCl  >/= 30 ml/min)  Weekly,   R     Comments: while on enoxaparin therapy    12/25/23 1455   12/26/23 1052  Procalcitonin  Add-on,   AD       References:    Procalcitonin Lower Respiratory Tract Infection AND Sepsis Procalcitonin Algorithm   12/26/23 1051            Signed, Chapman Rota, MD [facility] 12/26/2023

## 2023-12-26 NOTE — Plan of Care (Signed)
 Pt has echo that needs to be completed. Anticipating dc possibly tomorrow   Problem: Education: Goal: Knowledge of General Education information will improve Description: Including pain rating scale, medication(s)/side effects and non-pharmacologic comfort measures Outcome: Progressing   Problem: Clinical Measurements: Goal: Will remain free from infection Outcome: Progressing

## 2023-12-26 NOTE — Progress Notes (Signed)
   12/26/23 0915  TOC Brief Assessment  Insurance and Status Reviewed  Patient has primary care physician Yes  Home environment has been reviewed single family home  Prior level of function: independent  Prior/Current Home Services No current home services  Social Drivers of Health Review SDOH reviewed no interventions necessary  Readmission risk has been reviewed Yes  Transition of care needs transition of care needs identified, TOC will continue to follow    Heather Saltness, MSW, LCSW 12/26/2023 9:15 AM

## 2023-12-27 DIAGNOSIS — J441 Chronic obstructive pulmonary disease with (acute) exacerbation: Secondary | ICD-10-CM | POA: Diagnosis not present

## 2023-12-27 MED ORDER — DOXYCYCLINE HYCLATE 100 MG PO TABS
100.0000 mg | ORAL_TABLET | Freq: Two times a day (BID) | ORAL | Status: DC
  Administered 2023-12-27 – 2023-12-28 (×2): 100 mg via ORAL
  Filled 2023-12-27 (×2): qty 1

## 2023-12-27 NOTE — Progress Notes (Signed)
 PROGRESS NOTE  Lindsay Porter  DOB: 1940-10-28  PCP: Mathew Bouchard, DO FMW:[id number]  DOA: 12/25/2023  LOS: 1 day  Hospital Day: 3  Brief narrative: Lindsay Porter is a 83 y.o. female with PMH significant for COPD, remote smoking, GERD. 6/24, patient presented to the ED at [facility] with complaint of runny nose, cough, sore throat and shortness of breath, symptoms progressive for last 5 days.  Prior to that, patient reports for the last few weeks, she was feeling progressive dyspnea on exertion. Denies any cardiac history. At baseline, lives at home with her husband, does not use supplemental oxygen.  Does not use any assistive device. Follows up with [facility] pulmonology, uses inhalers.  Used to smoke in the past but not recently. Drinks 3 to 5 glasses of wine every day, last drink was few days prior to presentation.  Never had any history of alcohol withdrawal.  In the ED, patient was afebrile, hemodynamically stable, oxygen saturation was low at 75% required 2 L oxygen by nasal cannula. Labs showed WBC count 4.7, hemoglobin 11.5, Respiratory virus panel unremarkable Chest x-ray showed minimal bibasilar subsegmental atelectasis or scarring.   Patient was started on IV steroids, antibiotics bronchodilators Admitted to [facility]  Subjective: Patient was seen and examined this morning.  Propped up in bed. Still sounds congested but better than yesterday. Was able to take a lap around the unit today but needed O2. Was not on O2 at home.  Still feels unable to expectorate phlegm. Husband at bedside today.  Assessment and plan: Acute COPD exacerbation Acute hypoxic respiratory failure  Presented with progressive URI symptoms, sore throat, worsening cough and hypoxia Chest x-ray without acute infiltrates Patient probably has viral bronchitis or atypical bacterial infection WBC count not elevated.  Procalcitonin level not elevated. Was given IV Rocephin and azithromycin in the  ED. I will start on 5 days of oral doxycycline for antiinflammatory effect.. For today, continue IV Solu-Medrol 40 mg twice daily Continue bronchodilators Reattempt ambulation again tomorrow. Wean down O2 as tolerated. Recent Labs  Lab 12/25/23 0940 12/25/23 1627 12/26/23 0436  WBC 4.7 4.3 3.9*  PROCALCITON  --   --  <0.10   Dyspnea on exertion patient reports for the last few weeks, she was feeling progressive dyspnea on exertion. Denies any cardiac history. No JVD or pedal edema. Echo unremarkable except for mild elevation in RVSP to 37 - likely because of COPD.  Chronic alcohol use Was using 3 to 5 glasses of wine every day but last alcoholic drink was 3 to 4 days ago.  Denies any withdrawal symptoms in the past. Monitor for withdrawal symptoms.   Chronic anemia Continue iron, vitamin B12 supplement. Recent Labs    12/25/23 0940 12/25/23 1627 12/26/23 0436  HGB 11.5* 12.2 12.4  MCV 94.3 97.4 100.8*   GERD Continue PPI.  Depression Continue Zoloft .  Restless leg Requip    Mobility: Independent at baseline.  Encourage ambulation  Goals of care   Code Status: Full Code     DVT prophylaxis:  enoxaparin (LOVENOX) injection 40 mg Start: 12/25/23 2200   Antimicrobials: oral doxycycline Fluid: None Consultants: None Family Communication: Husband at bedside  Status: Observation Level of care:  Telemetry   Patient is from: Home Needs to continue in-hospital care: Continues to have significant respiratory distress, hypoxia. Anticipated d/c to: Hopefully home in 1 to 2 days   Diet:  Diet Order             Diet Heart Room  service appropriate? Yes; Fluid consistency: Thin  Diet effective now                   Scheduled Meds:  cyanocobalamin  1,000 mcg Oral Daily   doxycycline  100 mg Oral Q12H   enoxaparin (LOVENOX) injection  40 mg Subcutaneous Q24H   ferrous sulfate  325 mg Oral Q breakfast   fluticasone  furoate-vilanterol  1 puff Inhalation  Daily   gabapentin   600 mg Oral QHS   guaiFENesin  1,200 mg Oral BID   ipratropium-albuterol   3 mL Nebulization Q6H   methylPREDNISolone (SOLU-MEDROL) injection  40 mg Intravenous Q12H   pantoprazole   40 mg Oral BID   pramipexole  1 mg Oral QHS   sertraline   50 mg Oral QHS    PRN meds: acetaminophen  **OR** acetaminophen , albuterol , ondansetron  **OR** ondansetron  (ZOFRAN ) IV, traMADol    Infusions:    Antimicrobials: Anti-infectives (From admission, onward)    Start     Dose/Rate Route Frequency Ordered Stop   12/27/23 2200  doxycycline (VIBRA-TABS) tablet 100 mg        100 mg Oral Every 12 hours 12/27/23 1359     12/26/23 1600  azithromycin (ZITHROMAX) tablet 500 mg       Placed in Followed by Linked Group   500 mg Oral Daily 12/25/23 1455 12/27/23 0937   12/25/23 1600  azithromycin (ZITHROMAX) 500 mg in sodium chloride  0.9 % 250 mL IVPB       Placed in Followed by Linked Group   500 mg 250 mL/hr over 60 Minutes Intravenous Every 24 hours 12/25/23 1455 12/25/23 2058       Objective: Vitals:   12/27/23 0816 12/27/23 1253  BP:  111/61  Pulse:  88  Resp:  18  Temp:  97.6 F (36.4 C)  SpO2: 95% 91%    Intake/Output Summary (Last 24 hours) at 12/27/2023 1404 Last data filed at 12/27/2023 0904 Gross per 24 hour  Intake 720 ml  Output --  Net 720 ml   Filed Weights   12/25/23 1443  Weight: 74.9 kg   Weight change:  Body mass index is 32.26 kg/m.   Physical Exam: General exam: Pleasant, elderly Caucasian female Skin: No rashes, lesions or ulcers. HEENT: Atraumatic, normocephalic, no obvious bleeding Lungs: improving b/l bronchial sound. Continues to mild cough on deep breathing CVS: S1, S2, no murmur,   GI/Abd: Soft, nontender, nondistended, bowel sound present,   CNS: Alert, awake, oriented x 3 Psychiatry: Mood appropriate. Extremities: No pedal edema, no calf tenderness,   Data Review: I have personally reviewed the laboratory data and studies  available.  F/u labs ordered Unresulted Labs (From admission, onward)     Start     Ordered   01/01/24 0500  Creatinine, serum  (enoxaparin (LOVENOX)    CrCl >/= 30 ml/min)  Weekly,   R     Comments: while on enoxaparin therapy    12/25/23 1455            Signed, Chapman Rota, MD [facility] 12/27/2023

## 2023-12-27 NOTE — Plan of Care (Signed)
  Patient Saturations on Room Air at Rest = 91%  Patient Saturations on Room Air while Ambulating = 88%  Patient Saturations on 2 Liters of oxygen while Ambulating = 93%

## 2023-12-27 NOTE — Plan of Care (Signed)
°  Problem: Clinical Measurements: °Goal: Ability to maintain clinical measurements within normal limits will improve °Outcome: Progressing °  °Problem: Clinical Measurements: °Goal: Diagnostic test results will improve °Outcome: Progressing °  °

## 2023-12-28 DIAGNOSIS — J441 Chronic obstructive pulmonary disease with (acute) exacerbation: Secondary | ICD-10-CM | POA: Diagnosis not present

## 2023-12-28 MED ORDER — TORSEMIDE 100 MG PO TABS: 100.0000 mg | ORAL_TABLET | Freq: Every day | ORAL | 0 refills | Status: DC | PRN

## 2023-12-28 MED ORDER — DOXYCYCLINE HYCLATE 100 MG PO TABS: 100.0000 mg | ORAL_TABLET | Freq: Two times a day (BID) | ORAL | 0 refills | Status: AC

## 2023-12-28 MED ORDER — PREDNISONE 10 MG PO TABS: ORAL_TABLET | ORAL | 0 refills | Status: DC

## 2023-12-28 MED ORDER — GUAIFENESIN ER 600 MG PO TB12: 1200.0000 mg | ORAL_TABLET | Freq: Two times a day (BID) | ORAL | 0 refills | Status: AC

## 2023-12-28 MED ORDER — SALINE SPRAY 0.65 % NA SOLN
1.0000 | NASAL | Status: DC | PRN
  Filled 2023-12-28: qty 44

## 2023-12-28 MED ORDER — IPRATROPIUM-ALBUTEROL 0.5-2.5 (3) MG/3ML IN SOLN
3.0000 mL | Freq: Three times a day (TID) | RESPIRATORY_TRACT | Status: DC
  Administered 2023-12-28 (×2): 3 mL via RESPIRATORY_TRACT
  Filled 2023-12-28 (×2): qty 3

## 2023-12-28 NOTE — Discharge Summary (Signed)
 Physician Discharge Summary  Lindsay Porter FMW:[id number] DOB: 12/19/1940 DOA: 12/25/2023  PCP: Mathew Bouchard, DO  Admit date: 12/25/2023 Discharge date: 12/28/2023  Admitted From: home Discharge disposition: home  Recommendations at discharge:  Complete the course of antibiotics with 3 more days of oral doxycycline  Complete the tapering course of prednisone  Take torsemide 20 mg daily as needed for bilateral leg swelling or shortness of breath.   Brief narrative: Lindsay Porter is a 83 y.o. female with PMH significant for COPD, remote smoking, GERD. 6/24, patient presented to the ED at [facility] with complaint of runny nose, cough, sore throat and shortness of breath, symptoms progressive for last 5 days.  Prior to that, patient reports for the last few weeks, she was feeling progressive dyspnea on exertion. Denies any cardiac history. At baseline, lives at home with her husband, does not use supplemental oxygen.  Does not use any assistive device. Follows up with [facility] pulmonology, uses inhalers.  Used to smoke in the past but not recently. Drinks 3 to 5 glasses of wine every day, last drink was few days prior to presentation.  Never had any history of alcohol withdrawal.  In the ED, patient was afebrile, hemodynamically stable, oxygen saturation was low at 75% required 2 L oxygen by nasal cannula. Labs showed WBC count 4.7, hemoglobin 11.5, Respiratory virus panel unremarkable Chest x-ray showed minimal bibasilar subsegmental atelectasis or scarring.   Patient was started on IV steroids, antibiotics bronchodilators Admitted to [facility]  Subjective: Patient was seen and examined this morning.  Propped up in bed.  Not in distress.  Feels better.  Not on supplemental oxygen.  Was able to ambulate today without supplemental oxygen and maintain saturation over 90%. Feels ready to go home today.  Hospital course: Acute COPD exacerbation Acute hypoxic respiratory failure   Presented with progressive URI symptoms, sore throat, worsening cough and hypoxia Chest x-ray without acute infiltrates Patient probably had viral bronchitis or atypical bacterial infection WBC count was not elevated.  Procalcitonin level was not elevated. Was initially started on IV Rocephin and azithromycin  and later switched to doxycycline .  Currently on IV Solu-Medrol  40 mg twice daily Feels better and ready to go home. To continue doxycycline  for 3 more days at home to complete 5-day course. Switch to oral prednisone  and taper to stop. Initially required supplemental oxygen.  Able to ambulate on the hallway without oxygen today. Recent Labs  Lab 12/25/23 0940 12/25/23 1627 12/26/23 0436  WBC 4.7 4.3 3.9*  PROCALCITON  --   --  <0.10   Dyspnea on exertion Mild pulmonary hypertension patient reports for the last few weeks, she was feeling progressive dyspnea on exertion. Denies any cardiac history. No JVD or pedal edema. Echo unremarkable except for mild elevation in RVSP to 37 - likely because of COPD. Started on torsemide 20 mg daily as needed to be taken if develops shortness of breath or pedal edema.  Chronic alcohol use Was using 3 to 5 glasses of wine every day but last alcoholic drink was 3 to 4 days ago.  Did not have any withdrawal symptoms in the hospital.  Chronic anemia Continue iron, vitamin B12 supplement. Recent Labs    12/25/23 0940 12/25/23 1627 12/26/23 0436  HGB 11.5* 12.2 12.4  MCV 94.3 97.4 100.8*   GERD Continue PPI.  Depression Continue Zoloft .  Restless leg Requip    Mobility: Independent at baseline.  Encourage ambulation  Goals of care   Code Status: Full Code   Diet:  Diet Order             Diet general           Diet Heart Room service appropriate? Yes; Fluid consistency: Thin  Diet effective now                   Nutritional status:  Body mass index is 32.26 kg/m.       Wounds:  -    Discharge Exam:    Vitals:   12/27/23 1253 12/27/23 2107 12/28/23 0434 12/28/23 0844  BP: 111/61 125/73 139/87   Pulse: 88 76 74   Resp: 18 17 17    Temp: 97.6 F (36.4 C) 98.7 F (37.1 C) 98.3 F (36.8 C)   TempSrc: Oral Oral Oral   SpO2: 91% 92% 93% 93%  Weight:      Height:        Body mass index is 32.26 kg/m.  General exam: Pleasant, elderly Caucasian female Skin: No rashes, lesions or ulcers. HEENT: Atraumatic, normocephalic, no obvious bleeding Lungs: Clear to auscultation bilaterally, no wheezing or crackles but still has some cough on deep breathing CVS: S1, S2, no murmur,   GI/Abd: Soft, nontender, nondistended, bowel sound present,   CNS: Alert, awake, oriented x 3 Psychiatry: Mood appropriate,  Extremities: No pedal edema, no calf tenderness,   Follow ups:    Follow-up Information     Watson, Megan, DO Follow up.   Specialty: Family Medicine Contact information: [address] [place] [address] [phone]                 Discharge Instructions:   Discharge Instructions     Call MD for:  difficulty breathing, headache or visual disturbances   Complete by: As directed    Call MD for:  extreme fatigue   Complete by: As directed    Call MD for:  hives   Complete by: As directed    Call MD for:  persistant dizziness or light-headedness   Complete by: As directed    Call MD for:  persistant nausea and vomiting   Complete by: As directed    Call MD for:  severe uncontrolled pain   Complete by: As directed    Call MD for:  temperature >100.4   Complete by: As directed    Diet general   Complete by: As directed    Discharge instructions   Complete by: As directed    Recommendations at discharge:   Complete the course of antibiotics with 3 more days of oral doxycycline   Complete the tapering course of prednisone   Take torsemide 20 mg daily as needed for bilateral leg swelling or shortness of breath.  General discharge  instructions: Follow with Primary MD Mathew Bouchard, DO in 7 days  Please request your PCP  to go over your hospital tests, procedures, radiology results at the follow up. Please get your medicines reviewed and adjusted.  Your PCP may decide to repeat certain labs or tests as needed. Do not drive, operate heavy machinery, perform activities at heights, swimming or participation in water activities or provide baby sitting services if your were admitted for syncope or siezures until you have seen by Primary MD or a Neurologist and advised to do so again. [place]  Controlled Substance Reporting System database was reviewed. Do not drive, operate heavy machinery, perform activities at heights, swim, participate in water activities or provide baby-sitting services while on medications for pain, sleep and mood until  your outpatient physician has reevaluated you and advised to do so again.  You are strongly recommended to comply with the dose, frequency and duration of prescribed medications. Activity: As tolerated with Full fall precautions use walker/cane & assistance as needed Avoid using any recreational substances like cigarette, tobacco, alcohol, or non-prescribed drug. If you experience worsening of your admission symptoms, develop shortness of breath, life threatening emergency, suicidal or homicidal thoughts you must seek medical attention immediately by calling 911 or calling your MD immediately  if symptoms less severe. You must read complete instructions/literature along with all the possible adverse reactions/side effects for all the medicines you take and that have been prescribed to you. Take any new medicine only after you have completely understood and accepted all the possible adverse reactions/side effects.  Wear Seat belts while driving. You were cared for by a hospitalist during your hospital stay. If you have any questions about your discharge medications or the care you received while  you were in the hospital after you are discharged, you can call the unit and ask to speak with the hospitalist or the covering physician. Once you are discharged, your primary care physician will handle any further medical issues. Please note that NO REFILLS for any discharge medications will be authorized once you are discharged, as it is imperative that you return to your primary care physician (or establish a relationship with a primary care physician if you do not have one).   Increase activity slowly   Complete by: As directed        Discharge Medications:   Allergies as of 12/28/2023       Reactions   Singulair [montelukast Sodium] Hives   Sulfa Antibiotics Other (See Comments)   Childhood reaction         Medication List     TAKE these medications    albuterol  108 (90 Base) MCG/ACT inhaler Commonly known as: VENTOLIN  HFA INHALE 2 PUFFS INTO THE LUNGS EVERY 6 HOURS AS NEEDED FOR WHEEZING OR SHORTNESS OF BREATH What changed: when to take this   budesonide -formoterol  160-4.5 MCG/ACT inhaler Commonly known as: SYMBICORT  INHALE 2 PUFFS INTO THE LUNGS TWICE DAILY   cyanocobalamin  1000 MCG tablet Commonly known as: VITAMIN B12 Take 1,000 mcg by mouth daily.   doxycycline  100 MG tablet Commonly known as: VIBRA -TABS Take 1 tablet (100 mg total) by mouth every 12 (twelve) hours for 3 days.   ferrous sulfate  325 (65 FE) MG tablet Take 325 mg by mouth daily with breakfast.   fluticasone  50 MCG/ACT nasal spray Commonly known as: FLONASE  SHAKE LIQUID AND USE 2 SPRAYS IN EACH NOSTRIL DAILY What changed: See the new instructions.   gabapentin  300 MG capsule Commonly known as: NEURONTIN  Take 600 mg by mouth at bedtime.   guaiFENesin  600 MG 12 hr tablet Commonly known as: MUCINEX  Take 2 tablets (1,200 mg total) by mouth 2 (two) times daily for 7 days.   loratadine 10 MG tablet Commonly known as: CLARITIN Take 20 mg by mouth every evening.   meloxicam  15 MG  tablet Commonly known as: MOBIC  Take 15 mg by mouth daily.   Multivitamin Women Tabs Take 1 tablet by mouth daily with breakfast.   pantoprazole  40 MG tablet Commonly known as: PROTONIX  Take 1 tablet (40 mg total) by mouth 2 (two) times daily. What changed:  when to take this reasons to take this   pramipexole  1 MG tablet Commonly known as: MIRAPEX  Take 1 mg by mouth at bedtime.  predniSONE  10 MG tablet Commonly known as: DELTASONE  4 tabs twice daily X 3 days -->4 tabs daily X 3 days-->3 tabs daily X 3 days--> 2 tabs daily X 3 days--> 1 tab daily X 3 days, then STOP.   sertraline  50 MG tablet Commonly known as: ZOLOFT  Take 50 mg by mouth at bedtime.   torsemide 100 MG tablet Commonly known as: DEMADEX Take 1 tablet (100 mg total) by mouth daily as needed.   traMADol  50 MG tablet Commonly known as: ULTRAM  Take 1 tablet (50 mg total) by mouth every 6 (six) hours as needed for moderate pain or severe pain.         The results of significant diagnostics from this hospitalization (including imaging, microbiology, ancillary and laboratory) are listed below for reference.    Procedures and Diagnostic Studies:   ECHOCARDIOGRAM COMPLETE Result Date: 12/26/2023    ECHOCARDIOGRAM REPORT   Patient Name:   AUDIANNA LANDGREN [facility] Date of Exam: 12/26/2023 Medical Rec #:  [id number]            Height:       60.0 in Accession #:    [id number]           Weight:       165.2 lb Date of Birth:  1940/10/09            BSA:          1.721 m Patient Age:    83 years             BP:           113/67 mmHg Patient Gender: F                    HR:           71 bpm. Exam Location:  Inpatient Procedure: 2D Echo, 3D Echo, Cardiac Doppler, Color Doppler and Strain Analysis            (Both Spectral and Color Flow Doppler were utilized during            procedure). Indications:     Abnormal EKG  History:         Patient has no prior history of Echocardiogram examinations.                  Abnormal ECG.   Sonographer:     Therisa Crouch Referring Phys:  [id number] [facility] Nickalous Stingley Diagnosing Phys: Ria Commander IMPRESSIONS  1. Left ventricular ejection fraction, by estimation, is 60 to 65%. The left ventricle has normal function. The left ventricle has no regional wall motion abnormalities. Left ventricular diastolic parameters are indeterminate. The average left ventricular global longitudinal strain is -20.9 %. The global longitudinal strain is normal.  2. Right ventricular systolic function is normal. The right ventricular size is normal. There is mildly elevated pulmonary artery systolic pressure. The estimated right ventricular systolic pressure is 36.9 mmHg.  3. Left atrial size was mild to moderately dilated.  4. The mitral valve is normal in structure. No evidence of mitral valve regurgitation. No evidence of mitral stenosis.  5. The aortic valve is normal in structure. Aortic valve regurgitation is trivial. No aortic stenosis is present.  6. The inferior vena cava is dilated in size with >50% respiratory variability, suggesting right atrial pressure of 8 mmHg. FINDINGS  Left Ventricle: Left ventricular ejection fraction, by estimation, is 60 to 65%. The left ventricle has normal function. The left ventricle has no regional  wall motion abnormalities. The average left ventricular global longitudinal strain is -20.9 %. Strain was performed and the global longitudinal strain is normal. The left ventricular internal cavity size was normal in size. There is no left ventricular hypertrophy. Left ventricular diastolic parameters are indeterminate. Right Ventricle: The right ventricular size is normal. No increase in right ventricular wall thickness. Right ventricular systolic function is normal. There is mildly elevated pulmonary artery systolic pressure. The tricuspid regurgitant velocity is 2.69  m/s, and with an assumed right atrial pressure of 8 mmHg, the estimated right ventricular systolic pressure is 36.9 mmHg.  Left Atrium: Left atrial size was mild to moderately dilated. Right Atrium: Right atrial size was normal in size. Pericardium: There is no evidence of pericardial effusion. Mitral Valve: The mitral valve is normal in structure. No evidence of mitral valve regurgitation. No evidence of mitral valve stenosis. Tricuspid Valve: The tricuspid valve is normal in structure. Tricuspid valve regurgitation is not demonstrated. No evidence of tricuspid stenosis. Aortic Valve: The aortic valve is normal in structure. Aortic valve regurgitation is trivial. No aortic stenosis is present. Pulmonic Valve: The pulmonic valve was normal in structure. Pulmonic valve regurgitation is not visualized. No evidence of pulmonic stenosis. Aorta: The aortic root is normal in size and structure. Venous: The inferior vena cava is dilated in size with greater than 50% respiratory variability, suggesting right atrial pressure of 8 mmHg. IAS/Shunts: No atrial level shunt detected by color flow Doppler.  LEFT VENTRICLE PLAX 2D LVIDd:         4.50 cm   Diastology LVIDs:         3.00 cm   LV e' medial:    7.40 cm/s LV PW:         1.20 cm   LV E/e' medial:  11.8 LV IVS:        1.00 cm   LV e' lateral:   11.50 cm/s LVOT diam:     2.00 cm   LV E/e' lateral: 7.6 LVOT Area:     3.14 cm                          2D Longitudinal Strain                          2D Strain GLS Avg:     -20.9 %                           3D Volume EF                          LV 3D EDV:   59.44 ml                          LV 3D ESV:   25.39 ml RIGHT VENTRICLE             IVC RV Basal diam:  3.80 cm     IVC diam: 2.60 cm RV S prime:     12.50 cm/s TAPSE (M-mode): 2.6 cm LEFT ATRIUM             Index        RIGHT ATRIUM           Index LA diam:        3.10 cm 1.80 cm/m  RA Area:     18.50 cm LA Vol (A2C):   77.6 ml 45.09 ml/m  RA Volume:   48.70 ml  28.30 ml/m LA Vol (A4C):   57.5 ml 33.41 ml/m LA Biplane Vol: 68.6 ml 39.86 ml/m   AORTA Ao Root diam: 3.00 cm Ao Asc diam:   3.60 cm MITRAL VALVE                TRICUSPID VALVE MV Area (PHT): 4.89 cm     TR Peak grad:   28.9 mmHg MV E velocity: 87.50 cm/s   TR Vmax:        269.00 cm/s MV A velocity: 113.00 cm/s MV E/A ratio:  0.77         SHUNTS                             Systemic Diam: 2.00 cm Aditya Sabharwal Electronically signed by Ria Commander Signature Date/Time: 12/26/2023/5:23:17 PM    Final (Updated)    DG Chest Port 1 View Result Date: 12/25/2023 CLINICAL DATA:  Shortness of breath. EXAM: PORTABLE CHEST 1 VIEW COMPARISON:  August 13, 2023. FINDINGS: Stable cardiomediastinal silhouette. Minimal bibasilar subsegmental atelectasis or scarring is noted. Degenerative changes are seen involving both glenohumeral joints. IMPRESSION: Minimal bibasilar subsegmental atelectasis or scarring. Electronically Signed   By: Lynwood Landy Raddle M.D.   On: 12/25/2023 10:33     Labs:   Basic Metabolic Panel: Recent Labs  Lab 12/25/23 0940 12/25/23 1627 12/26/23 0436  NA 135  --  134*  K 4.0  --  4.2  CL 96*  --  99  CO2 28  --  25  GLUCOSE 139*  --  158*  BUN 10  --  10  CREATININE 0.53 0.58 0.47  CALCIUM 9.2  --  8.6*   GFR Estimated Creatinine Clearance: 49.9 mL/min (by C-G formula based on SCr of 0.47 mg/dL). Liver Function Tests: No results for input(s): AST, ALT, ALKPHOS, BILITOT, PROT, ALBUMIN in the last 168 hours. No results for input(s): LIPASE, AMYLASE in the last 168 hours. No results for input(s): AMMONIA in the last 168 hours. Coagulation profile No results for input(s): INR, PROTIME in the last 168 hours.  CBC: Recent Labs  Lab 12/25/23 0940 12/25/23 1627 12/26/23 0436  WBC 4.7 4.3 3.9*  NEUTROABS 3.4  --   --   HGB 11.5* 12.2 12.4  HCT 34.7* 37.9 40.0  MCV 94.3 97.4 100.8*  PLT 187 178 205   Cardiac Enzymes: No results for input(s): CKTOTAL, CKMB, CKMBINDEX, TROPONINI in the last 168 hours. BNP: Invalid input(s): POCBNP CBG: No results for  input(s): GLUCAP in the last 168 hours. D-Dimer No results for input(s): DDIMER in the last 72 hours. Hgb A1c No results for input(s): HGBA1C in the last 72 hours. Lipid Profile No results for input(s): CHOL, HDL, LDLCALC, TRIG, CHOLHDL, LDLDIRECT in the last 72 hours. Thyroid  function studies No results for input(s): TSH, T4TOTAL, T3FREE, THYROIDAB in the last 72 hours.  Invalid input(s): FREET3 Anemia work up No results for input(s): VITAMINB12, FOLATE, FERRITIN, TIBC, IRON, RETICCTPCT in the last 72 hours. Microbiology Recent Results (from the past 240 hours)  SARS Coronavirus 2 by RT PCR (hospital order, performed in [facility] hospital lab) *cepheid single result test* Anterior Nasal Swab     Status: None   Collection Time: 12/25/23  9:40 AM   Specimen: Anterior Nasal Swab  Result Value  Ref Range Status   SARS Coronavirus 2 by RT PCR NEGATIVE NEGATIVE Final    Comment: (NOTE) SARS-CoV-2 target nucleic acids are NOT DETECTED.  The SARS-CoV-2 RNA is generally detectable in upper and lower respiratory specimens during the acute phase of infection. The lowest concentration of SARS-CoV-2 viral copies this assay can detect is 250 copies / mL. A negative result does not preclude SARS-CoV-2 infection and should not be used as the sole basis for treatment or other patient management decisions.  A negative result may occur with improper specimen collection / handling, submission of specimen other than nasopharyngeal swab, presence of viral mutation(s) within the areas targeted by this assay, and inadequate number of viral copies (<250 copies / mL). A negative result must be combined with clinical observations, patient history, and epidemiological information.  Fact Sheet for Patients:   RoadLapTop.co.za  Fact Sheet for Healthcare Providers: http://kim-miller.com/  This test is not yet approved or   cleared by the [place]  FDA and has been authorized for detection and/or diagnosis of SARS-CoV-2 by FDA under an Emergency Use Authorization (EUA).  This EUA will remain in effect (meaning this test can be used) for the duration of the COVID-19 declaration under Section 564(b)(1) of the Act, 21 U.S.C. section 360bbb-3(b)(1), unless the authorization is terminated or revoked sooner.  Performed at [facility], [address], [place], [place] [address]   Resp panel by RT-PCR (RSV, Flu A&B, Covid) Anterior Nasal Swab     Status: None   Collection Time: 12/25/23  1:46 PM   Specimen: Anterior Nasal Swab  Result Value Ref Range Status   SARS Coronavirus 2 by RT PCR NEGATIVE NEGATIVE Final    Comment: (NOTE) SARS-CoV-2 target nucleic acids are NOT DETECTED.  The SARS-CoV-2 RNA is generally detectable in upper respiratory specimens during the acute phase of infection. The lowest concentration of SARS-CoV-2 viral copies this assay can detect is 138 copies/mL. A negative result does not preclude SARS-Cov-2 infection and should not be used as the sole basis for treatment or other patient management decisions. A negative result may occur with  improper specimen collection/handling, submission of specimen other than nasopharyngeal swab, presence of viral mutation(s) within the areas targeted by this assay, and inadequate number of viral copies(<138 copies/mL). A negative result must be combined with clinical observations, patient history, and epidemiological information. The expected result is Negative.  Fact Sheet for Patients:  BloggerCourse.com  Fact Sheet for Healthcare Providers:  SeriousBroker.it  This test is no t yet approved or cleared by the [place]  FDA and  has been authorized for detection and/or diagnosis of SARS-CoV-2 by FDA under an Emergency Use Authorization (EUA). This EUA will remain  in  effect (meaning this test can be used) for the duration of the COVID-19 declaration under Section 564(b)(1) of the Act, 21 U.S.C.section 360bbb-3(b)(1), unless the authorization is terminated  or revoked sooner.       Influenza A by PCR NEGATIVE NEGATIVE Final   Influenza B by PCR NEGATIVE NEGATIVE Final    Comment: (NOTE) The Xpert Xpress SARS-CoV-2/FLU/RSV plus assay is intended as an aid in the diagnosis of influenza from Nasopharyngeal swab specimens and should not be used as a sole basis for treatment. Nasal washings and aspirates are unacceptable for Xpert Xpress SARS-CoV-2/FLU/RSV testing.  Fact Sheet for Patients: BloggerCourse.com  Fact Sheet for Healthcare Providers: SeriousBroker.it  This test is not yet approved or cleared by the [place]  FDA and has been authorized for detection  and/or diagnosis of SARS-CoV-2 by FDA under an Emergency Use Authorization (EUA). This EUA will remain in effect (meaning this test can be used) for the duration of the COVID-19 declaration under Section 564(b)(1) of the Act, 21 U.S.C. section 360bbb-3(b)(1), unless the authorization is terminated or revoked.     Resp Syncytial Virus by PCR NEGATIVE NEGATIVE Final    Comment: (NOTE) Fact Sheet for Patients: BloggerCourse.com  Fact Sheet for Healthcare Providers: SeriousBroker.it  This test is not yet approved or cleared by the [place]  FDA and has been authorized for detection and/or diagnosis of SARS-CoV-2 by FDA under an Emergency Use Authorization (EUA). This EUA will remain in effect (meaning this test can be used) for the duration of the COVID-19 declaration under Section 564(b)(1) of the Act, 21 U.S.C. section 360bbb-3(b)(1), unless the authorization is terminated or revoked.  Performed at [facility], [address] [place], [place] [address]    Respiratory (~20 pathogens) panel by PCR     Status: None   Collection Time: 12/25/23  6:17 PM   Specimen: Nasopharyngeal Swab; Respiratory  Result Value Ref Range Status   Adenovirus NOT DETECTED NOT DETECTED Final   Coronavirus 229E NOT DETECTED NOT DETECTED Final    Comment: (NOTE) The Coronavirus on the Respiratory Panel, DOES NOT test for the novel  Coronavirus (2019 nCoV)    Coronavirus HKU1 NOT DETECTED NOT DETECTED Final   Coronavirus NL63 NOT DETECTED NOT DETECTED Final   Coronavirus OC43 NOT DETECTED NOT DETECTED Final   Metapneumovirus NOT DETECTED NOT DETECTED Final   Rhinovirus / Enterovirus NOT DETECTED NOT DETECTED Final   Influenza A NOT DETECTED NOT DETECTED Final   Influenza B NOT DETECTED NOT DETECTED Final   Parainfluenza Virus 1 NOT DETECTED NOT DETECTED Final   Parainfluenza Virus 2 NOT DETECTED NOT DETECTED Final   Parainfluenza Virus 3 NOT DETECTED NOT DETECTED Final   Parainfluenza Virus 4 NOT DETECTED NOT DETECTED Final   Respiratory Syncytial Virus NOT DETECTED NOT DETECTED Final   Bordetella pertussis NOT DETECTED NOT DETECTED Final   Bordetella Parapertussis NOT DETECTED NOT DETECTED Final   Chlamydophila pneumoniae NOT DETECTED NOT DETECTED Final   Mycoplasma pneumoniae NOT DETECTED NOT DETECTED Final    Comment: Performed at [facility], [address] [place], [place] [address]    Time coordinating discharge: 45 minutes  Signed: Nikkita Adeyemi  [facility] 12/28/2023, 12:53 PM

## 2023-12-28 NOTE — Plan of Care (Signed)
 SATURATION QUALIFICATIONS: (This note is used to comply with regulatory documentation for home oxygen)  Patient Saturations on Room Air at Rest = 91%  Patient Saturations on Room Air while Ambulating = 91%   Do not talk while walking, just breathe!

## 2024-01-15 ENCOUNTER — Other Ambulatory Visit: Payer: Self-pay | Admitting: Pulmonary Disease

## 2024-01-18 ENCOUNTER — Ambulatory Visit: Payer: Self-pay | Admitting: Pulmonary Disease

## 2024-01-18 NOTE — Telephone Encounter (Addendum)
 FYI Only or Action Required?: Action required by provider: request for appointment and FYI for provider for ED . Patient is needing a call back on Monday to make an appointment to be seen in office.   Patient is followed in Pulmonology for COPD, last seen on 09/19/2022 by Kara Dorn NOVAK, MD.  Called Nurse Triage reporting Shortness of Breath.  Symptoms began 4 days ago.  Interventions attempted: Rescue inhaler and Maintenance inhaler.  Symptoms are: unchanged.  Triage Disposition: Go to ED Now (or PCP Triage)  Patient/caregiver understands and will follow disposition?: Yes  Copied from CRM [id number]. Topic: Clinical - Red Word Triage >> Jan 18, 2024  2:24 PM Celestine FALCON wrote: Red Word that prompted transfer to Nurse Triage: Pt would like to schedule hospital follow up with Dr. Kara. Pt was in the hospital on 6/24 dc 6/27 for COPD. Pt stated she has had SOB, difficulty breathing for the last 3 or 4 days. Pt stated even walking to and from the kitchen makes her have SOB. No appt has been made. Reason for Disposition  Patient sounds very sick or weak to the triager  Answer Assessment - Initial Assessment Questions 1. RESPIRATORY STATUS: Describe your breathing? (e.g., wheezing, shortness of breath, unable to speak, severe coughing)      Shortness of breath-especially with activity 2. ONSET: When did this breathing problem begin?      Three-four days 3. PATTERN Does the difficult breathing come and go, or has it been constant since it started?      constant 4. SEVERITY: How bad is your breathing? (e.g., mild, moderate, severe)      Moderate to severe shortness of breath 5. RECURRENT SYMPTOM: Have you had difficulty breathing before? If Yes, ask: When was the last time? and What happened that time?      Yes-prior to admission in June 6. CARDIAC HISTORY: Do you have any history of heart disease? (e.g., heart attack, angina, bypass surgery, angioplasty)      no 7. LUNG  HISTORY: Do you have any history of lung disease?  (e.g., pulmonary embolus, asthma, emphysema)     COPD 8. CAUSE: What do you think is causing the breathing problem?      unsure 9. OTHER SYMPTOMS: Do you have any other symptoms? (e.g., chest pain, cough, dizziness, fever, runny nose)     cough 10. O2 SATURATION MONITOR:  Do you use an oxygen saturation monitor (pulse oximeter) at home? If Yes, ask: What is your reading (oxygen level) today? What is your usual oxygen saturation reading? (e.g., 95%)       92-94% 12. TRAVEL: Have you traveled out of the country in the last month? (e.g., travel history, exposures)       No  Patient calling to make a follow up appointment for her hospitalization from June 24-June 27. Patient reports symptoms of increased shortness of breath for the last three-four days. Patient recommended to the Emergency Department.  Protocols used: Breathing Difficulty-A-AH

## 2024-01-21 NOTE — Telephone Encounter (Signed)
 Pt wishes to schedule HFU

## 2024-01-22 ENCOUNTER — Telehealth (HOSPITAL_BASED_OUTPATIENT_CLINIC_OR_DEPARTMENT_OTHER): Payer: Self-pay

## 2024-01-22 ENCOUNTER — Ambulatory Visit

## 2024-01-22 VITALS — BP 118/69 | HR 73 | Ht 66.0 in | Wt 174.6 lb

## 2024-01-22 DIAGNOSIS — Z87891 Personal history of nicotine dependence: Secondary | ICD-10-CM

## 2024-01-22 DIAGNOSIS — I2721 Secondary pulmonary arterial hypertension: Secondary | ICD-10-CM | POA: Diagnosis not present

## 2024-01-22 DIAGNOSIS — J449 Chronic obstructive pulmonary disease, unspecified: Secondary | ICD-10-CM

## 2024-01-22 DIAGNOSIS — F1029 Alcohol dependence with unspecified alcohol-induced disorder: Secondary | ICD-10-CM

## 2024-01-22 MED ORDER — TRELEGY ELLIPTA 100-62.5-25 MCG/ACT IN AEPB: 1.0000 | INHALATION_SPRAY | Freq: Every day | RESPIRATORY_TRACT | 5 refills | Status: AC

## 2024-01-22 MED ORDER — AZITHROMYCIN 250 MG PO TABS: 250.0000 mg | ORAL_TABLET | Freq: Every day | ORAL | 0 refills | Status: DC

## 2024-01-22 NOTE — Assessment & Plan Note (Signed)
-   Patient reports she has not had a drink since her discharge from hospital. -  Encouraged her to continue vitamin B12 and multivitamin supplementation

## 2024-01-22 NOTE — Assessment & Plan Note (Signed)
-   Previously well-controlled on Symbicort  but is now having issues with dyspnea on exertion and hospital admissions -  Chart reviewed and demonstrates elevated RVSP; likely will need workup for pulmonary hypertension that may be a contributing factor - Trial of Trelegy 100 -  Finish full course of zithromax  for atypicals -  Plan for follow-up in 6 weeks

## 2024-01-22 NOTE — Patient Instructions (Signed)
 Stop Symbicort  and Start Trelegy one puff inhaled daily.  Continue Albuterol  every 4-6 hours as needed for trouble breathing.    Check daily weights; if 3-5 lb weight gain take Torsemide  1/2 tab  Low dose CT for lung cancer screening ordered.  Follow up in 6 weeks.

## 2024-01-22 NOTE — Telephone Encounter (Signed)
 Copied from CRM [id number]. Topic: Clinical - Prescription Issue >> Jan 22, 2024  2:08 PM Whitney O wrote: Reason for CRM: patient is calling about her medication that the doctor was sending in . Patient checked with pharmacy and they dont have the Fluticasone -Umeclidin-Vilant (TRELEGY ELLIPTA ) 100-62.5-25 MCG/ACT AEPB. I did let patient know it shows confirmed today this morning . Patient say she will call pharmacy again but please check with pharmacy about medication

## 2024-01-22 NOTE — Progress Notes (Signed)
 @Patient  ID: Lindsay Porter, female    DOB: February 26, 1941, 83 y.o.   MRN: [id number]  Chief Complaint  Patient presents with   Hospitalization Follow-up    Breathing is not the best  12/25/2023-12/28/2023    Referring provider: Mathew Bouchard, DO  HPI: Lindsay Porter is an 83 y/o female with PMH of COPD and asthma who presents today for a hospital follow up visit.  She reports that she was seen at urgent care on 12/25/2023 and transferred to the emergency department for shortness of breath.  She is diagnosed with COPD exacerbation secondary to atypical bronchitis and had a 3-day hospital stay.  During her hospitalization she was treated with IV ceftriaxone, azithromycin , and Solu-Medrol .  She was transitioned to oral prednisone  and doxycycline  at discharge and completed 3 more days of treatment.  Notably her imaging was reviewed during that admission and she was found to have an elevated RVSP at 37 on prior imaging.  She was given torsemide  daily for pulmonary artery hypertension.  She also reports that she has a history of alcohol abuse.  She drinks wine daily but states that she has stopped since this admission.  She stopped taking her torsemide  because she felt like it was causing her to be less mentally clear but this is also in the setting of taking prednisone  and stopping alcohol.  She feels that she is more mentally clear, but is now off of the prednisone  and torsemide .  She contacted our office yesterday with complaints of shortness of breath and presents today for follow-up.  Today she reports that she feels some shortness of breath with exertion and that she has felt for quite some time that her Symbicort  has not been helping her.  She denies fever, chills, chest pain, headache, dizziness, or wheezing.   TEST/EVENTS :   Allergies  Allergen Reactions   Singulair [Montelukast Sodium] Hives   Sulfa Antibiotics Other (See Comments)    Childhood reaction     Immunization History  Administered  Date(s) Administered   Influenza Split 05/03/2012, 06/02/2012, 05/03/2013, 03/03/2015   Influenza, High Dose Seasonal PF 04/26/2019   Influenza, Seasonal, Injecte, Preservative Fre 04/23/2014   Influenza,inj,Quad PF,6+ Mos 03/22/2017   Influenza-Unspecified 04/23/2014   Pneumococcal Conjugate-13 04/27/2014   Pneumococcal Polysaccharide-23 06/02/2012   Unspecified SARS-COV-2 Vaccination 07/24/2019, 08/14/2019   Zoster, Live 01/26/2015    Past Medical History:  Diagnosis Date   Alcohol abuse    Allergy    Anemia, iron deficiency    Arthritis    Carpal tunnel syndrome    COPD (chronic obstructive pulmonary disease) (HCC)    Depression    Diverticulosis    Dyspnea    GERD (gastroesophageal reflux disease)    Hiatal hernia    Hyperlipidemia    Insomnia    Osteopenia    Ovarian cyst    Pneumonia 07/2022   Pre-diabetes    Restless leg syndrome    Seasonal allergies    Vitamin D deficiency    Wears contact lenses     Tobacco History: Social History   Tobacco Use  Smoking Status Former   Current packs/day: 0.00   Average packs/day: 2.0 packs/day for 45.0 years (90.0 ttl pk-yrs)   Types: Cigarettes   Start date: 10/02/1962   Quit date: 10/02/2007   Years since quitting: 16.3  Smokeless Tobacco Never   Counseling given: Not Answered   Outpatient Medications Prior to Visit  Medication Sig Dispense Refill   albuterol  (VENTOLIN  HFA) 108 (90 Base) MCG/ACT  inhaler INHALE 2 PUFFS INTO THE LUNGS EVERY 6 HOURS AS NEEDED FOR WHEEZING OR SHORTNESS OF BREATH 8.5 g 0   cyanocobalamin  (VITAMIN B12) 1000 MCG tablet Take 1,000 mcg by mouth daily.     ferrous sulfate  325 (65 FE) MG tablet Take 325 mg by mouth daily with breakfast.     fluticasone  (FLONASE ) 50 MCG/ACT nasal spray SHAKE LIQUID AND USE 2 SPRAYS IN EACH NOSTRIL DAILY 16 g 2   gabapentin  (NEURONTIN ) 300 MG capsule Take 600 mg by mouth at bedtime.     loratadine (CLARITIN) 10 MG tablet Take 20 mg by mouth every evening.      meloxicam  (MOBIC ) 15 MG tablet Take 15 mg by mouth daily.     Multiple Vitamins-Minerals (MULTIVITAMIN WOMEN) TABS Take 1 tablet by mouth daily with breakfast.     pantoprazole  (PROTONIX ) 40 MG tablet Take 1 tablet (40 mg total) by mouth 2 (two) times daily. (Patient taking differently: Take 40 mg by mouth 2 (two) times daily as needed (for reflux- 30 minutes prior to food).) 60 tablet 11   pramipexole  (MIRAPEX ) 1 MG tablet Take 1 mg by mouth at bedtime.     sertraline  (ZOLOFT ) 50 MG tablet Take 50 mg by mouth at bedtime.     budesonide -formoterol  (SYMBICORT ) 160-4.5 MCG/ACT inhaler INHALE 2 PUFFS INTO THE LUNGS TWICE DAILY 10.2 g 0   predniSONE  (DELTASONE ) 10 MG tablet 4 tabs twice daily X 3 days -->4 tabs daily X 3 days-->3 tabs daily X 3 days--> 2 tabs daily X 3 days--> 1 tab daily X 3 days, then STOP. (Patient not taking: Reported on 01/22/2024) 60 tablet 0   torsemide  (DEMADEX ) 100 MG tablet Take 1 tablet (100 mg total) by mouth daily as needed. (Patient not taking: Reported on 01/22/2024) 30 tablet 0   traMADol  (ULTRAM ) 50 MG tablet Take 1 tablet (50 mg total) by mouth every 6 (six) hours as needed for moderate pain or severe pain. (Patient not taking: Reported on 01/22/2024) 30 tablet 0   No facility-administered medications prior to visit.     Review of Systems:   Constitutional:   No  weight loss, night sweats,  Fevers, chills, fatigue, or  lassitude.  HEENT:   No headaches,  Difficulty swallowing,  Tooth/dental problems, or  Sore throat,                No sneezing, itching, ear ache, nasal congestion, post nasal drip,   CV:  No chest pain,  Orthopnea, PND, swelling in lower extremities, anasarca, dizziness, palpitations, syncope.   GI  No heartburn, indigestion, abdominal pain, nausea, vomiting, diarrhea, change in bowel habits, loss of appetite, bloody stools.   Resp: No shortness of breath at rest.  No excess mucus, no productive cough,  No non-productive cough,  No coughing up of  blood.  No change in color of mucus.  No wheezing.  No chest wall deformity.  Positive for dyspnea on exertion  Skin: no rash or lesions.  GU: no dysuria, change in color of urine, no urgency or frequency.  No flank pain, no hematuria   MS:  No joint pain or swelling.  No decreased range of motion.  No back pain.    Physical Exam  BP 118/69 (BP Location: Left Arm, Patient Position: Sitting, Cuff Size: Normal)   Pulse 73   Ht 5' 6 (1.676 m)   Wt 174 lb 9.6 oz (79.2 kg)   SpO2 92%   BMI 28.18 kg/m  GEN: A/Ox3; pleasant , NAD, well nourished.  Speaks in full sentences.   HEENT:  [place]/AT,  EACs-clear, TMs-wnl, NOSE-clear, THROAT-clear, no lesions, no postnasal drip or exudate noted.   NECK:  Supple w/ fair ROM; no JVD; normal carotid impulses w/o bruits; no thyromegaly or nodules palpated; no lymphadenopathy.    RESP  Clear  P & A; w/o, wheezes/ rales/ or rhonchi. no accessory muscle use, no dullness to percussion.  CARD:  RRR, no m/r/g, no peripheral edema, pulses intact, no cyanosis or clubbing.  GI:   Soft & nt; nml bowel sounds; no organomegaly or masses detected.   Musco: Warm bil, no deformities or joint swelling noted.   Neuro: alert, no focal deficits noted.    Skin: Warm, no lesions or rashes    Lab Results:  CBC    Component Value Date/Time   WBC 3.9 (L) 12/26/2023 0436   RBC 3.97 12/26/2023 0436   HGB 12.4 12/26/2023 0436   HCT 40.0 12/26/2023 0436   PLT 205 12/26/2023 0436   MCV 100.8 (H) 12/26/2023 0436   MCH 31.2 12/26/2023 0436   MCHC 31.0 12/26/2023 0436   RDW 13.4 12/26/2023 0436   LYMPHSABS 0.6 (L) 12/25/2023 0940   MONOABS 0.6 12/25/2023 0940   EOSABS 0.2 12/25/2023 0940   BASOSABS 0.0 12/25/2023 0940    BMET    Component Value Date/Time   NA 134 (L) 12/26/2023 0436   K 4.2 12/26/2023 0436   CL 99 12/26/2023 0436   CO2 25 12/26/2023 0436   GLUCOSE 158 (H) 12/26/2023 0436   BUN 10 12/26/2023 0436   CREATININE 0.47 12/26/2023 0436    CALCIUM 8.6 (L) 12/26/2023 0436   GFRNONAA >60 12/26/2023 0436   GFRAA >60 01/09/2019 0424    BNP No results found for: BNP  ProBNP No results found for: PROBNP  Imaging: ECHOCARDIOGRAM COMPLETE Result Date: 12/26/2023    ECHOCARDIOGRAM REPORT   Patient Name:   ASPIN PALOMAREZ [facility] Date of Exam: 12/26/2023 Medical Rec #:  [id number]            Height:       60.0 in Accession #:    [id number]           Weight:       165.2 lb Date of Birth:  08/02/1940            BSA:          1.721 m Patient Age:    83 years             BP:           113/67 mmHg Patient Gender: F                    HR:           71 bpm. Exam Location:  Inpatient Procedure: 2D Echo, 3D Echo, Cardiac Doppler, Color Doppler and Strain Analysis            (Both Spectral and Color Flow Doppler were utilized during            procedure). Indications:     Abnormal EKG  History:         Patient has no prior history of Echocardiogram examinations.                  Abnormal ECG.  Sonographer:     Therisa Crouch Referring Phys:  [id number] [facility] DAHAL Diagnosing Phys: Ria Commander IMPRESSIONS  1.  Left ventricular ejection fraction, by estimation, is 60 to 65%. The left ventricle has normal function. The left ventricle has no regional wall motion abnormalities. Left ventricular diastolic parameters are indeterminate. The average left ventricular global longitudinal strain is -20.9 %. The global longitudinal strain is normal.  2. Right ventricular systolic function is normal. The right ventricular size is normal. There is mildly elevated pulmonary artery systolic pressure. The estimated right ventricular systolic pressure is 36.9 mmHg.  3. Left atrial size was mild to moderately dilated.  4. The mitral valve is normal in structure. No evidence of mitral valve regurgitation. No evidence of mitral stenosis.  5. The aortic valve is normal in structure. Aortic valve regurgitation is trivial. No aortic stenosis is present.  6. The inferior vena cava is  dilated in size with >50% respiratory variability, suggesting right atrial pressure of 8 mmHg. FINDINGS  Left Ventricle: Left ventricular ejection fraction, by estimation, is 60 to 65%. The left ventricle has normal function. The left ventricle has no regional wall motion abnormalities. The average left ventricular global longitudinal strain is -20.9 %. Strain was performed and the global longitudinal strain is normal. The left ventricular internal cavity size was normal in size. There is no left ventricular hypertrophy. Left ventricular diastolic parameters are indeterminate. Right Ventricle: The right ventricular size is normal. No increase in right ventricular wall thickness. Right ventricular systolic function is normal. There is mildly elevated pulmonary artery systolic pressure. The tricuspid regurgitant velocity is 2.69  m/s, and with an assumed right atrial pressure of 8 mmHg, the estimated right ventricular systolic pressure is 36.9 mmHg. Left Atrium: Left atrial size was mild to moderately dilated. Right Atrium: Right atrial size was normal in size. Pericardium: There is no evidence of pericardial effusion. Mitral Valve: The mitral valve is normal in structure. No evidence of mitral valve regurgitation. No evidence of mitral valve stenosis. Tricuspid Valve: The tricuspid valve is normal in structure. Tricuspid valve regurgitation is not demonstrated. No evidence of tricuspid stenosis. Aortic Valve: The aortic valve is normal in structure. Aortic valve regurgitation is trivial. No aortic stenosis is present. Pulmonic Valve: The pulmonic valve was normal in structure. Pulmonic valve regurgitation is not visualized. No evidence of pulmonic stenosis. Aorta: The aortic root is normal in size and structure. Venous: The inferior vena cava is dilated in size with greater than 50% respiratory variability, suggesting right atrial pressure of 8 mmHg. IAS/Shunts: No atrial level shunt detected by color flow Doppler.   LEFT VENTRICLE PLAX 2D LVIDd:         4.50 cm   Diastology LVIDs:         3.00 cm   LV e' medial:    7.40 cm/s LV PW:         1.20 cm   LV E/e' medial:  11.8 LV IVS:        1.00 cm   LV e' lateral:   11.50 cm/s LVOT diam:     2.00 cm   LV E/e' lateral: 7.6 LVOT Area:     3.14 cm                          2D Longitudinal Strain                          2D Strain GLS Avg:     -20.9 %  3D Volume EF                          LV 3D EDV:   59.44 ml                          LV 3D ESV:   25.39 ml RIGHT VENTRICLE             IVC RV Basal diam:  3.80 cm     IVC diam: 2.60 cm RV S prime:     12.50 cm/s TAPSE (M-mode): 2.6 cm LEFT ATRIUM             Index        RIGHT ATRIUM           Index LA diam:        3.10 cm 1.80 cm/m   RA Area:     18.50 cm LA Vol (A2C):   77.6 ml 45.09 ml/m  RA Volume:   48.70 ml  28.30 ml/m LA Vol (A4C):   57.5 ml 33.41 ml/m LA Biplane Vol: 68.6 ml 39.86 ml/m   AORTA Ao Root diam: 3.00 cm Ao Asc diam:  3.60 cm MITRAL VALVE                TRICUSPID VALVE MV Area (PHT): 4.89 cm     TR Peak grad:   28.9 mmHg MV E velocity: 87.50 cm/s   TR Vmax:        269.00 cm/s MV A velocity: 113.00 cm/s MV E/A ratio:  0.77         SHUNTS                             Systemic Diam: 2.00 cm Aditya Sabharwal Electronically signed by Ria Commander Signature Date/Time: 12/26/2023/5:23:17 PM    Final (Updated)    DG Chest Port 1 View Result Date: 12/25/2023 CLINICAL DATA:  Shortness of breath. EXAM: PORTABLE CHEST 1 VIEW COMPARISON:  August 13, 2023. FINDINGS: Stable cardiomediastinal silhouette. Minimal bibasilar subsegmental atelectasis or scarring is noted. Degenerative changes are seen involving both glenohumeral joints. IMPRESSION: Minimal bibasilar subsegmental atelectasis or scarring. Electronically Signed   By: Lynwood Landy Raddle M.D.   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 -  Encouraged her to continue vitamin B12 and multivitamin supplementation Pulmonary artery hypertension (HCC) - Echo completed on 12/26/2023 demonstrates RVSP of 37  - Today we discussed the possibility of a right heart catheterization for further evaluation; supplemental information on pulmonary hypertension provided at this visit -  She would like to consider this option  and revisit at her follow up -  Recommend daily weights and to take Torsemide  prn for weight gain/edema Personal history of tobacco use, presenting hazards to health - She is overdue for her screening chest CT; ordered today -This will also provide another opportunity to look at her lung architecture in the setting of worsening dyspnea   Return in about 6 weeks (around 03/04/2024) for COPD, CT results.  Candis Dandy, PA-C 01/22/2024

## 2024-01-23 ENCOUNTER — Telehealth: Payer: Self-pay

## 2024-01-23 NOTE — Telephone Encounter (Signed)
 Copied from CRM [id number]. Topic: General - Other >> Jan 23, 2024  1:07 PM Shona S wrote: Reason for CRM: DR. CHARLEY recommendation to take 1000 mcg of vit b12, daily. Patient is concerned about the intake and would like to speak to the recommending doctor to see if its a bit too much please contact patient   ATC X1. LMTCB   Per lov with Candis CHARLEY, PA    Encouraged her to continue vitamin B12 and multivitamin supplementation

## 2024-01-24 ENCOUNTER — Ambulatory Visit: Admitting: Internal Medicine

## 2024-01-24 ENCOUNTER — Encounter: Payer: Self-pay | Admitting: Internal Medicine

## 2024-01-24 VITALS — BP 118/70 | HR 60 | Ht 66.0 in | Wt 172.0 lb

## 2024-01-24 DIAGNOSIS — K449 Diaphragmatic hernia without obstruction or gangrene: Secondary | ICD-10-CM | POA: Diagnosis not present

## 2024-01-24 DIAGNOSIS — K219 Gastro-esophageal reflux disease without esophagitis: Secondary | ICD-10-CM | POA: Diagnosis not present

## 2024-01-24 DIAGNOSIS — J392 Other diseases of pharynx: Secondary | ICD-10-CM

## 2024-01-24 NOTE — Patient Instructions (Signed)
 Please follow up as needed.  _______________________________________________________  If your blood pressure at your visit was 140/90 or greater, please contact your primary care physician to follow up on this.  _______________________________________________________  If you are age 81 or older, your body mass index should be between 23-30. Your Body mass index is 27.76 kg/m. If this is out of the aforementioned range listed, please consider follow up with your Primary Care Provider.  If you are age 31 or younger, your body mass index should be between 19-25. Your Body mass index is 27.76 kg/m. If this is out of the aformentioned range listed, please consider follow up with your Primary Care Provider.   ________________________________________________________  The [facility] providers would like to encourage you to use MYCHART to communicate with providers for non-urgent requests or questions.  Due to long hold times on the telephone, sending your provider a message by [facility] may be a faster and more efficient way to get a response.  Please allow 48 business hours for a response.  Please remember that this is for non-urgent requests.  _______________________________________________________  [facility] is using a team-based approach to care.  Your team is made up of your doctor and two to three APPS. Our APPS (Nurse Practitioners and Physician Assistants) work with your physician to ensure care continuity for you. They are fully qualified to address your health concerns and develop a treatment plan. They communicate directly with your gastroenterologist to care for you. Seeing the Advanced Practice Practitioners on your physician's team can help you by facilitating care more promptly, often allowing for earlier appointments, access to diagnostic testing, procedures, and other specialty referrals.

## 2024-01-24 NOTE — Progress Notes (Signed)
 HISTORY OF PRESENT ILLNESS:  Lindsay Porter is a 84 y.o. female with multiple medical problems as listed below.  She presents today for follow-up.  She is accompanied by her husband Octaviano.  She was last seen October 22, 2023 regarding active GERD symptoms, pharyngeal symptoms, and hoarseness.  She does have a history of complex hiatal hernia repair with recurrence.  At the time of her last visit she was prescribed pantoprazole  40 mg twice daily.  She was told to take this medicine twice daily and follow-up at this time.  Patient tells me that she has been taking medication once daily.  She was hospitalized at the end of June for 3 days for a COPD flare.  On once daily medication she has less reflux.  However, she continues with breakthrough symptoms for which she takes baking soda.  Raspy voice persists.  No new complaints.  REVIEW OF SYSTEMS:  All non-GI ROS negative except for arthritis, back pain, confusion, cough, fatigue, headaches, muscle cramps, sleeping problems, sore throat, voice change, shortness of breath  Past Medical History:  Diagnosis Date   Alcohol abuse    Allergy    Anemia, iron deficiency    Arthritis    Carpal tunnel syndrome    COPD (chronic obstructive pulmonary disease) (HCC)    Depression    Diverticulosis    Dyspnea    GERD (gastroesophageal reflux disease)    Hiatal hernia    Hyperlipidemia    Insomnia    Osteopenia    Ovarian cyst    Pneumonia 07/2022   Pre-diabetes    Restless leg syndrome    Seasonal allergies    Vitamin D deficiency    Wears contact lenses     Past Surgical History:  Procedure Laterality Date   ANKLE SURGERY  07/07/2009   left   APPENDECTOMY     BREAST LUMPECTOMY Left    BUNIONECTOMY  2006   left   CARPAL TUNNEL RELEASE Right    COLONOSCOPY     HIATAL HERNIA REPAIR  04/19/2012   Procedure: LAPAROSCOPIC REPAIR OF HIATAL HERNIA;  Surgeon: Morene ONEIDA Olives, MD;  Location: [facility];  Service: General;  Laterality: N/A;    INGUINAL HERNIA REPAIR Right 06/04/2014   Procedure: HERNIA REPAIR INGUINAL ADULT;  Surgeon: Donnice Lima, MD;  Location: [facility];  Service: General;  Laterality: Right;   INGUINAL HERNIA REPAIR Left 10/11/2022   Procedure: LEFT HERNIA REPAIR INGUINAL ADULT WITH MESH;  Surgeon: Lima Donnice, MD;  Location: [facility];  Service: General;  Laterality: Left;  TAP BLOCK   INSERTION OF MESH Right 06/04/2014   Procedure: INSERTION OF MESH;  Surgeon: Donnice Lima, MD;  Location: [facility];  Service: General;  Laterality: Right;   INSERTION OF MESH Left 10/11/2022   Procedure: INSERTION OF MESH;  Surgeon: Lima Donnice, MD;  Location: [facility];  Service: General;  Laterality: Left;   KNEE ARTHROPLASTY Left    KNEE SURGERY  2005   lt   nose surgery  12/2010   SHOULDER SURGERY  1950   right   TONSILLECTOMY     UPPER GASTROINTESTINAL ENDOSCOPY     UPPER GI ENDOSCOPY      Social History Jareli Highland  reports that she quit smoking about 16 years ago. Her smoking use included cigarettes. She started smoking about 61 years ago. She has a 90 pack-year smoking history. She has never used smokeless tobacco. She reports current alcohol use of about 21.0  standard drinks of alcohol per week. She reports that she does not use drugs.  family history includes Alcoholism in her father; Arthritis in her sister; Asthma in her mother; Rheum arthritis in her mother.  Allergies  Allergen Reactions   Singulair [Montelukast Sodium] Hives   Sulfa Antibiotics Other (See Comments)    Childhood reaction        PHYSICAL EXAMINATION: Vital signs: BP 118/70   Pulse 60   Ht 5' 6 (1.676 m)   Wt 172 lb (78 kg)   BMI 27.76 kg/m   Constitutional: generally well-appearing, no acute distress Psychiatric: alert and oriented x3, cooperative Eyes: extraocular movements intact, anicteric, conjunctiva pink Mouth: oral pharynx moist, no lesions Neck: supple no  lymphadenopathy Cardiovascular: heart regular rate and rhythm, no murmur Lungs: clear to auscultation bilaterally Abdomen: soft, nontender, nondistended, no obvious ascites, no peritoneal signs, normal bowel sounds, no organomegaly Rectal: Omitted Extremities: no clubbing, cyanosis, or lower extremity edema bilaterally Skin: no lesions on visible extremities Neuro: No focal deficits.  Cranial nerves intact  ASSESSMENT:  1.  GERD.  Symptoms improved on once daily PPI.  Still with breakthrough. 2.  Complex hiatal hernia status post repair with recurrence 3.  Pharyngeal symptoms ongoing 4.  Multiple significant medical problems   PLAN:  1.  Reflux precautions 2.  Encouraged to take pantoprazole  40 mg p.o. twice daily.  She will try. 3.  Resume general medical care with PCP.  GI follow-up as needed A total time of 30 minutes was spent preparing to see the patient, obtaining interval history, performing medically appropriate physical exam, counseling and educating the patient and her husband regarding the above listed issues, ordering medication, reviewing medication risk, and documenting clinical information in the health record

## 2024-01-28 NOTE — Telephone Encounter (Signed)
 I called and spoke to pt. Pt stated that the daily value was 41,000 and the MCG was 1,000. I informed pt that this was okay to take per Candis Dandy, PA and her meds were reviewed with her during her lov. Pt verbalized understanding.; NFN

## 2024-02-07 ENCOUNTER — Ambulatory Visit (HOSPITAL_BASED_OUTPATIENT_CLINIC_OR_DEPARTMENT_OTHER)
Admission: RE | Admit: 2024-02-07 | Discharge: 2024-02-07 | Disposition: A | Discharge status: Home / Self Care | Source: Ambulatory Visit

## 2024-02-07 DIAGNOSIS — K449 Diaphragmatic hernia without obstruction or gangrene: Secondary | ICD-10-CM | POA: Diagnosis not present

## 2024-02-07 DIAGNOSIS — Z122 Encounter for screening for malignant neoplasm of respiratory organs: Secondary | ICD-10-CM | POA: Diagnosis present

## 2024-02-07 DIAGNOSIS — I2510 Atherosclerotic heart disease of native coronary artery without angina pectoris: Secondary | ICD-10-CM | POA: Diagnosis not present

## 2024-02-07 DIAGNOSIS — Z87891 Personal history of nicotine dependence: Secondary | ICD-10-CM | POA: Diagnosis not present

## 2024-02-07 DIAGNOSIS — J449 Chronic obstructive pulmonary disease, unspecified: Secondary | ICD-10-CM | POA: Insufficient documentation

## 2024-02-07 DIAGNOSIS — J439 Emphysema, unspecified: Secondary | ICD-10-CM | POA: Insufficient documentation

## 2024-02-07 DIAGNOSIS — I700 Atherosclerosis of aorta: Secondary | ICD-10-CM | POA: Insufficient documentation

## 2024-02-14 ENCOUNTER — Telehealth: Payer: Self-pay

## 2024-02-14 NOTE — Telephone Encounter (Signed)
 Copied from CRM #[id number]. Topic: Clinical - Lab/Test Results >> Feb 14, 2024  2:18 PM Corean SAUNDERS wrote: Reason for CRM: Patient is requesting a phone call back from Candis Dandy or nurse when her CT results from 8/7 are in.  ATC x1 LVM for patient to call our office back

## 2024-02-22 ENCOUNTER — Telehealth: Payer: Self-pay

## 2024-02-22 DIAGNOSIS — R911 Solitary pulmonary nodule: Secondary | ICD-10-CM

## 2024-02-22 NOTE — Telephone Encounter (Signed)
 Copied from CRM #[id number]. Topic: Clinical - Lab/Test Results >> Feb 22, 2024 10:44 AM Shona RAMAN wrote: Reason for CRM: patient calling for ct results, gave what I saw, patient would still like a callback because she still doesn't understand her results. Please call patient back.

## 2024-02-25 ENCOUNTER — Ambulatory Visit

## 2024-02-25 VITALS — BP 127/79 | HR 70 | Temp 98.4°F | Ht 66.0 in | Wt 174.2 lb

## 2024-02-25 DIAGNOSIS — I2510 Atherosclerotic heart disease of native coronary artery without angina pectoris: Secondary | ICD-10-CM | POA: Diagnosis not present

## 2024-02-25 DIAGNOSIS — I2721 Secondary pulmonary arterial hypertension: Secondary | ICD-10-CM

## 2024-02-25 DIAGNOSIS — J449 Chronic obstructive pulmonary disease, unspecified: Secondary | ICD-10-CM | POA: Diagnosis not present

## 2024-02-25 DIAGNOSIS — Z87891 Personal history of nicotine dependence: Secondary | ICD-10-CM

## 2024-02-25 DIAGNOSIS — F1029 Alcohol dependence with unspecified alcohol-induced disorder: Secondary | ICD-10-CM | POA: Diagnosis not present

## 2024-02-25 NOTE — Assessment & Plan Note (Addendum)
-   Doing well on Trelegy plan to continue -Continue albuterol  MDI as needed -  Needs new PFTs but patient would like to wait; readdress at follow up

## 2024-02-25 NOTE — Telephone Encounter (Signed)
 Please advise pt LCS CT results.

## 2024-02-25 NOTE — Assessment & Plan Note (Signed)
-    Remains abstinent from ETOH; congratulated her on her efforts -  Recommend continue daily B complex vitamin

## 2024-02-25 NOTE — Addendum Note (Signed)
 Addended by: RHETT KELLY POUR on: 02/25/2024 03:55 PM   Modules accepted: Orders

## 2024-02-25 NOTE — Telephone Encounter (Signed)
 LDCT was ordered at the time of the patient's visit. However pt quit smoking in 2009 making patient no longer eligible for LCS as she quit more than 15 years ago (CMS guidelines). I spoke with Candis Dandy PA and she advised to cancel the LDCT and order a CT Chest wo in a year for nodule follow up; done. Nothing further need at this time.

## 2024-02-25 NOTE — Telephone Encounter (Signed)
 Patient has OV with Candis Dandy PA today 02/25/24. Results will be reviewed at the appt.

## 2024-02-25 NOTE — Patient Instructions (Signed)
 Continue Trelegy one puff inhaled daily.  Continue Albuterol  as needed for shortness of breath.  Follow up Chest CT in one year; ordered today.  Continue B Complex daily supplementation.  Follow up with Cardiologist as suggested.  Follow up in 6 months; return to clinic sooner if new or worsening symptoms.

## 2024-02-25 NOTE — Progress Notes (Signed)
 @Patient  ID: Lindsay Porter, female    DOB: 06/07/1941, 83 y.o.   MRN: [id number]  Chief Complaint  Patient presents with   Medical Management of Chronic Issues    CT    Referring provider: Mathew Bouchard, DO  HPI: Lindsay Porter is an 83 y/o female with PMH of COPD and asthma who presents today for follow up.  She was most recently seen in our office on 01/22/2024 as a hospital follow up for a COPD exacerbation admission.  Since her last visit she has transition to Trelegy daily.  She reports that her shortness of breath with exertion and activity tolerance have significantly improved with addition of this medication.  She has not used her albuterol  less often than usual due to this.  She also reports that she remains abstinent from alcohol since her last visit.  She has been taking B complex daily as well.  She completed her screening chest CT.  We reviewed those results today.  These demonstrate mild centrilobular emphysematous changes as well as several scattered pulmonary nodules all less than 4 mm in size.  No new nodules were seen.  There was evidence of two-vessel coronary atherosclerosis and aortic atherosclerosis on this exam.  Overall she states that she is feeling better.  She denies fever, chills, chest pain, headache, dizziness, worsening shortness of breath, night sweats, weight loss.  TEST/EVENTS : Low-dose screening chest CT completed on 02/07/2024:IMPRESSION: 1. Lung-RADS 2, benign appearance or behavior. Continue annual screening with low-dose chest CT without contrast in 12 months. 2. Two-vessel coronary atherosclerosis. 3. Chronic moderate hiatal hernia. 4. Aortic Atherosclerosis (ICD10-I70.0) and Emphysema (ICD10-J43.9).  Allergies  Allergen Reactions   Singulair [Montelukast Sodium] Hives   Sulfa Antibiotics Other (See Comments)    Childhood reaction     Immunization History  Administered Date(s) Administered   INFLUENZA, HIGH DOSE SEASONAL PF 04/26/2019    Influenza Split 05/03/2012, 06/02/2012, 05/03/2013, 03/03/2015   Influenza, Seasonal, Injecte, Preservative Fre 04/23/2014   Influenza,inj,Quad PF,6+ Mos 03/22/2017   Influenza-Unspecified 04/23/2014   Pneumococcal Conjugate-13 04/27/2014   Pneumococcal Polysaccharide-23 06/02/2012   Unspecified SARS-COV-2 Vaccination 07/24/2019, 08/14/2019   Zoster, Live 01/26/2015    Past Medical History:  Diagnosis Date   Alcohol abuse    Allergy    Anemia, iron deficiency    Arthritis    Carpal tunnel syndrome    COPD (chronic obstructive pulmonary disease) (HCC)    Depression    Diverticulosis    Dyspnea    GERD (gastroesophageal reflux disease)    Hiatal hernia    Hyperlipidemia    Insomnia    Osteopenia    Ovarian cyst    Pneumonia 07/2022   Pre-diabetes    Restless leg syndrome    Seasonal allergies    Vitamin D deficiency    Wears contact lenses     Tobacco History: Social History   Tobacco Use  Smoking Status Former   Current packs/day: 0.00   Average packs/day: 2.0 packs/day for 45.0 years (90.0 ttl pk-yrs)   Types: Cigarettes   Start date: 10/02/1962   Quit date: 10/02/2007   Years since quitting: 16.4  Smokeless Tobacco Never   Counseling given: Not Answered   Outpatient Medications Prior to Visit  Medication Sig Dispense Refill   albuterol  (VENTOLIN  HFA) 108 (90 Base) MCG/ACT inhaler INHALE 2 PUFFS INTO THE LUNGS EVERY 6 HOURS AS NEEDED FOR WHEEZING OR SHORTNESS OF BREATH 8.5 g 0   azithromycin  (ZITHROMAX ) 250 MG tablet Take 1  tablet (250 mg total) by mouth daily. Take 2 tabs on day one, then one tab daily until done 6 tablet 0   cyanocobalamin  (VITAMIN B12) 1000 MCG tablet Take 1,000 mcg by mouth daily.     ferrous sulfate  325 (65 FE) MG tablet Take 325 mg by mouth daily with breakfast.     fluticasone  (FLONASE ) 50 MCG/ACT nasal spray SHAKE LIQUID AND USE 2 SPRAYS IN EACH NOSTRIL DAILY 16 g 2   Fluticasone -Umeclidin-Vilant (TRELEGY ELLIPTA ) 100-62.5-25 MCG/ACT  AEPB Inhale 1 puff into the lungs daily. 28 each 5   gabapentin  (NEURONTIN ) 300 MG capsule Take 600 mg by mouth at bedtime.     loratadine (CLARITIN) 10 MG tablet Take 20 mg by mouth every evening.     meloxicam  (MOBIC ) 15 MG tablet Take 15 mg by mouth daily.     Multiple Vitamins-Minerals (MULTIVITAMIN WOMEN) TABS Take 1 tablet by mouth daily with breakfast.     pantoprazole  (PROTONIX ) 40 MG tablet Take 1 tablet (40 mg total) by mouth 2 (two) times daily. (Patient taking differently: Take 40 mg by mouth 2 (two) times daily as needed (for reflux- 30 minutes prior to food).) 60 tablet 11   pramipexole  (MIRAPEX ) 1 MG tablet Take 1 mg by mouth at bedtime.     sertraline  (ZOLOFT ) 50 MG tablet Take 50 mg by mouth at bedtime.     predniSONE  (DELTASONE ) 10 MG tablet 4 tabs twice daily X 3 days -->4 tabs daily X 3 days-->3 tabs daily X 3 days--> 2 tabs daily X 3 days--> 1 tab daily X 3 days, then STOP. (Patient not taking: Reported on 02/25/2024) 60 tablet 0   torsemide  (DEMADEX ) 100 MG tablet Take 1 tablet (100 mg total) by mouth daily as needed. (Patient not taking: Reported on 02/25/2024) 30 tablet 0   traMADol  (ULTRAM ) 50 MG tablet Take 1 tablet (50 mg total) by mouth every 6 (six) hours as needed for moderate pain or severe pain. (Patient not taking: Reported on 02/25/2024) 30 tablet 0   No facility-administered medications prior to visit.     Review of Systems:   Constitutional:   No  weight loss, night sweats,  Fevers, chills, fatigue, or  lassitude.  HEENT:   No headaches,  Difficulty swallowing,  Tooth/dental problems, or  Sore throat,                No sneezing, itching, ear ache, nasal congestion, post nasal drip,   CV:  No chest pain,  Orthopnea, PND, swelling in lower extremities, anasarca, dizziness, palpitations, syncope.   GI  No heartburn, indigestion, abdominal pain, nausea, vomiting, diarrhea, change in bowel habits, loss of appetite, bloody stools.   Resp: No shortness of breath  with exertion or at rest.  No excess mucus, no productive cough,  No non-productive cough,  No coughing up of blood.  No change in color of mucus.  No wheezing.  No chest wall deformity  Skin: no rash or lesions.  GU: no dysuria, change in color of urine, no urgency or frequency.  No flank pain, no hematuria   MS:  No joint pain or swelling.  No decreased range of motion.  No back pain.    Physical Exam  BP 127/79   Pulse 70   Temp 98.4 F (36.9 C) (Temporal)   Ht 5' 6 (1.676 m)   Wt 174 lb 3.2 oz (79 kg)   SpO2 91%   BMI 28.12 kg/m   GEN: A/Ox3; pleasant ,  NAD, well nourished    HEENT:  [place]/AT,  EACs-clear, TMs-wnl, NOSE-clear, THROAT-clear, no lesions, no postnasal drip or exudate noted.   NECK:  Supple w/ fair ROM; no JVD; normal carotid impulses w/o bruits; no thyromegaly or nodules palpated; no lymphadenopathy.    RESP  Clear  P & A; w/o, wheezes/ rales/ or rhonchi. no accessory muscle use, no dullness to percussion  CARD:  RRR, no m/r/g, no peripheral edema, pulses intact, no cyanosis or clubbing.  GI:   Soft & nt; nml bowel sounds; no organomegaly or masses detected.   Musco: Warm bil, no deformities or joint swelling noted.   Neuro: alert, no focal deficits noted.    Skin: Warm, no lesions or rashes    Lab Results:  CBC    Component Value Date/Time   WBC 3.9 (L) 12/26/2023 0436   RBC 3.97 12/26/2023 0436   HGB 12.4 12/26/2023 0436   HCT 40.0 12/26/2023 0436   PLT 205 12/26/2023 0436   MCV 100.8 (H) 12/26/2023 0436   MCH 31.2 12/26/2023 0436   MCHC 31.0 12/26/2023 0436   RDW 13.4 12/26/2023 0436   LYMPHSABS 0.6 (L) 12/25/2023 0940   MONOABS 0.6 12/25/2023 0940   EOSABS 0.2 12/25/2023 0940   BASOSABS 0.0 12/25/2023 0940    BMET    Component Value Date/Time   NA 134 (L) 12/26/2023 0436   K 4.2 12/26/2023 0436   CL 99 12/26/2023 0436   CO2 25 12/26/2023 0436   GLUCOSE 158 (H) 12/26/2023 0436   BUN 10 12/26/2023 0436   CREATININE 0.47  12/26/2023 0436   CALCIUM 8.6 (L) 12/26/2023 0436   GFRNONAA >60 12/26/2023 0436   GFRAA >60 01/09/2019 0424    BNP No results found for: BNP  ProBNP No results found for: PROBNP  Imaging: CT CHEST LUNG CA SCREEN LOW DOSE W/O CM Result Date: 02/22/2024 CLINICAL DATA:  Former smoker with 90 pack-year smoking history EXAM: CT CHEST WITHOUT CONTRAST LOW-DOSE FOR LUNG CANCER SCREENING TECHNIQUE: Multidetector CT imaging of the chest was performed following the standard protocol without IV contrast. RADIATION DOSE REDUCTION: This exam was performed according to the departmental dose-optimization program which includes automated exposure control, adjustment of the mA and/or kV according to patient size and/or use of iterative reconstruction technique. COMPARISON:  05/19/2022 screening chest CT FINDINGS: Cardiovascular: Normal heart size. No significant pericardial effusion/thickening. Left anterior descending and right coronary atherosclerosis. Atherosclerotic nonaneurysmal thoracic aorta. Normal caliber pulmonary arteries. Mediastinum/Nodes: No significant thyroid  nodules. Unremarkable esophagus. No pathologically enlarged axillary, mediastinal or hilar lymph nodes, noting limited sensitivity for the detection of hilar adenopathy on this noncontrast study. Lungs/Pleura: No pneumothorax. No pleural effusion. Mild centrilobular emphysema with diffuse bronchial wall thickening. No acute consolidative airspace disease or lung masses. Stable scattered tiny solid pulmonary nodules up to 3.4 mm in the superior segment right lower lobe on image 113. No new significant pulmonary nodules. Upper abdomen: Chronic moderate hiatal hernia. Musculoskeletal: No aggressive appearing focal osseous lesions. Marked thoracic spondylosis. IMPRESSION: 1. Lung-RADS 2, benign appearance or behavior. Continue annual screening with low-dose chest CT without contrast in 12 months. 2. Two-vessel coronary atherosclerosis. 3. Chronic  moderate hiatal hernia. 4. Aortic Atherosclerosis (ICD10-I70.0) and Emphysema (ICD10-J43.9). Electronically Signed   By: Selinda DELENA Blue M.D.   On: 02/22/2024 09:56    Administration History     None           No data to display          No  results found for: NITRICOXIDE   Assessment & Plan:   Assessment & Plan Chronic obstructive pulmonary disease, unspecified COPD type (HCC) - Doing well on Trelegy plan to continue -Continue albuterol  MDI as needed -  Needs new PFTs but patient would like to wait; readdress at follow up Atherosclerosis of coronary artery without angina pectoris, unspecified vessel or lesion type, unspecified whether native or transplanted heart -  Recommend Cardiology follow up -  Patient agreeable but would like to look at possible options before referral Alcohol dependence with unspecified alcohol-induced disorder (HCC) -  Remains abstinent from ETOH; congratulated her on her efforts -  Recommend continue daily B complex vitamin Pulmonary artery hypertension (HCC) -  CT demonstrated normal sized caliber of pulmonary arteries and only mild elevation in RVSP -  Yearly Echo Personal history of tobacco use, presenting hazards to health -  Yearly LDCT screening with Chest CT   Return in about 6 months (around 08/27/2024).  Candis Dandy, PA-C 02/25/2024

## 2024-03-10 ENCOUNTER — Other Ambulatory Visit: Payer: Self-pay | Admitting: Pulmonary Disease

## 2024-05-15 ENCOUNTER — Telehealth: Payer: Self-pay | Admitting: Pulmonary Disease

## 2024-05-15 ENCOUNTER — Telehealth: Payer: Self-pay

## 2024-05-15 NOTE — Telephone Encounter (Signed)
 Copied from CRM #[id number]. Topic: Clinical - Medication Question >> May 15, 2024  4:15 PM Rozanna MATSU wrote: Reason for CRM: pt calling to speak with Lesile in surgery clearance stated she had some more questions and needed some clarity on some things they discussed.

## 2024-05-15 NOTE — Telephone Encounter (Signed)
 Fax received from Dr. Elspeth Her with [facility]  to perform a right total total shoulder arthroplasty on patient.  Patient needs surgery clearance. Surgery is pending. Patient was seen on 02/25/24. Office protocol is a risk assessment can be sent to surgeon if patient has been seen in 60 days or less.   Pt needs appt for risk assessment- scheduled with Dr Kara 06/23/24

## 2024-05-16 NOTE — Telephone Encounter (Signed)
 Called and spoke with the pt  She states that she feels that she should not have to come in for appt  She spoke with ortho and they told her that as long as she has been seen here in the past 6 months it should be ok Candis has seen her past 2 visits  Routing msg to Rupert to see if she is willing to add risk assessment to the last note  Please advise, thanks!  Fax received from Dr. Elspeth Her with [facility]  to perform a right total total shoulder arthroplasty on patient.  Patient needs surgery clearance. Surgery is pending. Patient was seen on 02/25/24. Office protocol is a risk assessment can be sent to surgeon if patient has been seen in 60 days or less.

## 2024-05-20 ENCOUNTER — Telehealth (HOSPITAL_BASED_OUTPATIENT_CLINIC_OR_DEPARTMENT_OTHER): Payer: Self-pay | Admitting: *Deleted

## 2024-05-20 NOTE — Progress Notes (Unsigned)
 Cardiology Office Note:  .   Date:  05/21/2024  ID:  Lindsay Porter, DOB Nov 21, 1940, MRN [id number] PCP: Mathew Bouchard, DO  [facility] Providers Cardiologist:  None  History of Present Illness: .    Chief Complaint  Patient presents with   Shortness of Breath    Lindsay Porter is a 83 y.o. female with history of COPD, coronary calcifications who presents for the evaluation of preop at the request of Mathew, Megan, DO.   History of Present Illness   Lindsay Porter is an 83 year old female with COPD who presents for preoperative assessment.  She experiences shortness of breath, which led to a hospital visit in June for a COPD exacerbation. During this hospitalization, an echocardiogram showed normal heart function but elevated pressures on the right side of the heart. She was hospitalized for three and a half days and discharged once her oxygen levels stabilized above 90%. Her oxygen levels improve when she stops talking.  She is preparing for shoulder surgery and requires cardiology clearance. She experiences pain in her shoulder, with radiation to her arms, collarbones, and upper back.  Her past medical history includes COPD, for which she takes two breathing medications. She has a history of smoking but has since quit. No history of heart attack or stroke and no chest pain. She experiences swelling in her left leg from mid-shin to the ankle, but not in the right leg.  Her social history includes being married with one child and one grandchild. She used to be more active but currently has limited physical activity due to a foot issue. She occasionally goes to the gym and can climb stairs, though she becomes short of breath after two flights. She denies drug use.          Problem List COPD Coronary calcifications  HLD -T chol 224, LDL 106, HDL 104, TG 46 Etoh    ROS: All other ROS reviewed and negative. Pertinent positives noted in the HPI.      Studies Reviewed: SABRA   EKG Interpretation Date/Time:  Wednesday May 21 2024 15:31:20 EST Ventricular Rate:  79 PR Interval:  158 QRS Duration:  84 QT Interval:  358 QTC Calculation: 410 R Axis:   39  Text Interpretation: Normal sinus rhythm Normal ECG Confirmed by Barbaraann Kotyk [phone]) on 05/21/2024 3:33:03 PM   TTE 12/26/2023  1. Left ventricular ejection fraction, by estimation, is 60 to 65%. The  left ventricle has normal function. The left ventricle has no regional  wall motion abnormalities. Left ventricular diastolic parameters are  indeterminate. The average left  ventricular global longitudinal strain is -20.9 %. The global longitudinal  strain is normal.   2. Right ventricular systolic function is normal. The right ventricular  size is normal. There is mildly elevated pulmonary artery systolic  pressure. The estimated right ventricular systolic pressure is 36.9 mmHg.   3. Left atrial size was mild to moderately dilated.   4. The mitral valve is normal in structure. No evidence of mitral valve  regurgitation. No evidence of mitral stenosis.   5. The aortic valve is normal in structure. Aortic valve regurgitation is  trivial. No aortic stenosis is present.   6. The inferior vena cava is dilated in size with >50% respiratory  variability, suggesting right atrial pressure of 8 mmHg.  Physical Exam:   VS:  BP 126/60   Pulse 79   Ht 5' 5 (1.651 m)   Wt 180 lb (81.6  kg)   SpO2 91%   BMI 29.95 kg/m    Wt Readings from Last 3 Encounters:  05/21/24 180 lb (81.6 kg)  05/21/24 180 lb (81.6 kg)  02/25/24 174 lb 3.2 oz (79 kg)    GEN: Well nourished, well developed in no acute distress NECK: No JVD; No carotid bruits CARDIAC: RRR, no murmurs, rubs, gallops RESPIRATORY:  Clear to auscultation without rales, wheezing or rhonchi  ABDOMEN: Soft, non-tender, non-distended EXTREMITIES:  No edema; No deformity  ASSESSMENT AND PLAN: .   Assessment and Plan     Preoperative cardiovascular risk assessment for shoulder surgery Preoperative cardiovascular risk assessment required for upcoming shoulder surgery. Echocardiogram in June showed normal heart function with elevated right-sided heart pressures related to COPD. EKG is normal. - Ordered stress test to assess cardiac function and blood flow. - Ordered calcium score test to evaluate coronary artery calcification. - Will coordinate with Dr. Kay for surgical clearance based on test results.  Coronary artery calcification Stress test and calcium score test will help determine the extent of calcification and guide management. - Ordered calcium score test to quantify coronary artery calcification. - Ordered stress test to evaluate blood flow and cardiac function.  Chronic obstructive pulmonary disease (COPD) COPD with recent exacerbation requiring hospitalization in June. Currently managed with two breathing treatments. Echocardiogram showed elevated right-sided heart pressures related to COPD. - Continue current COPD management with breathing treatments. - Ensure follow-up with pulmonologist Dr. Kara for ongoing management.  Shortness of breath Exertion, likely related to COPD and possible cardiac involvement. - Ordered stress test to assess cardiac function and contribution to shortness of breath.  Mixed hyperlipidemia Management will be guided by results of calcium score test. - Will evaluate need for statin therapy based on calcium score test results.          Informed Consent   Shared Decision Making/Informed Consent The risks [chest pain, shortness of breath, cardiac arrhythmias, dizziness, blood pressure fluctuations, myocardial infarction, stroke/transient ischemic attack, nausea, vomiting, allergic reaction, radiation exposure, metallic taste sensation and life-threatening complications (estimated to be 1 in 10,000)], benefits (risk stratification, diagnosing coronary artery  disease, treatment guidance) and alternatives of a nuclear stress test were discussed in detail with Ms. Marquess and she agrees to proceed.      Follow-up: Return if symptoms worsen or fail to improve.  Signed, Darryle DASEN. Barbaraann, MD, [facility]  [facility]  [address] [place], [place] [address] [phone]  5:10 PM

## 2024-05-20 NOTE — Telephone Encounter (Signed)
 Pt is establishing with Dr. Barbaraann tomorrow. I will defer to him for preop evaluation. I will remove from the preop pool.

## 2024-05-20 NOTE — Telephone Encounter (Signed)
   Pre-operative Risk Assessment    Patient Name: Lindsay Porter  DOB: 21-Mar-1941 MRN: [id number]   Date of last office visit: NONE Date of next office visit: 05/21/24 DR. O'NEAL NEW PT APPT    Request for Surgical Clearance    Procedure:  RIGHT REVERSE TOTAL SHOULDER ARTHROPLASTY  Date of Surgery:  Clearance TBD                                Surgeon:  DR. ELSPETH HER  Surgeon's Group or Practice Name:  [facility] Phone number:  [phone] MEGAN DAVIS Fax number:  [phone]   Type of Clearance Requested:   - Medical    Type of Anesthesia:  CHOICE   Additional requests/questions:    Bonney Niels Jest   05/20/2024, 11:06 AM

## 2024-05-21 ENCOUNTER — Ambulatory Visit: Attending: Cardiovascular Disease | Admitting: Cardiovascular Disease

## 2024-05-21 ENCOUNTER — Encounter: Payer: Self-pay | Admitting: Cardiovascular Disease

## 2024-05-21 ENCOUNTER — Ambulatory Visit: Admitting: Pulmonary Disease

## 2024-05-21 ENCOUNTER — Encounter: Payer: Self-pay | Admitting: Pulmonary Disease

## 2024-05-21 VITALS — BP 126/60 | HR 79 | Ht 65.0 in | Wt 180.0 lb

## 2024-05-21 VITALS — BP 118/76 | HR 87 | Ht 66.0 in | Wt 180.0 lb

## 2024-05-21 DIAGNOSIS — J449 Chronic obstructive pulmonary disease, unspecified: Secondary | ICD-10-CM

## 2024-05-21 DIAGNOSIS — I2510 Atherosclerotic heart disease of native coronary artery without angina pectoris: Secondary | ICD-10-CM

## 2024-05-21 DIAGNOSIS — Z01810 Encounter for preprocedural cardiovascular examination: Secondary | ICD-10-CM | POA: Diagnosis not present

## 2024-05-21 DIAGNOSIS — I2721 Secondary pulmonary arterial hypertension: Secondary | ICD-10-CM | POA: Diagnosis not present

## 2024-05-21 DIAGNOSIS — R911 Solitary pulmonary nodule: Secondary | ICD-10-CM

## 2024-05-21 DIAGNOSIS — E782 Mixed hyperlipidemia: Secondary | ICD-10-CM | POA: Diagnosis not present

## 2024-05-21 DIAGNOSIS — R0602 Shortness of breath: Secondary | ICD-10-CM | POA: Diagnosis not present

## 2024-05-21 MED ORDER — ALBUTEROL SULFATE HFA 108 (90 BASE) MCG/ACT IN AERS: 2.0000 | INHALATION_SPRAY | Freq: Four times a day (QID) | RESPIRATORY_TRACT | 5 refills | Status: AC | PRN

## 2024-05-21 NOTE — Patient Instructions (Addendum)
 Medication Instructions:  Your physician recommends that you continue on your current medications as directed. Please refer to the Current Medication list given to you today.  *If you need a refill on your cardiac medications before your next appointment, please call your pharmacy*  Lab Work: NONE ordered at this time of appointment   Testing/Procedures: Your physician has requested that you have a lexiscan myoview. For further information please visit [email or web address] Please follow instruction sheet, as given.   Calcium Scoring Test  Follow-Up: At [facility], you and your health needs are our priority.  As part of our continuing mission to provide you with exceptional heart care, our providers are all part of one team.  This team includes your primary Cardiologist (physician) and Advanced Practice Providers or APPs (Physician Assistants and Nurse Practitioners) who all work together to provide you with the care you need, when you need it.  Your next appointment:    As needed  Provider:   Dr. Barbaraann   We recommend signing up for the patient portal called MyChart.  Sign up information is provided on this After Visit Summary.  MyChart is used to connect with patients for Virtual Visits (Telemedicine).  Patients are able to view lab/test results, encounter notes, upcoming appointments, etc.  Non-urgent messages can be sent to your provider as well.   To learn more about what you can do with MyChart, go to [email or web address].   Other Instructions How to Prepare for Your Myoview Test (stress test):  1.Please do not take these medications before your test:  (please note if this is an exercise test pt should hold beta blocker prior) 2.Your remaining medications may be taken with water. 3.Nothing to eat or drink, except water, 4 hours prior to arrival time.  NO caffeine/decaffeinated products, or chocolate 12 hours prior to arrival. 4.Ladies, please do not wear dresses.   Skirts or pants are approprate, please wear a short sleeve shirt. 5.NO perfume, cologne or lotion 6.Wear comfortable walking shoes.  NO HEELS! 7.Total time is 3 to 4 hours; you may want to bring reading material for the waiting time. 8.Please report to [facility] for your test  What to expect after you arrive:  Once you arrive and check in for your appointment an IV will be started in your arm.  Then the Technoligist will inject a small amount of radioactive tracer.  There will be a 1 hour waiting period after this injection.  A series of pictures will be taken of your heart following this waiting period.  You will be prepped for the stress portion of the test.  During the stress portion of your test you will either walk on a treadmill or receive a small, safe amount of radioactive tracer injected in your IV.  After the stress portion, there is a short rest period during which time your heart and blood pressure will be monitored.  After the short rest period the Technologist will begin your second set of pictures.  Your doctor will inform you of your test results within 7-10 business days.  In preparation for your appointment, medication and supplies will be purchased.  Appointment availability is limited, so if you need to cancel or reschedule please call the office at [phone] 24 hours in advance to avoid a cancellation fee of $100.00  IF YOU THINK YOU MAY BE PREGNANT, PLEASE INFORM THE TECHNOLOGIST.    Coronary Calcium Scan A coronary calcium scan is an imaging test used to look for deposits of  plaque in the inner lining of the blood vessels of the heart (coronary arteries). Plaque is made up of calcium, protein, and fatty substances. These deposits of plaque can partly clog and narrow the coronary arteries without producing any symptoms or warning signs. This puts a person at risk for a heart attack. A coronary calcium scan is performed using a computed tomography (CT) scanner machine  without using a dye (contrast). This test is recommended for people who are at moderate risk for heart disease. The test can find plaque deposits before symptoms develop. Tell a health care provider about: Any allergies you have. All medicines you are taking, including vitamins, herbs, eye drops, creams, and over-the-counter medicines. Any problems you or family members have had with anesthetic medicines. Any bleeding problems you have. Any surgeries you have had. Any medical conditions you have. Whether you are pregnant or may be pregnant. What are the risks? Generally, this is a safe procedure. However, problems may occur, including: Harm to a pregnant woman and her unborn baby. This test involves the use of radiation. Radiation exposure can be dangerous to a pregnant woman and her unborn baby. If you are pregnant or think you may be pregnant, you should not have this procedure done. A slight increase in the risk of cancer. This is because of the radiation involved in the test. The amount of radiation from one test is similar to the amount of radiation you are naturally exposed to over one year. What happens before the procedure? Ask your health care provider for any specific instructions on how to prepare for this procedure. You may be asked to avoid products that contain caffeine, tobacco, or nicotine for 4 hours before the procedure. What happens during the procedure?  You will undress and remove any jewelry from your neck or chest. You may need to remove hearing aides and dentures. Women may need to remove their bras. You will put on a hospital gown. Sticky electrodes will be placed on your chest. The electrodes will be connected to an electrocardiogram (ECG) machine to record a tracing of the electrical activity of your heart. You will lie down on your back on a curved bed that is attached to the CT scanner. You may be given medicine to slow down your heart rate so that clear pictures  can be created. You will be moved into the CT scanner, and the CT scanner will take pictures of your heart. During this time, you will be asked to lie still and hold your breath for 10-20 seconds at a time while each picture of your heart is being taken. The procedure may vary among health care providers and hospitals. What can I expect after the procedure? You can return to your normal activities. It is up to you to get the results of your procedure. Ask your health care provider, or the department that is doing the procedure, when your results will be ready. Summary A coronary calcium scan is an imaging test used to look for deposits of plaque in the inner lining of the blood vessels of the heart. Plaque is made up of calcium, protein, and fatty substances. A coronary calcium scan is performed using a CT scanner machine without contrast. Generally, this is a safe procedure. Tell your health care provider if you are pregnant or may be pregnant. Ask your health care provider for any specific instructions on how to prepare for this procedure. You can return to your normal activities after the scan is done.  This information is not intended to replace advice given to you by your health care provider. Make sure you discuss any questions you have with your health care provider. Document Revised: 05/29/2021 Document Reviewed: 05/29/2021 Elsevier Patient Education  2024 [employer].

## 2024-05-21 NOTE — Patient Instructions (Addendum)
 For your upcoming Surgery: ARISCAT Score for Postoperative Pulmonary Complications Low risk 1.6% risk of in-hospital post-op pulmonary complications (composite including respiratory failure, respiratory infection, pleural effusion, atelectasis, pneumothorax, bronchospasm, aspiration pneumonitis)  Continue trelegy ellipta  1 puff daily - rinse mouth out after each use  Use albuterol  inhaler 1-2 puffs every 4-6 hours as needed  Follow up in 6 months, call sooner if needed

## 2024-05-21 NOTE — Progress Notes (Signed)
 Established Patient Pulmonology Office Visit   Subjective:  Patient ID: Lindsay Porter, female    DOB: [birth year]  MRN: [id number]  CC:  Chief Complaint  Patient presents with   Medical Management of Chronic Issues    Pt states Surgical clearance     Discussed the use of AI scribe software for clinical note transcription with the patient, who gave verbal consent to proceed.  History of Present Illness Lindsay Porter is an 83 year old female with COPD who presents for follow-up.  She transitioned to Trelegy Ellipta  one puff daily after a COPD exacerbation in July but often forgets to use it, taking it approximately four days a week. She cannot determine its effectiveness but assumes it helps prevent worsening. She experiences breathiness when walking up her driveway, which resolves quickly with rest. There have been no recent respiratory infections.  She finds nasal spray very helpful and frequently uses cough drops. She has an albuterol  inhaler but rarely uses it. Her oxygen levels are typically around 92-93%, with a noted level of 98% this morning.        ROS    Current Outpatient Medications:    cyanocobalamin  (VITAMIN B12) 1000 MCG tablet, Take 1,000 mcg by mouth daily., Disp: , Rfl:    ferrous sulfate  325 (65 FE) MG tablet, Take 325 mg by mouth daily with breakfast., Disp: , Rfl:    fluticasone  (FLONASE ) 50 MCG/ACT nasal spray, SHAKE LIQUID AND USE 2 SPRAYS IN EACH NOSTRIL DAILY, Disp: 16 g, Rfl: 2   Fluticasone -Umeclidin-Vilant (TRELEGY ELLIPTA ) 100-62.5-25 MCG/ACT AEPB, Inhale 1 puff into the lungs daily., Disp: 28 each, Rfl: 5   gabapentin  (NEURONTIN ) 300 MG capsule, Take 600 mg by mouth at bedtime., Disp: , Rfl:    loratadine (CLARITIN) 10 MG tablet, Take 20 mg by mouth every evening., Disp: , Rfl:    meloxicam  (MOBIC ) 15 MG tablet, Take 15 mg by mouth daily., Disp: , Rfl:    Multiple Vitamins-Minerals (MULTIVITAMIN WOMEN) TABS, Take 1 tablet by mouth daily  with breakfast., Disp: , Rfl:    pantoprazole  (PROTONIX ) 40 MG tablet, Take 1 tablet (40 mg total) by mouth 2 (two) times daily. (Patient taking differently: Take 40 mg by mouth 2 (two) times daily as needed (for reflux- 30 minutes prior to food).), Disp: 60 tablet, Rfl: 11   pramipexole  (MIRAPEX ) 1 MG tablet, Take 1 mg by mouth at bedtime., Disp: , Rfl:    sertraline  (ZOLOFT ) 50 MG tablet, Take 50 mg by mouth at bedtime., Disp: , Rfl:    torsemide  (DEMADEX ) 100 MG tablet, Take 1 tablet (100 mg total) by mouth daily as needed., Disp: 30 tablet, Rfl: 0   traMADol  (ULTRAM ) 50 MG tablet, Take 1 tablet (50 mg total) by mouth every 6 (six) hours as needed for moderate pain or severe pain., Disp: 30 tablet, Rfl: 0   albuterol  (VENTOLIN  HFA) 108 (90 Base) MCG/ACT inhaler, Inhale 2 puffs into the lungs every 6 (six) hours as needed for wheezing or shortness of breath., Disp: 8.5 g, Rfl: 5      Objective:  BP 118/76   Pulse 87   Ht 5' 6 (1.676 m) Comment: per pt  Wt 180 lb (81.6 kg)   SpO2 95%   BMI 29.05 kg/m     Physical Exam Constitutional:      General: She is not in acute distress.    Appearance: Normal appearance. She is obese.  Eyes:     General: No scleral  icterus.    Conjunctiva/sclera: Conjunctivae normal.  Cardiovascular:     Rate and Rhythm: Normal rate and regular rhythm.  Pulmonary:     Breath sounds: No wheezing, rhonchi or rales.  Musculoskeletal:     Right lower leg: No edema.     Left lower leg: No edema.  Skin:    General: Skin is warm and dry.  Neurological:     General: No focal deficit present.      Diagnostic Review:   [facility] CT Chest 02/07/24 1. Lung-RADS 2, benign appearance or behavior. Continue annual screening with low-dose chest CT without contrast in 12 months. 2. Two-vessel coronary atherosclerosis. 3. Chronic moderate hiatal hernia. 4. Aortic Atherosclerosis (ICD10-I70.0) and Emphysema (ICD10-J43.9).  Echo 12/2023 LV EF 60-65%. RV systolic size  and function are normal. Mildly elevated pulmonary pressure. LA moderately dilated.    Assessment & Plan:   Assessment & Plan Chronic obstructive pulmonary disease, unspecified COPD type (HCC)  Orders:   albuterol  (VENTOLIN  HFA) 108 (90 Base) MCG/ACT inhaler; Inhale 2 puffs into the lungs every 6 (six) hours as needed for wheezing or shortness of breath.  Pulmonary artery hypertension (HCC)     Lung nodule      Assessment and Plan Assessment & Plan Chronic obstructive pulmonary disease (COPD) COPD managed with Trelegy Ellipta , intermittent use noted. Occasional exertional dyspnea, oxygen saturation stable. Low risk for postoperative respiratory complications. - Advised placing Trelegy inhaler by toothbrush to improve adherence. - Instructed to brush and rinse mouth after each use of Trelegy. - Ensured adequate refills for albuterol  inhaler.  Solitary pulmonary nodule, stable post-lung cancer screening Solitary pulmonary nodule classified as Lung-RADS 2, low suspicion for malignancy. - follow up in May 2026  Pre-Operative Pulmonary Evaluation ARISCAT Score for Postoperative Pulmonary Complications  Low risk 1.6% risk of in-hospital post-op pulmonary complications (composite including respiratory failure, respiratory infection, pleural effusion, atelectasis, pneumothorax, bronchospasm, aspiration pneumonitis)  Return in about 6 months (around 11/18/2024) for f/u visit Dr. Kara.   Dorn KATHEE Kara, MD

## 2024-05-21 NOTE — Assessment & Plan Note (Addendum)
  Orders:   albuterol  (VENTOLIN  HFA) 108 (90 Base) MCG/ACT inhaler; Inhale 2 puffs into the lungs every 6 (six) hours as needed for wheezing or shortness of breath.

## 2024-05-22 ENCOUNTER — Other Ambulatory Visit: Payer: Self-pay | Admitting: Cardiovascular Disease

## 2024-05-22 ENCOUNTER — Ambulatory Visit (HOSPITAL_COMMUNITY)
Admission: RE | Admit: 2024-05-22 | Discharge: 2024-05-22 | Disposition: A | Discharge status: Home / Self Care | Payer: Self-pay | Source: Ambulatory Visit | Attending: Cardiovascular Disease | Admitting: Cardiovascular Disease

## 2024-05-22 ENCOUNTER — Telehealth (HOSPITAL_COMMUNITY): Payer: Self-pay | Admitting: *Deleted

## 2024-05-22 DIAGNOSIS — I2510 Atherosclerotic heart disease of native coronary artery without angina pectoris: Secondary | ICD-10-CM | POA: Insufficient documentation

## 2024-05-22 DIAGNOSIS — R0602 Shortness of breath: Secondary | ICD-10-CM

## 2024-05-22 NOTE — Telephone Encounter (Signed)
 Patient given detailed instructions per Myocardial Perfusion Study Information Sheet for the test on 05/26/2024 at 7:30. Patient notified to arrive 15 minutes early and that it is imperative to arrive on time for appointment to keep from having the test rescheduled.  If you need to cancel or reschedule your appointment, please call the office within 24 hours of your appointment. . Patient verbalized understanding.Lindsay Porter

## 2024-05-25 ENCOUNTER — Ambulatory Visit: Payer: Self-pay | Admitting: Cardiovascular Disease

## 2024-05-26 ENCOUNTER — Ambulatory Visit (HOSPITAL_COMMUNITY)
Admission: RE | Admit: 2024-05-26 | Discharge: 2024-05-26 | Disposition: A | Discharge status: Home / Self Care | Source: Ambulatory Visit | Attending: Cardiovascular Disease | Admitting: Cardiovascular Disease

## 2024-05-26 ENCOUNTER — Encounter: Payer: Self-pay | Admitting: *Deleted

## 2024-05-26 DIAGNOSIS — R0602 Shortness of breath: Secondary | ICD-10-CM | POA: Diagnosis not present

## 2024-05-26 LAB — MYOCARDIAL PERFUSION IMAGING
LV dias vol: 69 mL (ref 46–106)
LV sys vol: 12 mL (ref 3.8–5.2)
Nuc Stress EF: 85 %
Peak HR: 85 {beats}/min
Rest HR: 71 {beats}/min
Rest Nuclear Isotope Dose: 10.4 mCi
SDS: 0
SRS: 0
SSS: 0
ST Depression (mm): 0 mm
Stress Nuclear Isotope Dose: 32.3 mCi
TID: 0.89

## 2024-05-26 MED ORDER — REGADENOSON 0.4 MG/5ML IV SOLN
INTRAVENOUS | Status: AC
  Filled 2024-05-26: qty 5

## 2024-05-26 MED ORDER — TECHNETIUM TC 99M TETROFOSMIN IV KIT
32.3000 | PACK | Freq: Once | INTRAVENOUS | Status: AC | PRN
  Administered 2024-05-26: 32.3 via INTRAVENOUS

## 2024-05-26 MED ORDER — REGADENOSON 0.4 MG/5ML IV SOLN
0.4000 mg | Freq: Once | INTRAVENOUS | Status: AC
  Administered 2024-05-26: 0.4 mg via INTRAVENOUS

## 2024-05-26 MED ORDER — TECHNETIUM TC 99M TETROFOSMIN IV KIT
10.4000 | PACK | Freq: Once | INTRAVENOUS | Status: AC | PRN
  Administered 2024-05-26: 10.4 via INTRAVENOUS

## 2024-05-27 ENCOUNTER — Encounter: Payer: Self-pay | Admitting: Emergency Medicine

## 2024-05-27 ENCOUNTER — Ambulatory Visit: Admitting: Internal Medicine

## 2024-05-27 ENCOUNTER — Ambulatory Visit: Attending: Emergency Medicine | Admitting: Emergency Medicine

## 2024-05-27 VITALS — BP 104/62 | HR 76 | Ht 65.0 in | Wt 182.0 lb

## 2024-05-27 DIAGNOSIS — Z01810 Encounter for preprocedural cardiovascular examination: Secondary | ICD-10-CM

## 2024-05-27 DIAGNOSIS — J449 Chronic obstructive pulmonary disease, unspecified: Secondary | ICD-10-CM

## 2024-05-27 DIAGNOSIS — E785 Hyperlipidemia, unspecified: Secondary | ICD-10-CM

## 2024-05-27 DIAGNOSIS — I2510 Atherosclerotic heart disease of native coronary artery without angina pectoris: Secondary | ICD-10-CM | POA: Diagnosis not present

## 2024-05-27 DIAGNOSIS — Z7689 Persons encountering health services in other specified circumstances: Secondary | ICD-10-CM

## 2024-05-27 DIAGNOSIS — R600 Localized edema: Secondary | ICD-10-CM

## 2024-05-27 DIAGNOSIS — Z79899 Other long term (current) drug therapy: Secondary | ICD-10-CM

## 2024-05-27 MED ORDER — FUROSEMIDE 40 MG PO TABS: 40.0000 mg | ORAL_TABLET | Freq: Every day | ORAL | 1 refills | Status: DC

## 2024-05-27 MED ORDER — POTASSIUM CHLORIDE CRYS ER 20 MEQ PO TBCR: 20.0000 meq | EXTENDED_RELEASE_TABLET | Freq: Every day | ORAL | 1 refills | Status: AC

## 2024-05-27 NOTE — Progress Notes (Signed)
 Cardiology Office Note:    Date:  05/27/2024  ID:  Lindsay Porter, DOB 29-Sep-1940, MRN [id number] PCP: Mathew Bouchard, DO  [facility] Providers Cardiologist:  Darryle ONEIDA Decent, MD Cardiology APP:  Rana Lum CROME, NP       Patient Profile:       Chief Complaint: Acute visit for lower extremity swelling History of Present Illness:  Lindsay Porter is a 83 y.o. female with visit-pertinent history of COPD, coronary calcifications  Her history includes COPD and she experiences shortness of breath which led to a hospital visit in June 2025 for COPD exacerbation.  Echocardiogram at that time showed LVEF 60 to 65%, no RWMA, indeterminate diastolic parameters, RV function and size normal, mildly elevated PASP, left atrial size is mild to moderately dilated, no valvular abnormalities.  Patient established with Dr. Decent on 05/21/2024 for preoperative assessment.  She was preparing for shoulder surgery and require cardiac clearance.  She was experiencing pain in her shoulder with radiation to her arms, collarbones, and upper back.  She reports swelling in her left leg from mid shin to the ankle but not in the right leg.  She underwent CT cardiac scoring on 05/22/2024 showing coronary calcium score 313 (77th percentile).  She underwent Lexiscan  Myoview  on 05/26/2024 which was normal with no ischemia or infarction.  Coronary calcifications were present in the left anterior descending artery and extensive aortic atherosclerosis.   Discussed the use of AI scribe software for clinical note transcription with the patient, who gave verbal consent to proceed.  History of Present Illness Lindsay Porter is an 83 year old female with coronary artery disease and COPD who presents with new onset lower extremity swelling and weight gain.   Over the past three to four weeks she has had acute swelling in her arms and legs. It started with a red, inflamed rash on her left arm, followed  by swelling that then involved her left leg and, over the last four days, her right leg. She has never had swelling before.  She has also noted a steady weight gain.  She denies chest pain or new cardiac symptoms.  No changes to her chronic shortness of breath.  She denies any orthopnea or PND.  Denies any calf pain.  She notes difficulty losing weight, reduced appetite with some nighttime eating, and recent weight gain of 2 pounds over the last week.  She has been taking prednisone  for shoulder pain but recently stopped and recently started tramadol  for shoulder pain.  She denies dizziness, lightheadedness, syncope, presyncope, melena, hematochezia   Review of systems:  Please see the history of present illness. All other systems are reviewed and otherwise negative.      Studies Reviewed:        Lexiscan  Myoview  05/26/2024   The study is normal. Findings are consistent with no ischemia and no infarction. The study is low risk.   No ST deviation was noted.   Left ventricular function is normal. Nuclear stress EF: 85%. The left ventricular ejection fraction is hyperdynamic (>65%). End diastolic cavity size is normal. End systolic cavity size is normal.   CT images were obtained for attenuation correction and were examined for the presence of coronary calcium when appropriate.   Coronary calcium was present on the attenuation correction CT images. Mild coronary calcifications were present. Coronary calcifications were present in the left anterior descending artery distribution(s).  Extensive aortic atherosclerosis.  Aortic valve calcification is noted.   Prior study  not available for comparison.  CT cardiac scoring 05/22/2024 Coronary calcium score of 313. This was 30 percentile for age-, race-, and sex-matched controls.  Echocardiogram 12/26/2023  1. Left ventricular ejection fraction, by estimation, is 60 to 65%. The  left ventricle has normal function. The left ventricle has no  regional  wall motion abnormalities. Left ventricular diastolic parameters are  indeterminate. The average left  ventricular global longitudinal strain is -20.9 %. The global longitudinal  strain is normal.   2. Right ventricular systolic function is normal. The right ventricular  size is normal. There is mildly elevated pulmonary artery systolic  pressure. The estimated right ventricular systolic pressure is 36.9 mmHg.   3. Left atrial size was mild to moderately dilated.   4. The mitral valve is normal in structure. No evidence of mitral valve  regurgitation. No evidence of mitral stenosis.   5. The aortic valve is normal in structure. Aortic valve regurgitation is  trivial. No aortic stenosis is present.   6. The inferior vena cava is dilated in size with >50% respiratory  variability, suggesting right atrial pressure of 8 mmHg.   Risk Assessment/Calculations:              Physical Exam:   VS:  BP 104/62 (BP Location: Right Arm, Patient Position: Sitting, Cuff Size: Normal)   Pulse 76   Ht 5' 5 (1.651 m)   Wt 182 lb (82.6 kg)   BMI 30.29 kg/m    Wt Readings from Last 3 Encounters:  05/27/24 182 lb (82.6 kg)  05/21/24 180 lb (81.6 kg)  05/21/24 180 lb (81.6 kg)    GEN: Well nourished, well developed in no acute distress NECK: No JVD; No carotid bruits CARDIAC: RRR, no murmurs, rubs, gallops RESPIRATORY:  Clear to auscultation without rales, wheezing or rhonchi  ABDOMEN: Soft, non-tender, non-distended EXTREMITIES: 2+ bilateral LEE; No acute deformity      Assessment and Plan:  Coronary artery disease CT cardiac scoring 11/20 with CAC of 313 (77th percentile) Lexiscan  Myoview  11/24 was normal without ischemia or infarction - Today she is stable without chest pains.  She denies exertional symptoms.  No symptoms to suggest active angina.  No indication for ischemic evaluation at this time - We discussed initiation of statin therapy with Crestor and starting on  antiplatelet therapy with aspirin, however patient would like to think about this and further discuss at follow-up visit and after her upcoming surgery which is understandable  Hyperlipidemia LDL 106 on 08/2023 and not at goal - We discussed initiation of statin therapy with Crestor however patient would like to think about this and further discuss at follow-up visit after her surgery  - Direct LDL today  COPD No recent exacerbations - Managed per pulmonology  Lower extremity swelling Noted over the past 4 weeks she has had acute swelling that started with a rash on her left arm which progressed to swelling to her left leg and now over the past 4 days her right leg with associated weight gain - Her echo on 12/2023 showed normal LVEF, no RWMA and indeterminate diastolic parameters - Lexiscan  Myoview  05/2024 was without ischemia or infarction but noted to have hyperdynamic EF of 85% - No changes to chronic dyspnea.  No orthopnea, PND.  On exam 2+ LEE bilaterally and lungs CTAB.  No recent or new medications - Plan to start furosemide  40 mg daily with potassium chloride  20 mEq - Plan to repeat echocardiogram to reassess her LV function - BNP,  CMET, CBC, TSH today.  Will rule out secondary causes of HOHF - I will also ask her to follow-up with her PCP  Preoperative cardiovascular exam Recent stress test was without ischemia or infarction and low risk  Based on ACC/AHA guidelines, patient would be at acceptable risk for the planned procedure without further cardiovascular testing. I will route this recommendation to the requesting party via Epic fax function.       Dispo:  Return in about 6 weeks (around 07/08/2024).  Signed, Lum LITTIE Louis, NP

## 2024-05-27 NOTE — Patient Instructions (Addendum)
 Medication Instructions:  START TAKING LASIX  40 MG DAILY. START TAKING POTASSIUM CHLORIDE  20 MEW DAILY.  Lab Work: CBC, CMET, DIRECT LDL, AND BNP TO BE DONE TODAY.  Testing/Procedures: Your physician has requested that you have an echocardiogram. Echocardiography is a painless test that uses sound waves to create images of your heart. It provides your doctor with information about the size and shape of your heart and how well your heart's chambers and valves are working. This procedure takes approximately one hour. There are no restrictions for this procedure. Please do NOT wear cologne, perfume, aftershave, or lotions (deodorant is allowed). Please arrive 15 minutes prior to your appointment time.  Please note: We ask at that you not bring children with you during ultrasound (echo/ vascular) testing. Due to room size and safety concerns, children are not allowed in the ultrasound rooms during exams. Our front office staff cannot provide observation of children in our lobby area while testing is being conducted. An adult accompanying a patient to their appointment will only be allowed in the ultrasound room at the discretion of the ultrasound technician under special circumstances. We apologize for any inconvenience.   Follow-Up: At [facility], you and your health needs are our priority.  As part of our continuing mission to provide you with exceptional heart care, our providers are all part of one team.  This team includes your primary Cardiologist (physician) and Advanced Practice Providers or APPs (Physician Assistants and Nurse Practitioners) who all work together to provide you with the care you need, when you need it.  Your next appointment:   6 WEEKS  Provider:   MADISON FOUNTAIN, NP

## 2024-05-29 LAB — COMPREHENSIVE METABOLIC PANEL WITH GFR
ALT: 13 IU/L (ref 0–32)
AST: 18 IU/L (ref 0–40)
Albumin: 4.6 g/dL (ref 3.7–4.7)
Alkaline Phosphatase: 74 IU/L (ref 48–129)
BUN/Creatinine Ratio: 26 (ref 12–28)
BUN: 15 mg/dL (ref 8–27)
Bilirubin Total: 0.4 mg/dL (ref 0.0–1.2)
CO2: 27 mmol/L (ref 20–29)
Calcium: 9.5 mg/dL (ref 8.7–10.3)
Chloride: 99 mmol/L (ref 96–106)
Creatinine, Ser: 0.58 mg/dL (ref 0.57–1.00)
Globulin, Total: 1.8 g/dL (ref 1.5–4.5)
Glucose: 79 mg/dL (ref 70–99)
Potassium: 4.8 mmol/L (ref 3.5–5.2)
Sodium: 139 mmol/L (ref 134–144)
Total Protein: 6.4 g/dL (ref 6.0–8.5)
eGFR: 91 mL/min/1.73

## 2024-05-29 LAB — CBC
Hematocrit: 35.8 % (ref 34.0–46.6)
Hemoglobin: 11.5 g/dL (ref 11.1–15.9)
MCH: 31.3 pg (ref 26.6–33.0)
MCHC: 32.1 g/dL (ref 31.5–35.7)
MCV: 98 fL — ABNORMAL HIGH (ref 79–97)
Platelets: 301 x10E3/uL (ref 150–450)
RBC: 3.67 x10E6/uL — ABNORMAL LOW (ref 3.77–5.28)
RDW: 13.2 % (ref 11.7–15.4)
WBC: 6.7 x10E3/uL (ref 3.4–10.8)

## 2024-05-29 LAB — BRAIN NATRIURETIC PEPTIDE: BNP: 114.1 pg/mL — AB (ref 0.0–100.0)

## 2024-05-29 LAB — LDL CHOLESTEROL, DIRECT: LDL Direct: 102 mg/dL — ABNORMAL HIGH (ref 0–99)

## 2024-06-02 ENCOUNTER — Ambulatory Visit: Payer: Self-pay | Admitting: Emergency Medicine

## 2024-06-02 DIAGNOSIS — Z79899 Other long term (current) drug therapy: Secondary | ICD-10-CM

## 2024-06-09 ENCOUNTER — Telehealth (HOSPITAL_BASED_OUTPATIENT_CLINIC_OR_DEPARTMENT_OTHER): Payer: Self-pay

## 2024-06-09 NOTE — Telephone Encounter (Signed)
 Per preop APP Orren Fabry, PAC to fax notes from Lum Louis, NP providing clearance.

## 2024-06-09 NOTE — Telephone Encounter (Signed)
 I will forward this to preop APP to review. I do see the pt has an echo to be done on 07/08/2024 and then has a f/u on 08/01/24 Lum Louis, NP.

## 2024-06-09 NOTE — Telephone Encounter (Signed)
   Name: Lindsay Porter  DOB: [birth year]  MRN: [id number]  Primary Cardiologist: Darryle ONEIDA Decent, MD  Chart reviewed as part of pre-operative protocol coverage. Because of Lindsay Porter's past medical history and time since last visit, she will require a follow-up in-office visit in order to better assess preoperative cardiovascular risk.  Pre-op covering staff: - Please schedule appointment and call patient to inform them. If patient already had an upcoming appointment within acceptable timeframe, please add pre-op clearance to the appointment notes so provider is aware. - Please contact requesting surgeon's office via preferred method (i.e, phone, fax) to inform them of need for appointment prior to surgery.  No medications indicated as needing held.  Orren LOISE Fabry, PA-C  06/09/2024, 3:35 PM

## 2024-06-09 NOTE — Telephone Encounter (Signed)
   Pre-operative Risk Assessment    Patient Name: Lindsay Porter  DOB: 1941-07-02 MRN: [id number]   Date of last office visit: 05/27/24 with Rana Date of next office visit: 08/01/24 with [facility]  Request for Surgical Clearance    Procedure:  Right Reverse Total Shoulder Arthroplasty   Date of Surgery:  Clearance TBD                                 Surgeon:  Dr. Kay Socks Group or Practice Name:  [facility] Phone number:  [phone] Fax number:  [phone]   Type of Clearance Requested:   - Medical    Type of Anesthesia:  choice   Additional requests/questions:    Bonney Augustin JONETTA Delores   06/09/2024, 2:44 PM

## 2024-06-09 NOTE — Telephone Encounter (Signed)
 Patient is following up. She thinks what Lum Louis told her conflicts with pre op advising clearance to be addressed during in-office appointment, 1/30. She would like a call back to clarify.

## 2024-06-17 NOTE — Telephone Encounter (Signed)
 I will update the preop APP with notes I received today from triage RN. See notes in regard to echo for preop.    Elkins, Jenna M, RN to [facility] Preop Callback  Janet Planas B (Selected Message)     06/17/24  8:41 AM Pre-op, wasn't sure if the notes from [facility] NP were faxed. Planas, any sooner echo appts? Loring Andriette HERO, RN to [facility] Mcguiness     06/17/24  8:40 AM Regarding labs, see message below from [place] NP. You are due for labs this week   Rana Lum CROME, NP     06/02/24  1:37 PM Result Note Lab work shows normal hemoglobin and hematocrit.  LDL (bad cholesterol) is elevated at 102 with a goal of less than 70.  We can discuss cholesterol-lowering therapy at upcoming visit.  Metabolic panel shows normal glucose, kidney function, liver function, and electrolytes.  BNP (fluid lab) is slightly elevated at 114.  The Lasix  should help improve your swelling.  Overall reassuring results.  Repeat BMET x 2 weeks as she was recently started on Lasix  to monitor her kidney function and electrolytes.     Regarding clearance: will check with our team. If anesthesia is requiring the echo first, we may just have to wait on that. I am not sure if there is anything sooner, but can have our scheduler reach out if so.

## 2024-06-17 NOTE — Telephone Encounter (Signed)
 I s/w the pt who had questions as to why the echo was being ordered. I explained seems the anesthesiologist for her upcoming surgery is requesting the echo. Pt did ask if she could maybe have echo a bit sooner if possible, and if not no worries she will keep the plan as it is outlined at this time.    I assured the pt that I will send a message to [facility] admin echo dept about maybe sooner echo. I did tell the pt that the end of the yr not sure if the [facility] will have anything sooner but we can ask. Pt thanked me for the help and taking the time to explain all of this to her.

## 2024-06-17 NOTE — Telephone Encounter (Signed)
 UPDATE ON SURGERY DATE: I SEE IN EPIC THAT THERE IS NOW A SURGERY DATE OF 08/15/24 DR. STEVEN NORRIS.

## 2024-06-23 ENCOUNTER — Ambulatory Visit: Admitting: Pulmonary Disease

## 2024-06-30 NOTE — Telephone Encounter (Signed)
 06/23/24 note faxed to [facility]

## 2024-07-08 ENCOUNTER — Ambulatory Visit (HOSPITAL_COMMUNITY)
Admission: RE | Admit: 2024-07-08 | Discharge: 2024-07-08 | Disposition: A | Discharge status: Home / Self Care | Source: Ambulatory Visit | Attending: Cardiology | Admitting: Cardiology

## 2024-07-08 DIAGNOSIS — Z79899 Other long term (current) drug therapy: Secondary | ICD-10-CM | POA: Insufficient documentation

## 2024-07-08 DIAGNOSIS — R600 Localized edema: Secondary | ICD-10-CM | POA: Insufficient documentation

## 2024-07-08 LAB — ECHOCARDIOGRAM COMPLETE
Area-P 1/2: 3.31 cm2
S' Lateral: 2.2 cm

## 2024-07-11 NOTE — Telephone Encounter (Signed)
 Pt returning your call. Please advise

## 2024-07-23 LAB — BASIC METABOLIC PANEL WITH GFR
BUN/Creatinine Ratio: 34 — ABNORMAL HIGH (ref 12–28)
BUN: 20 mg/dL (ref 8–27)
CO2: 24 mmol/L (ref 20–29)
Calcium: 9.2 mg/dL (ref 8.7–10.3)
Chloride: 102 mmol/L (ref 96–106)
Creatinine, Ser: 0.59 mg/dL (ref 0.57–1.00)
Glucose: 102 mg/dL — ABNORMAL HIGH (ref 70–99)
Potassium: 4.5 mmol/L (ref 3.5–5.2)
Sodium: 142 mmol/L (ref 134–144)
eGFR: 90 mL/min/1.73

## 2024-07-23 NOTE — H&P (Signed)
 " Patient's anticipated LOS is less than 2 midnights, meeting these requirements: - Younger than 32 - Lives within 1 hour of care - Has a competent adult at home to recover with post-op recover - NO history of  - Chronic pain requiring opiods  - Diabetes  - Coronary Artery Disease  - Heart failure  - Heart attack  - Stroke  - DVT/VTE  - Cardiac arrhythmia  - Respiratory Failure/COPD  - Renal failure  - Anemia  - Advanced Liver disease     Lindsay Porter is an 84 y.o. female.    Chief Complaint: right shoulder pain  HPI: Pt is a 84 y.o. female complaining of right shoulder pain for multiple years. Pain had continually increased since the beginning. X-rays in the clinic show end-stage arthritic changes of the right shoulder. Pt has tried various conservative treatments which have failed to alleviate their symptoms, including injections and therapy. Various options are discussed with the patient. Risks, benefits and expectations were discussed with the patient. Patient understand the risks, benefits and expectations and wishes to proceed with surgery.   PCP:  Mathew Bouchard, DO  D/C Plans: Home  PMH: Past Medical History:  Diagnosis Date   Alcohol abuse    Allergy    Anemia, iron deficiency    Arthritis    Carpal tunnel syndrome    COPD (chronic obstructive pulmonary disease) (HCC)    Depression    Diverticulosis    Dyspnea    GERD (gastroesophageal reflux disease)    Hiatal hernia    Hyperlipidemia    Insomnia    Osteopenia    Ovarian cyst    Pneumonia 07/2022   Pre-diabetes    Restless leg syndrome    Seasonal allergies    Vitamin D deficiency    Wears contact lenses     PSH: Past Surgical History:  Procedure Laterality Date   ANKLE SURGERY  07/07/2009   left   APPENDECTOMY     BREAST LUMPECTOMY Left    BUNIONECTOMY  2006   left   CARPAL TUNNEL RELEASE Right    COLONOSCOPY     HIATAL HERNIA REPAIR  04/19/2012   Procedure: LAPAROSCOPIC REPAIR OF  HIATAL HERNIA;  Surgeon: Morene ONEIDA Olives, MD;  Location: [facility];  Service: General;  Laterality: N/A;   INGUINAL HERNIA REPAIR Right 06/04/2014   Procedure: HERNIA REPAIR INGUINAL ADULT;  Surgeon: Donnice Lima, MD;  Location: [facility];  Service: General;  Laterality: Right;   INGUINAL HERNIA REPAIR Left 10/11/2022   Procedure: LEFT HERNIA REPAIR INGUINAL ADULT WITH MESH;  Surgeon: Lima Donnice, MD;  Location: [facility];  Service: General;  Laterality: Left;  TAP BLOCK   INSERTION OF MESH Right 06/04/2014   Procedure: INSERTION OF MESH;  Surgeon: Donnice Lima, MD;  Location: [facility];  Service: General;  Laterality: Right;   INSERTION OF MESH Left 10/11/2022   Procedure: INSERTION OF MESH;  Surgeon: Lima Donnice, MD;  Location: [facility];  Service: General;  Laterality: Left;   KNEE ARTHROPLASTY Left    KNEE SURGERY  2005   lt   nose surgery  12/2010   SHOULDER SURGERY  1950   right   TONSILLECTOMY     UPPER GASTROINTESTINAL ENDOSCOPY     UPPER GI ENDOSCOPY      Social History:  reports that she quit smoking about 16 years ago. Her smoking use included cigarettes. She started smoking about 61 years ago. She has a 90  pack-year smoking history. She has never used smokeless tobacco. She reports current alcohol use of about 21.0 standard drinks of alcohol per week. She reports that she does not use drugs. BMI: Estimated body mass index is 30.29 kg/m as calculated from the following:   Height as of 05/27/24: 5' 5 (1.651 m).   Weight as of 05/27/24: 82.6 kg.  Lab Results  Component Value Date   ALBUMIN 4.6 05/27/2024   Diabetes: Patient does not have a diagnosis of diabetes.     Smoking Status:      Allergies:  Allergies[1]  Medications: No current facility-administered medications for this encounter.   Current Outpatient Medications  Medication Sig Dispense Refill   albuterol  (VENTOLIN  HFA) 108 (90 Base) MCG/ACT inhaler Inhale 2 puffs into  the lungs every 6 (six) hours as needed for wheezing or shortness of breath. 8.5 g 5   cyanocobalamin  (VITAMIN B12) 1000 MCG tablet Take 1,000 mcg by mouth daily.     ferrous sulfate  325 (65 FE) MG tablet Take 325 mg by mouth daily with breakfast.     fluticasone  (FLONASE ) 50 MCG/ACT nasal spray SHAKE LIQUID AND USE 2 SPRAYS IN EACH NOSTRIL DAILY 16 g 2   Fluticasone -Umeclidin-Vilant (TRELEGY ELLIPTA ) 100-62.5-25 MCG/ACT AEPB Inhale 1 puff into the lungs daily. 28 each 5   furosemide  (LASIX ) 40 MG tablet Take 1 tablet (40 mg total) by mouth daily. 90 tablet 1   gabapentin  (NEURONTIN ) 300 MG capsule Take 600 mg by mouth at bedtime.     loratadine (CLARITIN) 10 MG tablet Take 20 mg by mouth every evening.     meloxicam  (MOBIC ) 15 MG tablet Take 15 mg by mouth daily.     Multiple Vitamins-Minerals (MULTIVITAMIN WOMEN) TABS Take 1 tablet by mouth daily with breakfast.     pantoprazole  (PROTONIX ) 40 MG tablet Take 1 tablet (40 mg total) by mouth 2 (two) times daily. (Patient taking differently: Take 40 mg by mouth 2 (two) times daily as needed (for reflux- 30 minutes prior to food).) 60 tablet 11   potassium chloride  SA (KLOR-CON  M) 20 MEQ tablet Take 1 tablet (20 mEq total) by mouth daily. 90 tablet 1   pramipexole  (MIRAPEX ) 1 MG tablet Take 1 mg by mouth at bedtime.     sertraline  (ZOLOFT ) 50 MG tablet Take 50 mg by mouth at bedtime.     traMADol  (ULTRAM ) 50 MG tablet Take 1 tablet (50 mg total) by mouth every 6 (six) hours as needed for moderate pain or severe pain. 30 tablet 0    Results for orders placed or performed in visit on 06/02/24 (from the past 48 hours)  Basic metabolic panel with GFR     Status: Abnormal   Collection Time: 07/22/24  2:58 PM  Result Value Ref Range   Glucose 102 (H) 70 - 99 mg/dL   BUN 20 8 - 27 mg/dL   Creatinine, Ser 9.40 0.57 - 1.00 mg/dL   eGFR 90 >40 fO/fpw/8.26   BUN/Creatinine Ratio 34 (H) 12 - 28   Sodium 142 134 - 144 mmol/L   Potassium 4.5 3.5 - 5.2  mmol/L   Chloride 102 96 - 106 mmol/L   CO2 24 20 - 29 mmol/L   Calcium 9.2 8.7 - 10.3 mg/dL   No results found.  ROS: Pain with rom of the right upper extremity  Physical Exam: Alert and oriented 84 y.o. female in no acute distress Cranial nerves 2-12 intact Cervical spine: full rom with no tenderness, nv intact  distally Chest: active breath sounds bilaterally, no wheeze rhonchi or rales Heart: regular rate and rhythm, no murmur Abd: non tender non distended with active bowel sounds Hip is stable with rom  Right shoulder painful and weak rom Nv intact distally No rashes or edema  Assessment/Plan Assessment: right shoulder cuff arthropathy  Plan:  Patient will undergo a right reverse total shoulder by Dr. Kay at [place] Risks benefits and expectations were discussed with the patient. Patient understand risks, benefits and expectations and wishes to proceed. Preoperative templating of the joint replacement has been completed, documented, and submitted to the Operating Room personnel in order to optimize intra-operative equipment management.   Arvella Fireman PA-C, MPAS [facility] is now [employer] [address], [address], [place], [place] [address] Phone: [phone] [email or web address] Facebook  Instagram  LinkedIn  Twitter        [1]  Allergies Allergen Reactions   Singulair [Montelukast Sodium] Hives   Sulfa Antibiotics Other (See Comments)    Childhood reaction    "

## 2024-08-01 ENCOUNTER — Ambulatory Visit: Attending: Emergency Medicine | Admitting: Emergency Medicine

## 2024-08-01 ENCOUNTER — Other Ambulatory Visit: Payer: Self-pay | Admitting: Pulmonary Disease

## 2024-08-01 ENCOUNTER — Encounter: Payer: Self-pay | Admitting: Emergency Medicine

## 2024-08-01 VITALS — BP 100/78 | HR 72 | Ht 65.0 in | Wt 173.0 lb

## 2024-08-01 DIAGNOSIS — E785 Hyperlipidemia, unspecified: Secondary | ICD-10-CM | POA: Diagnosis not present

## 2024-08-01 DIAGNOSIS — I2510 Atherosclerotic heart disease of native coronary artery without angina pectoris: Secondary | ICD-10-CM | POA: Diagnosis not present

## 2024-08-01 DIAGNOSIS — J449 Chronic obstructive pulmonary disease, unspecified: Secondary | ICD-10-CM | POA: Diagnosis not present

## 2024-08-01 DIAGNOSIS — R600 Localized edema: Secondary | ICD-10-CM

## 2024-08-01 DIAGNOSIS — Z01810 Encounter for preprocedural cardiovascular examination: Secondary | ICD-10-CM | POA: Diagnosis not present

## 2024-08-01 DIAGNOSIS — I340 Nonrheumatic mitral (valve) insufficiency: Secondary | ICD-10-CM

## 2024-08-01 DIAGNOSIS — I351 Nonrheumatic aortic (valve) insufficiency: Secondary | ICD-10-CM

## 2024-08-01 MED ORDER — FUROSEMIDE 40 MG PO TABS: 40.0000 mg | ORAL_TABLET | ORAL | Status: AC | PRN

## 2024-08-01 NOTE — Progress Notes (Signed)
 " Cardiology Office Note:    Date:  08/01/2024  ID:  Lindsay Porter, DOB 13-Aug-1940, MRN [id number] PCP: Mathew Bouchard, DO  [facility] Providers Cardiologist:  Darryle ONEIDA Decent, MD Cardiology APP:  Rana Lum CROME, NP       Patient Profile:       Chief Complaint: 6-week follow-up History of Present Illness:  Lindsay Porter is a 84 y.o. female with visit-pertinent history of COPD, coronary calcifications, hyperlipidemia   Her history includes COPD and she experiences shortness of breath which led to a hospital visit in June 2025 for COPD exacerbation.  Echocardiogram at that time showed LVEF 60 to 65%, no RWMA, indeterminate diastolic parameters, RV function and size normal, mildly elevated PASP, left atrial size is mild to moderately dilated, no valvular abnormalities.   Patient established with Dr. Decent on 05/21/2024 for preoperative assessment.  She was preparing for shoulder surgery and require cardiac clearance.  She was experiencing pain in her shoulder with radiation to her arms, collarbones, and upper back.  She reports swelling in her left leg from mid shin to the ankle but not in the right leg.  She underwent CT cardiac scoring on 05/22/2024 showing coronary calcium score 313 (77th percentile).  She underwent Lexiscan  Myoview  on 05/26/2024 which was normal with no ischemia or infarction.  Coronary calcifications were present in the left anterior descending artery and extensive aortic atherosclerosis.  She was last seen in clinic on 05/27/2024.  She was stable without chest pains.  Statin therapy initiation was discussed however patient would like to think about this and further discuss at follow-up visit.  She had noted over the past 4 weeks she had acute swelling in her lower extremities that started with a rash on her left arm which progressed to swelling in her left leg and over the past 4 days her right leg.  She had no changes to her chronic dyspnea and was  without orthopnea or PND.  She was started on furosemide  40 mg daily.  Her BNP was elevated at 114.1.  She underwent echocardiogram 07/08/2024 showing LVEF 65 to 70%, no RWMA, indeterminate diastolic parameters, RV function is low normal, RV size mildly enlarged, normal PASP, left atrial size mild to moderately dilated, mild mitral valve regurgitation, mild aortic valve regurgitation.   Discussed the use of AI scribe software for clinical note transcription with the patient, who gave verbal consent to proceed.  Today patient is doing well without acute cardiovascular concerns or complaints.  She remains without chest pains or complaints of dyspnea.  No orthopnea or PND.  Lower extremity edema has resolved since starting furosemide .  She also states that her prior rash has entirely resolved.  She is not active due to her shoulder pains.  She has been without any syncope, presyncope, lightheadedness or dizziness.   Review of systems:  Please see the history of present illness. All other systems are reviewed and otherwise negative.      Studies Reviewed:        Lexiscan  Myoview  05/26/2024   The study is normal. Findings are consistent with no ischemia and no infarction. The study is low risk.   No ST deviation was noted.   Left ventricular function is normal. Nuclear stress EF: 85%. The left ventricular ejection fraction is hyperdynamic (>65%). End diastolic cavity size is normal. End systolic cavity size is normal.   CT images were obtained for attenuation correction and were examined for the presence of coronary calcium  when appropriate.   Coronary calcium was present on the attenuation correction CT images. Mild coronary calcifications were present. Coronary calcifications were present in the left anterior descending artery distribution(s).  Extensive aortic atherosclerosis.  Aortic valve calcification is noted.   Prior study not available for comparison.   CT cardiac scoring  05/22/2024 Coronary calcium score of 313. This was 32 percentile for age-, race-, and sex-matched controls.   Echocardiogram 12/26/2023  1. Left ventricular ejection fraction, by estimation, is 60 to 65%. The  left ventricle has normal function. The left ventricle has no regional  wall motion abnormalities. Left ventricular diastolic parameters are  indeterminate. The average left  ventricular global longitudinal strain is -20.9 %. The global longitudinal  strain is normal.   2. Right ventricular systolic function is normal. The right ventricular  size is normal. There is mildly elevated pulmonary artery systolic  pressure. The estimated right ventricular systolic pressure is 36.9 mmHg.   3. Left atrial size was mild to moderately dilated.   4. The mitral valve is normal in structure. No evidence of mitral valve  regurgitation. No evidence of mitral stenosis.   5. The aortic valve is normal in structure. Aortic valve regurgitation is  trivial. No aortic stenosis is present.   6. The inferior vena cava is dilated in size with >50% respiratory  variability, suggesting right atrial pressure of 8 mmHg.   Risk Assessment/Calculations:              Physical Exam:   VS:  BP 100/78 (BP Location: Left Arm, Patient Position: Sitting, Cuff Size: Normal)   Pulse 72   Ht 5' 5 (1.651 m)   Wt 173 lb (78.5 kg)   BMI 28.79 kg/m    Wt Readings from Last 3 Encounters:  08/01/24 173 lb (78.5 kg)  05/27/24 182 lb (82.6 kg)  05/21/24 180 lb (81.6 kg)    GEN: Well nourished, well developed in no acute distress NECK: No JVD; No carotid bruits CARDIAC: RRR, no murmurs, rubs, gallops RESPIRATORY:  Clear to auscultation without rales, wheezing or rhonchi  ABDOMEN: Soft, non-tender, non-distended EXTREMITIES:  No edema; No acute deformity      Assessment and Plan:  Coronary artery disease CT cardiac scoring 05/2024 with CAC of 313 (77th percentile) Lexiscan  Myoview  05/2024 was normal without  ischemia or infarction - Today she is stable without chest pains.  She denies exertional symptoms.  No symptoms to suggest active angina.  No indication for ischemic evaluation at this time - Today we again discussed initiation of statin therapy given evidence of coronary calcification.  She politely deferred at this time but stated she would likely reconsider after her upcoming shoulder procedure   Hyperlipidemia, LDL goal <70 LDL 102 not well-controlled - As further discussed above patient politely deferred statin therapy today   COPD No recent exacerbations - Managed per pulmonology   Lower extremity swelling Echocardiogram 07/2024 showed LVEF 65 to 70%, no RWMA, indeterminate diastolic parameters, RV function low normal, RV size mildly enlarged, normal PASP - Lower extremity edema has entirely resolved since initiation of loop diuretic therapy - Encouraged low-sodium dieting, leg elevation, and compression stockings - Plan to transition furosemide  to 40 mg daily as needed  Mitral valve regurgitation Aortic valve regurgitation Echocardiogram 07/2024 showed mild mitral valve regurgitation and mild aortic valve regurgitation - She remains asymptomatic today.  There are no further interventions warranted at this time   Preoperative cardiovascular exam Right reverse total shoulder arthroplasty on date  TBD with [facility]  Recent stress test was without ischemia or infarction and low risk  According to the Revised Cardiac Risk Index (RCRI), her Perioperative Risk of Major Cardiac Event is (%): 0.4. Her Functional Capacity in METs is: 5.07 according to the Duke Activity Status Index (DASI). Based on ACC/AHA guidelines, patient would be at acceptable risk for the planned procedure without further cardiovascular testing. I will route this recommendation to the requesting party via Epic fax function.       Dispo:  Return in about 6 months (around 01/29/2025).  Signed, Lum LITTIE Louis,  NP  "

## 2024-08-01 NOTE — Patient Instructions (Addendum)
 Medication Instructions:  START TAKING LASIX  AS NEEDED.  Lab Work: NONE TO BE DONE TODAY.  Testing/Procedures: NONE  Follow-Up: At [facility], you and your health needs are our priority.  As part of our continuing mission to provide you with exceptional heart care, our providers are all part of one team.  This team includes your primary Cardiologist (physician) and Advanced Practice Providers or APPs (Physician Assistants and Nurse Practitioners) who all work together to provide you with the care you need, when you need it.  Your next appointment:   6 MONTHS  Provider:   Darryle ONEIDA Decent, MD OR Lum Louis, [place]   Other Instructions:

## 2024-08-06 NOTE — Progress Notes (Signed)
 Date of COVID positive in last 90 days:  PCP - Duwaine Portugal, DO Cardiologist - Darryle Decent, MD Pulmonologist - Dorn Chill, MD  Cardiac clearance in Epic dated 08-01-24  Chest x-ray - 1V 12-25-23 Epic EKG - 05-21-24 Epic Stress Test - 05-26-24 Epic ECHO - 07-08-24 Epic Cardiac Cath - N/A Cardiac CT - 05-22-24 Epic Pacemaker/ICD device last checked:N/A Spinal Cord Stimulator:N/A  Bowel Prep - N/A  Sleep Study -  CPAP -   Fasting Blood Sugar - N/A Checks Blood Sugar _____ times a day  Last dose of GLP1 agonist-  N/A GLP1 instructions:  Do not take after     Last dose of SGLT-2 inhibitors-  N/A SGLT-2 instructions:  Do not take after     Blood Thinner Instructions: N/A Last dose:   Time: Aspirin Instructions:N/A Last Dose:  Activity level:  Can go up a flight of stairs and perform activities of daily living without stopping and without symptoms of chest pain or shortness of breath.  Able to exercise without symptoms  Unable to go up a flight of stairs without symptoms of     Anesthesia review: COPD, mitral regurg, aortic regurg  Patient denies shortness of breath, fever, cough and chest pain at PAT appointment  Patient verbalized understanding of instructions that were given to them at the PAT appointment. Patient was also instructed that they will need to review over the PAT instructions again at home before surgery.

## 2024-08-06 NOTE — Patient Instructions (Signed)
 SURGICAL WAITING ROOM VISITATION Patients having surgery or a procedure may have no more than 2 support people in the waiting area - these visitors may rotate.    Children under the age of 55 will not be allowed to visit due to the increase in respiratory illness  Children under the age of 51 must have an adult with them who is not the patient.  If the patient needs to stay at the hospital during part of their recovery, the visitor guidelines for inpatient rooms apply. Pre-op nurse will coordinate an appropriate time for 1 support person to accompany patient in pre-op.  This support person may not rotate.    Please refer to the [facility] website for the visitor guidelines for Inpatients (after your surgery is over and you are in a regular room).       Your procedure is scheduled on: 08-15-24   Report to [facility] Main Entrance    Report to admitting at 9:45 AM   Call this number if you have problems the morning of surgery [phone]   Do not eat food :After Midnight.   After Midnight you may have the following liquids until 9:15 AM DAY OF SURGERY  Water Non-Citrus Juices (without pulp, NO RED-Apple, White grape, White cranberry) Black Coffee (NO MILK/CREAM OR CREAMERS, sugar ok)  Clear Tea (NO MILK/CREAM OR CREAMERS, sugar ok) regular and decaf                             Plain Jell-O (NO RED)                                           Fruit ices (not with fruit pulp, NO RED)                                     Popsicles (NO RED)                                                               Sports drinks like Gatorade (NO RED)                   The day of surgery:  Drink ONE (1) Pre-Surgery Clear Ensure or G2 by 9:15 AM the morning of surgery. Drink in one sitting. Do not sip.  This drink was given to you during your hospital  pre-op appointment visit. Nothing else to drink after completing the Pre-Surgery Clear Ensure or G2.          If you have questions,  please contact your surgeons office.   FOLLOW ANY ADDITIONAL PRE OP INSTRUCTIONS YOU RECEIVED FROM YOUR SURGEON'S OFFICE!!!     Oral Hygiene is also important to reduce your risk of infection.                                    Remember - BRUSH YOUR TEETH THE MORNING OF SURGERY WITH YOUR REGULAR TOOTHPASTE   Do NOT smoke  after Midnight   Take these medicines the morning of surgery with A SIP OF WATER:    Pantoprazole  (Protonix )   Okay to use inhalers and nasal spray   Tramadol  if needed  Stop all vitamins and herbal supplements 7 days before surgery  Bring CPAP mask and tubing day of surgery.                              You may not have any metal on your body including hair pins, jewelry, and body piercing             Do not wear make-up, lotions, powders, perfumes, or deodorant  Do not wear nail polish including gel and S&S, artificial/acrylic nails, or any other type of covering on natural nails including finger and toenails. If you have artificial nails, gel coating, etc. that needs to be removed by a nail salon please have this removed prior to surgery or surgery may need to be canceled/ delayed if the surgeon/ anesthesia feels like they are unable to be safely monitored.   Do not shave  48 hours prior to surgery.          Do not bring valuables to the hospital. [facility] IS NOT RESPONSIBLE   FOR VALUABLES.   Contacts, dentures or bridgework may not be worn into surgery.   Bring small overnight bag day of surgery.   DO NOT BRING YOUR HOME MEDICATIONS TO THE HOSPITAL. PHARMACY WILL DISPENSE MEDICATIONS LISTED ON YOUR MEDICATION LIST TO YOU DURING YOUR ADMISSION IN THE HOSPITAL!    Special Instructions: Bring a copy of your healthcare power of attorney and living will documents the day of surgery if you haven't scanned them before.              Please read over the following fact sheets you were given: IF YOU HAVE QUESTIONS ABOUT YOUR PRE-OP INSTRUCTIONS PLEASE CALL  [phone] G[place]  If you received a COVID test during your pre-op visit  it is requested that you wear a mask when out in public, stay away from anyone that may not be feeling well and notify your surgeon if you develop symptoms. If you test positive for Covid or have been in contact with anyone that has tested positive in the last 10 days please notify you surgeon.   [place]- Preparing for Total Shoulder Arthroplasty    Before surgery, you can play an important role. Because skin is not sterile, your skin needs to be as free of germs as possible. You can reduce the number of germs on your skin by using the following products. Benzoyl Peroxide Gel Reduces the number of germs present on the skin Applied twice a day to shoulder area starting two days before surgery    ==================================================================  Please follow these instructions carefully:  BENZOYL PEROXIDE 5% GEL  Please do not use if you have an allergy to benzoyl peroxide.   If your skin becomes reddened/irritated stop using the benzoyl peroxide.  Starting two days before surgery, apply as follows: Apply benzoyl peroxide in the morning and at night. Apply after taking a shower. If you are not taking a shower clean entire shoulder front, back, and side along with the armpit with a clean wet washcloth.  Place a quarter-sized dollop on your shoulder and rub in thoroughly, making sure to cover the front, back, and side of your shoulder, along with the armpit.   2  days before (08-13-24)____ AM   ____ PM              1 day before (08-14-24)____ AM   ____ PM                         Do this twice a day for two days.  (Last application is the night before surgery, AFTER using the CHG soap as described below).  Do NOT apply benzoyl peroxide gel on the day of surgery.     Pre-operative 4 CHG Bath Instructions  DYNA-Hex 4 Chlorhexidine  Gluconate 4% Solution Antiseptic 4 fl. oz   You can play a key  role in reducing the risk of infection after surgery. Your skin needs to be as free of germs as possible. You can reduce the number of germs on your skin by washing with CHG (chlorhexidine  gluconate) soap before surgery. CHG is an antiseptic soap that kills germs and continues to kill germs even after washing.   DO NOT use if you have an allergy to chlorhexidine /CHG or antibacterial soaps. If your skin becomes reddened or irritated, stop using the CHG and notify one of our RNs at   Please shower with the CHG soap starting 4 days before surgery using the following schedule:     Please keep in mind the following:  DO NOT shave, including legs and underarms, starting the day of your first shower.   You may shave your face at any point before/day of surgery.  Place clean sheets on your bed the day you start using CHG soap. Use a clean washcloth (not used since being washed) for each shower. DO NOT sleep with pets once you start using the CHG.  CHG Shower Instructions:  If you choose to wash your hair and private area, wash first with your normal shampoo/soap.  After you use shampoo/soap, rinse your hair and body thoroughly to remove shampoo/soap residue.  Turn the water OFF and apply about 3 tablespoons (45 ml) of CHG soap to a CLEAN washcloth.  Apply CHG soap ONLY FROM YOUR NECK DOWN TO YOUR TOES (washing for 3-5 minutes)  DO NOT use CHG soap on face, private areas, open wounds, or sores.  Pay special attention to the area where your surgery is being performed.  If you are having back surgery, having someone wash your back for you may be helpful. Wait 2 minutes after CHG soap is applied, then you may rinse off the CHG soap.  Pat dry with a clean towel  Put on clean clothes/pajamas   If you choose to wear lotion, please use ONLY the CHG-compatible lotions on the back of this paper.     Additional instructions for the day of surgery:  Shower with regular soap the day of surgery DO NOT APPLY  any lotions, deodorants, cologne, or perfumes.   Put on clean/comfortable clothes.  Brush your teeth.  Ask your nurse before applying any prescription medications to the skin.   CHG Compatible Lotions   Aveeno Moisturizing lotion  Cetaphil Moisturizing Cream  Cetaphil Moisturizing Lotion  Clairol Herbal Essence Moisturizing Lotion, Dry Skin  Clairol Herbal Essence Moisturizing Lotion, Extra Dry Skin  Clairol Herbal Essence Moisturizing Lotion, Normal Skin  Curel Age Defying Therapeutic Moisturizing Lotion with Alpha Hydroxy  Curel Extreme Care Body Lotion  Curel Soothing Hands Moisturizing Hand Lotion  Curel Therapeutic Moisturizing Cream, Fragrance-Free  Curel Therapeutic Moisturizing Lotion, Fragrance-Free  Curel Therapeutic Moisturizing Lotion, Original Formula  Eucerin  Daily Replenishing Lotion  Eucerin Dry Skin Therapy Plus Alpha Hydroxy Crme  Eucerin Dry Skin Therapy Plus Alpha Hydroxy Lotion  Eucerin Original Crme  Eucerin Original Lotion  Eucerin Plus Crme Eucerin Plus Lotion  Eucerin TriLipid Replenishing Lotion  Keri Anti-Bacterial Hand Lotion  Keri Deep Conditioning Original Lotion Dry Skin Formula Softly Scented  Keri Deep Conditioning Original Lotion, Fragrance Free Sensitive Skin Formula  Keri Lotion Fast Absorbing Fragrance Free Sensitive Skin Formula  Keri Lotion Fast Absorbing Softly Scented Dry Skin Formula  Keri Original Lotion  Keri Skin Renewal Lotion Keri Silky Smooth Lotion  Keri Silky Smooth Sensitive Skin Lotion  Nivea Body Creamy Conditioning Oil  Nivea Body Extra Enriched Lotion  Nivea Body Original Lotion  Nivea Body Sheer Moisturizing Lotion Nivea Crme  Nivea Skin Firming Lotion  NutraDerm 30 Skin Lotion  NutraDerm Skin Lotion  NutraDerm Therapeutic Skin Cream  NutraDerm Therapeutic Skin Lotion  ProShield Protective Hand Cream  Provon moisturizing lotion   PATIENT SIGNATURE_________________________________  NURSE  SIGNATURE__________________________________  ________________________________________________________________________    Nasario Exon  An incentive spirometer is a tool that can help keep your lungs clear and active. This tool measures how well you are filling your lungs with each breath. Taking long deep breaths may help reverse or decrease the chance of developing breathing (pulmonary) problems (especially infection) following: A long period of time when you are unable to move or be active. BEFORE THE PROCEDURE  If the spirometer includes an indicator to show your best effort, your nurse or respiratory therapist will set it to a desired goal. If possible, sit up straight or lean slightly forward. Try not to slouch. Hold the incentive spirometer in an upright position. INSTRUCTIONS FOR USE  Sit on the edge of your bed if possible, or sit up as far as you can in bed or on a chair. Hold the incentive spirometer in an upright position. Breathe out normally. Place the mouthpiece in your mouth and seal your lips tightly around it. Breathe in slowly and as deeply as possible, raising the piston or the ball toward the top of the column. Hold your breath for 3-5 seconds or for as long as possible. Allow the piston or ball to fall to the bottom of the column. Remove the mouthpiece from your mouth and breathe out normally. Rest for a few seconds and repeat Steps 1 through 7 at least 10 times every 1-2 hours when you are awake. Take your time and take a few normal breaths between deep breaths. The spirometer may include an indicator to show your best effort. Use the indicator as a goal to work toward during each repetition. After each set of 10 deep breaths, practice coughing to be sure your lungs are clear. If you have an incision (the cut made at the time of surgery), support your incision when coughing by placing a pillow or rolled up towels firmly against it. Once you are able to get out of  bed, walk around indoors and cough well. You may stop using the incentive spirometer when instructed by your caregiver.  RISKS AND COMPLICATIONS Take your time so you do not get dizzy or light-headed. If you are in pain, you may need to take or ask for pain medication before doing incentive spirometry. It is harder to take a deep breath if you are having pain. AFTER USE Rest and breathe slowly and easily. It can be helpful to keep track of a log of your progress. Your caregiver can provide you  with a simple table to help with this. If you are using the spirometer at home, follow these instructions: SEEK MEDICAL CARE IF:  You are having difficultly using the spirometer. You have trouble using the spirometer as often as instructed. Your pain medication is not giving enough relief while using the spirometer. You develop fever of 100.5 F (38.1 C) or higher. SEEK IMMEDIATE MEDICAL CARE IF:  You cough up bloody sputum that had not been present before. You develop fever of 102 F (38.9 C) or greater. You develop worsening pain at or near the incision site. MAKE SURE YOU:  Understand these instructions. Will watch your condition. Will get help right away if you are not doing well or get worse. Document Released: 10/30/2006 Document Revised: 09/11/2011 Document Reviewed: 12/31/2006 [facility] Patient Information 2014 [place], [place].

## 2024-08-07 ENCOUNTER — Encounter (HOSPITAL_COMMUNITY)
Admission: RE | Admit: 2024-08-07 | Discharge: 2024-08-07 | Disposition: A | Source: Ambulatory Visit | Attending: Orthopedic Surgery

## 2024-08-07 ENCOUNTER — Encounter (HOSPITAL_COMMUNITY): Payer: Self-pay

## 2024-08-07 ENCOUNTER — Other Ambulatory Visit: Payer: Self-pay

## 2024-08-07 VITALS — Ht 66.0 in | Wt 174.0 lb

## 2024-08-07 DIAGNOSIS — Z01818 Encounter for other preprocedural examination: Secondary | ICD-10-CM

## 2024-08-07 DIAGNOSIS — D649 Anemia, unspecified: Secondary | ICD-10-CM

## 2024-08-07 DIAGNOSIS — I2510 Atherosclerotic heart disease of native coronary artery without angina pectoris: Secondary | ICD-10-CM

## 2024-08-08 ENCOUNTER — Encounter (HOSPITAL_COMMUNITY): Admission: RE | Admit: 2024-08-08 | Source: Ambulatory Visit

## 2024-08-08 DIAGNOSIS — I2510 Atherosclerotic heart disease of native coronary artery without angina pectoris: Secondary | ICD-10-CM

## 2024-08-08 DIAGNOSIS — Z01818 Encounter for other preprocedural examination: Secondary | ICD-10-CM

## 2024-08-08 DIAGNOSIS — D649 Anemia, unspecified: Secondary | ICD-10-CM

## 2024-08-08 LAB — CBC
HCT: 36.8 % (ref 36.0–46.0)
Hemoglobin: 11.2 g/dL — ABNORMAL LOW (ref 12.0–15.0)
MCH: 28.6 pg (ref 26.0–34.0)
MCHC: 30.4 g/dL (ref 30.0–36.0)
MCV: 94.1 fL (ref 80.0–100.0)
Platelets: 241 10*3/uL (ref 150–400)
RBC: 3.91 MIL/uL (ref 3.87–5.11)
RDW: 14.1 % (ref 11.5–15.5)
WBC: 4.8 10*3/uL (ref 4.0–10.5)
nRBC: 0 % (ref 0.0–0.2)

## 2024-08-08 LAB — BASIC METABOLIC PANEL WITH GFR
Anion gap: 7 (ref 5–15)
BUN: 14 mg/dL (ref 8–23)
CO2: 31 mmol/L (ref 22–32)
Calcium: 9.2 mg/dL (ref 8.9–10.3)
Chloride: 100 mmol/L (ref 98–111)
Creatinine, Ser: 0.56 mg/dL (ref 0.44–1.00)
GFR, Estimated: >60 mL/min
Glucose, Bld: 111 mg/dL — ABNORMAL HIGH (ref 70–99)
Potassium: 4.3 mmol/L (ref 3.5–5.1)
Sodium: 137 mmol/L (ref 135–145)

## 2024-08-08 LAB — SURGICAL PCR SCREEN
MRSA, PCR: NEGATIVE
Staphylococcus aureus: NEGATIVE

## 2024-08-15 ENCOUNTER — Encounter (HOSPITAL_COMMUNITY): Admission: RE | Payer: Self-pay | Source: Ambulatory Visit

## 2024-08-15 ENCOUNTER — Ambulatory Visit (HOSPITAL_COMMUNITY): Admission: RE | Admit: 2024-08-15 | Source: Ambulatory Visit | Admitting: Orthopedic Surgery

## 2024-08-15 SURGERY — ARTHROPLASTY, SHOULDER, TOTAL, REVERSE
Anesthesia: Choice | Site: Shoulder | Laterality: Right

## 2024-12-01 ENCOUNTER — Ambulatory Visit: Admitting: Pulmonary Disease
